# Patient Record
Sex: Male | Born: 1945
Health system: Southern US, Community
[De-identification: ages and names within clinical notes are randomized; demographics above are authoritative.]

## PROBLEM LIST (undated history)

## (undated) DIAGNOSIS — K219 Gastro-esophageal reflux disease without esophagitis: Secondary | ICD-10-CM

## (undated) DIAGNOSIS — E119 Type 2 diabetes mellitus without complications: Secondary | ICD-10-CM

## (undated) DIAGNOSIS — E785 Hyperlipidemia, unspecified: Secondary | ICD-10-CM

## (undated) DIAGNOSIS — I10 Essential (primary) hypertension: Secondary | ICD-10-CM

## (undated) HISTORY — DX: Type 2 diabetes mellitus without complications: E11.9

## (undated) HISTORY — PX: CARDIAC CATHETERIZATION: SHX172

## (undated) HISTORY — DX: Essential (primary) hypertension: I10

## (undated) HISTORY — PX: TONSILLECTOMY: SUR1361

## (undated) HISTORY — PX: HERNIA REPAIR: SHX51

## (undated) HISTORY — DX: Hyperlipidemia, unspecified: E78.5

## (undated) HISTORY — DX: Gastro-esophageal reflux disease without esophagitis: K21.9

## (undated) SURGERY — Surgical Case
Anesthesia: *Unknown

---

## 2005-02-03 ENCOUNTER — Ambulatory Visit: Payer: Self-pay | Admitting: Unknown Physician Specialty

## 2005-10-03 ENCOUNTER — Emergency Department: Payer: Self-pay | Admitting: Emergency Medicine

## 2006-07-28 ENCOUNTER — Emergency Department: Payer: Self-pay | Admitting: Emergency Medicine

## 2006-12-09 ENCOUNTER — Ambulatory Visit: Payer: Self-pay | Admitting: Cardiology

## 2007-05-05 ENCOUNTER — Ambulatory Visit: Payer: Self-pay | Admitting: Family Medicine

## 2007-10-17 ENCOUNTER — Ambulatory Visit: Payer: Self-pay | Admitting: Internal Medicine

## 2007-10-19 ENCOUNTER — Ambulatory Visit: Payer: Self-pay | Admitting: Internal Medicine

## 2008-11-28 ENCOUNTER — Ambulatory Visit: Payer: Self-pay | Admitting: Family Medicine

## 2012-04-19 DIAGNOSIS — Z79899 Other long term (current) drug therapy: Secondary | ICD-10-CM | POA: Insufficient documentation

## 2012-04-19 DIAGNOSIS — E291 Testicular hypofunction: Secondary | ICD-10-CM | POA: Insufficient documentation

## 2012-04-19 DIAGNOSIS — R3129 Other microscopic hematuria: Secondary | ICD-10-CM | POA: Insufficient documentation

## 2012-04-19 DIAGNOSIS — R972 Elevated prostate specific antigen [PSA]: Secondary | ICD-10-CM | POA: Insufficient documentation

## 2012-04-19 DIAGNOSIS — D075 Carcinoma in situ of prostate: Secondary | ICD-10-CM | POA: Insufficient documentation

## 2012-10-18 DIAGNOSIS — N5319 Other ejaculatory dysfunction: Secondary | ICD-10-CM | POA: Insufficient documentation

## 2012-10-18 DIAGNOSIS — N5235 Erectile dysfunction following radiation therapy: Secondary | ICD-10-CM | POA: Insufficient documentation

## 2012-11-09 ENCOUNTER — Ambulatory Visit: Payer: Self-pay | Admitting: Physician Assistant

## 2012-12-31 ENCOUNTER — Ambulatory Visit: Payer: Self-pay | Admitting: Oncology

## 2013-01-04 ENCOUNTER — Ambulatory Visit: Payer: Self-pay | Admitting: Oncology

## 2013-01-04 LAB — CBC CANCER CENTER
Eosinophil #: 0.3 x10 3/mm (ref 0.0–0.7)
HCT: 54.9 % — ABNORMAL HIGH (ref 40.0–52.0)
HGB: 18.4 g/dL — ABNORMAL HIGH (ref 13.0–18.0)
MCHC: 33.6 g/dL (ref 32.0–36.0)
MCV: 81 fL (ref 80–100)
Monocyte %: 8.7 %
Neutrophil #: 4.1 x10 3/mm (ref 1.4–6.5)
RDW: 14.3 % (ref 11.5–14.5)
WBC: 7.2 x10 3/mm (ref 3.8–10.6)

## 2013-01-04 LAB — IRON AND TIBC
Iron Bind.Cap.(Total): 398 ug/dL (ref 250–450)
Iron Saturation: 33 %
Iron: 132 ug/dL (ref 65–175)

## 2013-01-04 LAB — FERRITIN: Ferritin (ARMC): 54 ng/mL (ref 8–388)

## 2013-02-02 ENCOUNTER — Ambulatory Visit: Payer: Self-pay | Admitting: Oncology

## 2014-01-31 ENCOUNTER — Ambulatory Visit: Payer: Self-pay | Admitting: Oncology

## 2014-02-03 ENCOUNTER — Ambulatory Visit: Payer: Self-pay | Admitting: Oncology

## 2014-02-03 LAB — CBC CANCER CENTER
Basophil #: 0 x10 3/mm (ref 0.0–0.1)
Basophil %: 0.7 %
EOS PCT: 4.6 %
Eosinophil #: 0.3 x10 3/mm (ref 0.0–0.7)
HCT: 56.2 % — AB (ref 40.0–52.0)
HGB: 17.8 g/dL (ref 13.0–18.0)
LYMPHS PCT: 34.8 %
Lymphocyte #: 2.1 x10 3/mm (ref 1.0–3.6)
MCH: 25.7 pg — AB (ref 26.0–34.0)
MCHC: 31.8 g/dL — AB (ref 32.0–36.0)
MCV: 81 fL (ref 80–100)
Monocyte #: 0.6 x10 3/mm (ref 0.2–1.0)
Monocyte %: 10 %
NEUTROS ABS: 3.1 x10 3/mm (ref 1.4–6.5)
Neutrophil %: 49.9 %
Platelet: 185 x10 3/mm (ref 150–440)
RBC: 6.94 10*6/uL — AB (ref 4.40–5.90)
RDW: 14.5 % (ref 11.5–14.5)
WBC: 6.2 x10 3/mm (ref 3.8–10.6)

## 2014-02-03 LAB — IRON AND TIBC
Iron Bind.Cap.(Total): 370 ug/dL (ref 250–450)
Iron Saturation: 19 %
Iron: 72 ug/dL (ref 65–175)
Unbound Iron-Bind.Cap.: 298 ug/dL

## 2014-02-03 LAB — FERRITIN: Ferritin (ARMC): 35 ng/mL (ref 8–388)

## 2014-03-05 ENCOUNTER — Ambulatory Visit: Payer: Self-pay | Admitting: Oncology

## 2014-03-24 LAB — CBC CANCER CENTER
Basophil #: 0 x10 3/mm (ref 0.0–0.1)
Basophil %: 0.8 %
EOS PCT: 4 %
Eosinophil #: 0.2 x10 3/mm (ref 0.0–0.7)
HCT: 50.6 % (ref 40.0–52.0)
HGB: 16.4 g/dL (ref 13.0–18.0)
LYMPHS ABS: 1.9 x10 3/mm (ref 1.0–3.6)
Lymphocyte %: 38.3 %
MCH: 26.5 pg (ref 26.0–34.0)
MCHC: 32.5 g/dL (ref 32.0–36.0)
MCV: 82 fL (ref 80–100)
Monocyte #: 0.4 x10 3/mm (ref 0.2–1.0)
Monocyte %: 8.4 %
NEUTROS ABS: 2.4 x10 3/mm (ref 1.4–6.5)
Neutrophil %: 48.5 %
Platelet: 149 x10 3/mm — ABNORMAL LOW (ref 150–440)
RBC: 6.21 10*6/uL — ABNORMAL HIGH (ref 4.40–5.90)
RDW: 15.4 % — ABNORMAL HIGH (ref 11.5–14.5)
WBC: 4.9 x10 3/mm (ref 3.8–10.6)

## 2014-04-04 ENCOUNTER — Ambulatory Visit: Payer: Self-pay | Admitting: Oncology

## 2014-08-03 ENCOUNTER — Ambulatory Visit
Admit: 2014-08-03 | Disposition: A | Discharge status: Home / Self Care | Payer: Self-pay | Attending: Oncology | Admitting: Oncology

## 2014-08-03 LAB — CBC CANCER CENTER
Basophil #: 0.1 x10 3/mm (ref 0.0–0.1)
Basophil %: 1.2 %
EOS ABS: 0.2 x10 3/mm (ref 0.0–0.7)
EOS PCT: 3.8 %
HCT: 45.2 % (ref 40.0–52.0)
HGB: 15.5 g/dL (ref 13.0–18.0)
Lymphocyte #: 2.2 x10 3/mm (ref 1.0–3.6)
Lymphocyte %: 39.6 %
MCH: 27.9 pg (ref 26.0–34.0)
MCHC: 34.2 g/dL (ref 32.0–36.0)
MCV: 82 fL (ref 80–100)
MONOS PCT: 8.9 %
Monocyte #: 0.5 x10 3/mm (ref 0.2–1.0)
NEUTROS ABS: 2.6 x10 3/mm (ref 1.4–6.5)
Neutrophil %: 46.5 %
Platelet: 200 x10 3/mm (ref 150–440)
RBC: 5.55 10*6/uL (ref 4.40–5.90)
RDW: 13.8 % (ref 11.5–14.5)
WBC: 5.5 x10 3/mm (ref 3.8–10.6)

## 2014-08-04 ENCOUNTER — Ambulatory Visit
Admit: 2014-08-04 | Disposition: A | Discharge status: Home / Self Care | Payer: Self-pay | Attending: Oncology | Admitting: Oncology

## 2015-02-16 DIAGNOSIS — R972 Elevated prostate specific antigen [PSA]: Secondary | ICD-10-CM | POA: Diagnosis not present

## 2015-02-16 DIAGNOSIS — N4231 Prostatic intraepithelial neoplasia: Secondary | ICD-10-CM | POA: Diagnosis not present

## 2015-02-16 DIAGNOSIS — R3121 Asymptomatic microscopic hematuria: Secondary | ICD-10-CM | POA: Diagnosis not present

## 2015-02-16 DIAGNOSIS — Z79899 Other long term (current) drug therapy: Secondary | ICD-10-CM | POA: Diagnosis not present

## 2015-02-16 DIAGNOSIS — Z86008 Personal history of in-situ neoplasm of other site: Secondary | ICD-10-CM | POA: Diagnosis not present

## 2015-02-16 DIAGNOSIS — E291 Testicular hypofunction: Secondary | ICD-10-CM | POA: Diagnosis not present

## 2015-02-16 DIAGNOSIS — N529 Male erectile dysfunction, unspecified: Secondary | ICD-10-CM | POA: Diagnosis not present

## 2015-02-19 DIAGNOSIS — Z23 Encounter for immunization: Secondary | ICD-10-CM | POA: Diagnosis not present

## 2015-02-19 DIAGNOSIS — E1165 Type 2 diabetes mellitus with hyperglycemia: Secondary | ICD-10-CM | POA: Diagnosis not present

## 2015-02-19 DIAGNOSIS — I119 Hypertensive heart disease without heart failure: Secondary | ICD-10-CM | POA: Diagnosis not present

## 2015-02-20 DIAGNOSIS — I119 Hypertensive heart disease without heart failure: Secondary | ICD-10-CM | POA: Diagnosis not present

## 2015-02-20 DIAGNOSIS — I517 Cardiomegaly: Secondary | ICD-10-CM | POA: Diagnosis not present

## 2015-02-20 DIAGNOSIS — M109 Gout, unspecified: Secondary | ICD-10-CM | POA: Diagnosis not present

## 2015-03-06 DIAGNOSIS — M109 Gout, unspecified: Secondary | ICD-10-CM | POA: Diagnosis not present

## 2015-03-06 DIAGNOSIS — E1159 Type 2 diabetes mellitus with other circulatory complications: Secondary | ICD-10-CM | POA: Diagnosis not present

## 2015-03-06 DIAGNOSIS — I119 Hypertensive heart disease without heart failure: Secondary | ICD-10-CM | POA: Diagnosis not present

## 2015-03-06 DIAGNOSIS — G4710 Hypersomnia, unspecified: Secondary | ICD-10-CM | POA: Diagnosis not present

## 2015-03-06 DIAGNOSIS — D751 Secondary polycythemia: Secondary | ICD-10-CM | POA: Diagnosis not present

## 2015-03-09 DIAGNOSIS — E291 Testicular hypofunction: Secondary | ICD-10-CM | POA: Diagnosis not present

## 2015-04-23 DIAGNOSIS — E291 Testicular hypofunction: Secondary | ICD-10-CM | POA: Diagnosis not present

## 2015-07-11 DIAGNOSIS — E291 Testicular hypofunction: Secondary | ICD-10-CM | POA: Diagnosis not present

## 2015-07-11 DIAGNOSIS — M109 Gout, unspecified: Secondary | ICD-10-CM | POA: Diagnosis not present

## 2015-07-11 DIAGNOSIS — I119 Hypertensive heart disease without heart failure: Secondary | ICD-10-CM | POA: Diagnosis not present

## 2015-07-11 DIAGNOSIS — E1165 Type 2 diabetes mellitus with hyperglycemia: Secondary | ICD-10-CM | POA: Diagnosis not present

## 2015-08-15 DIAGNOSIS — R05 Cough: Secondary | ICD-10-CM | POA: Diagnosis not present

## 2015-08-15 DIAGNOSIS — I10 Essential (primary) hypertension: Secondary | ICD-10-CM | POA: Diagnosis not present

## 2015-08-15 DIAGNOSIS — J1089 Influenza due to other identified influenza virus with other manifestations: Secondary | ICD-10-CM | POA: Diagnosis not present

## 2015-08-15 DIAGNOSIS — J069 Acute upper respiratory infection, unspecified: Secondary | ICD-10-CM | POA: Diagnosis not present

## 2015-11-26 DIAGNOSIS — Z79899 Other long term (current) drug therapy: Secondary | ICD-10-CM | POA: Diagnosis not present

## 2015-11-26 DIAGNOSIS — R972 Elevated prostate specific antigen [PSA]: Secondary | ICD-10-CM | POA: Diagnosis not present

## 2015-11-26 DIAGNOSIS — E291 Testicular hypofunction: Secondary | ICD-10-CM | POA: Diagnosis not present

## 2015-12-10 DIAGNOSIS — R972 Elevated prostate specific antigen [PSA]: Secondary | ICD-10-CM | POA: Diagnosis not present

## 2015-12-10 DIAGNOSIS — D075 Carcinoma in situ of prostate: Secondary | ICD-10-CM | POA: Diagnosis not present

## 2015-12-14 DIAGNOSIS — R972 Elevated prostate specific antigen [PSA]: Secondary | ICD-10-CM | POA: Diagnosis not present

## 2015-12-14 DIAGNOSIS — D075 Carcinoma in situ of prostate: Secondary | ICD-10-CM | POA: Diagnosis not present

## 2016-01-17 DIAGNOSIS — I10 Essential (primary) hypertension: Secondary | ICD-10-CM | POA: Diagnosis not present

## 2016-01-17 DIAGNOSIS — E1165 Type 2 diabetes mellitus with hyperglycemia: Secondary | ICD-10-CM | POA: Diagnosis not present

## 2016-01-17 DIAGNOSIS — E291 Testicular hypofunction: Secondary | ICD-10-CM | POA: Diagnosis not present

## 2016-01-17 DIAGNOSIS — Z0001 Encounter for general adult medical examination with abnormal findings: Secondary | ICD-10-CM | POA: Diagnosis not present

## 2016-01-17 DIAGNOSIS — E782 Mixed hyperlipidemia: Secondary | ICD-10-CM | POA: Diagnosis not present

## 2016-01-17 DIAGNOSIS — D751 Secondary polycythemia: Secondary | ICD-10-CM | POA: Diagnosis not present

## 2016-01-17 DIAGNOSIS — I119 Hypertensive heart disease without heart failure: Secondary | ICD-10-CM | POA: Diagnosis not present

## 2016-02-12 DIAGNOSIS — R972 Elevated prostate specific antigen [PSA]: Secondary | ICD-10-CM | POA: Diagnosis not present

## 2016-02-12 DIAGNOSIS — C61 Malignant neoplasm of prostate: Secondary | ICD-10-CM | POA: Diagnosis not present

## 2016-02-20 DIAGNOSIS — I119 Hypertensive heart disease without heart failure: Secondary | ICD-10-CM | POA: Diagnosis not present

## 2016-02-21 DIAGNOSIS — N182 Chronic kidney disease, stage 2 (mild): Secondary | ICD-10-CM | POA: Diagnosis not present

## 2016-02-21 DIAGNOSIS — I119 Hypertensive heart disease without heart failure: Secondary | ICD-10-CM | POA: Diagnosis not present

## 2016-02-21 DIAGNOSIS — E782 Mixed hyperlipidemia: Secondary | ICD-10-CM | POA: Diagnosis not present

## 2016-02-21 DIAGNOSIS — E291 Testicular hypofunction: Secondary | ICD-10-CM | POA: Diagnosis not present

## 2016-02-21 DIAGNOSIS — M109 Gout, unspecified: Secondary | ICD-10-CM | POA: Diagnosis not present

## 2016-03-17 DIAGNOSIS — C61 Malignant neoplasm of prostate: Secondary | ICD-10-CM | POA: Diagnosis not present

## 2016-04-14 DIAGNOSIS — I10 Essential (primary) hypertension: Secondary | ICD-10-CM | POA: Diagnosis not present

## 2016-04-14 DIAGNOSIS — Z23 Encounter for immunization: Secondary | ICD-10-CM | POA: Diagnosis not present

## 2016-04-14 DIAGNOSIS — E119 Type 2 diabetes mellitus without complications: Secondary | ICD-10-CM | POA: Diagnosis not present

## 2016-04-14 DIAGNOSIS — M109 Gout, unspecified: Secondary | ICD-10-CM | POA: Diagnosis not present

## 2016-04-22 DIAGNOSIS — E119 Type 2 diabetes mellitus without complications: Secondary | ICD-10-CM | POA: Diagnosis not present

## 2016-04-22 DIAGNOSIS — K219 Gastro-esophageal reflux disease without esophagitis: Secondary | ICD-10-CM | POA: Diagnosis not present

## 2016-04-22 DIAGNOSIS — I10 Essential (primary) hypertension: Secondary | ICD-10-CM | POA: Diagnosis not present

## 2016-04-22 DIAGNOSIS — M109 Gout, unspecified: Secondary | ICD-10-CM | POA: Diagnosis not present

## 2016-04-22 DIAGNOSIS — M67442 Ganglion, left hand: Secondary | ICD-10-CM | POA: Diagnosis not present

## 2016-05-06 DIAGNOSIS — M67441 Ganglion, right hand: Secondary | ICD-10-CM | POA: Diagnosis not present

## 2016-05-06 DIAGNOSIS — M79644 Pain in right finger(s): Secondary | ICD-10-CM | POA: Diagnosis not present

## 2016-05-06 DIAGNOSIS — M1A0411 Idiopathic chronic gout, right hand, with tophus (tophi): Secondary | ICD-10-CM | POA: Diagnosis not present

## 2016-05-06 DIAGNOSIS — G8929 Other chronic pain: Secondary | ICD-10-CM | POA: Diagnosis not present

## 2016-06-04 DIAGNOSIS — M1A9XX1 Chronic gout, unspecified, with tophus (tophi): Secondary | ICD-10-CM | POA: Diagnosis not present

## 2016-06-04 DIAGNOSIS — M67441 Ganglion, right hand: Secondary | ICD-10-CM | POA: Diagnosis not present

## 2016-06-04 DIAGNOSIS — L942 Calcinosis cutis: Secondary | ICD-10-CM | POA: Diagnosis not present

## 2016-06-09 DIAGNOSIS — C61 Malignant neoplasm of prostate: Secondary | ICD-10-CM | POA: Diagnosis not present

## 2016-06-16 DIAGNOSIS — Z510 Encounter for antineoplastic radiation therapy: Secondary | ICD-10-CM | POA: Diagnosis not present

## 2016-06-16 DIAGNOSIS — C61 Malignant neoplasm of prostate: Secondary | ICD-10-CM | POA: Diagnosis not present

## 2016-06-17 DIAGNOSIS — Z510 Encounter for antineoplastic radiation therapy: Secondary | ICD-10-CM | POA: Diagnosis not present

## 2016-06-17 DIAGNOSIS — C61 Malignant neoplasm of prostate: Secondary | ICD-10-CM | POA: Diagnosis not present

## 2016-06-26 DIAGNOSIS — C61 Malignant neoplasm of prostate: Secondary | ICD-10-CM | POA: Insufficient documentation

## 2016-06-30 DIAGNOSIS — C61 Malignant neoplasm of prostate: Secondary | ICD-10-CM | POA: Diagnosis not present

## 2016-06-30 DIAGNOSIS — Z510 Encounter for antineoplastic radiation therapy: Secondary | ICD-10-CM | POA: Diagnosis not present

## 2016-07-01 DIAGNOSIS — C61 Malignant neoplasm of prostate: Secondary | ICD-10-CM | POA: Diagnosis not present

## 2016-07-01 DIAGNOSIS — Z510 Encounter for antineoplastic radiation therapy: Secondary | ICD-10-CM | POA: Diagnosis not present

## 2016-07-02 ENCOUNTER — Encounter: Payer: Self-pay | Admitting: Family Medicine

## 2016-07-02 DIAGNOSIS — C61 Malignant neoplasm of prostate: Secondary | ICD-10-CM | POA: Diagnosis not present

## 2016-07-02 DIAGNOSIS — Z510 Encounter for antineoplastic radiation therapy: Secondary | ICD-10-CM | POA: Diagnosis not present

## 2016-07-03 DIAGNOSIS — C61 Malignant neoplasm of prostate: Secondary | ICD-10-CM | POA: Diagnosis not present

## 2016-07-03 DIAGNOSIS — Z510 Encounter for antineoplastic radiation therapy: Secondary | ICD-10-CM | POA: Diagnosis not present

## 2016-07-24 DIAGNOSIS — D075 Carcinoma in situ of prostate: Secondary | ICD-10-CM | POA: Diagnosis not present

## 2016-07-24 DIAGNOSIS — C61 Malignant neoplasm of prostate: Secondary | ICD-10-CM | POA: Diagnosis not present

## 2016-07-24 DIAGNOSIS — Z510 Encounter for antineoplastic radiation therapy: Secondary | ICD-10-CM | POA: Diagnosis not present

## 2016-07-29 DIAGNOSIS — H2513 Age-related nuclear cataract, bilateral: Secondary | ICD-10-CM | POA: Diagnosis not present

## 2016-07-29 DIAGNOSIS — H524 Presbyopia: Secondary | ICD-10-CM | POA: Diagnosis not present

## 2016-07-29 DIAGNOSIS — H401112 Primary open-angle glaucoma, right eye, moderate stage: Secondary | ICD-10-CM | POA: Diagnosis not present

## 2016-08-04 DIAGNOSIS — E119 Type 2 diabetes mellitus without complications: Secondary | ICD-10-CM | POA: Diagnosis not present

## 2016-08-04 DIAGNOSIS — I10 Essential (primary) hypertension: Secondary | ICD-10-CM | POA: Diagnosis not present

## 2016-08-04 DIAGNOSIS — E782 Mixed hyperlipidemia: Secondary | ICD-10-CM | POA: Diagnosis not present

## 2016-08-04 DIAGNOSIS — M109 Gout, unspecified: Secondary | ICD-10-CM | POA: Diagnosis not present

## 2016-08-04 DIAGNOSIS — R609 Edema, unspecified: Secondary | ICD-10-CM | POA: Diagnosis not present

## 2016-10-01 DIAGNOSIS — H401112 Primary open-angle glaucoma, right eye, moderate stage: Secondary | ICD-10-CM | POA: Diagnosis not present

## 2016-11-13 DIAGNOSIS — M109 Gout, unspecified: Secondary | ICD-10-CM | POA: Diagnosis not present

## 2016-11-13 DIAGNOSIS — I10 Essential (primary) hypertension: Secondary | ICD-10-CM | POA: Diagnosis not present

## 2016-11-13 DIAGNOSIS — E119 Type 2 diabetes mellitus without complications: Secondary | ICD-10-CM | POA: Diagnosis not present

## 2016-11-13 DIAGNOSIS — K219 Gastro-esophageal reflux disease without esophagitis: Secondary | ICD-10-CM | POA: Diagnosis not present

## 2017-01-01 DIAGNOSIS — R39198 Other difficulties with micturition: Secondary | ICD-10-CM | POA: Diagnosis not present

## 2017-01-01 DIAGNOSIS — Z923 Personal history of irradiation: Secondary | ICD-10-CM | POA: Diagnosis not present

## 2017-01-01 DIAGNOSIS — R232 Flushing: Secondary | ICD-10-CM | POA: Diagnosis not present

## 2017-01-01 DIAGNOSIS — R195 Other fecal abnormalities: Secondary | ICD-10-CM | POA: Diagnosis not present

## 2017-01-01 DIAGNOSIS — C61 Malignant neoplasm of prostate: Secondary | ICD-10-CM | POA: Diagnosis not present

## 2017-01-01 DIAGNOSIS — R5383 Other fatigue: Secondary | ICD-10-CM | POA: Diagnosis not present

## 2017-01-01 DIAGNOSIS — Z79899 Other long term (current) drug therapy: Secondary | ICD-10-CM | POA: Diagnosis not present

## 2017-01-01 DIAGNOSIS — R3915 Urgency of urination: Secondary | ICD-10-CM | POA: Diagnosis not present

## 2017-02-25 ENCOUNTER — Encounter: Payer: Self-pay | Admitting: Urology

## 2017-02-25 ENCOUNTER — Ambulatory Visit: Payer: Medicare Other | Admitting: Urology

## 2017-02-25 VITALS — BP 145/81 | HR 65 | Ht 75.5 in | Wt 263.2 lb

## 2017-02-25 DIAGNOSIS — E291 Testicular hypofunction: Secondary | ICD-10-CM

## 2017-02-25 DIAGNOSIS — N529 Male erectile dysfunction, unspecified: Secondary | ICD-10-CM | POA: Diagnosis not present

## 2017-02-25 DIAGNOSIS — C61 Malignant neoplasm of prostate: Secondary | ICD-10-CM | POA: Diagnosis not present

## 2017-02-25 MED ORDER — TADALAFIL 5 MG PO TABS: 6.0000 mg | ORAL_TABLET | Freq: Every day | ORAL | 3 refills | Status: DC | PRN

## 2017-02-25 MED ORDER — SILDENAFIL CITRATE 20 MG PO TABS: ORAL_TABLET | ORAL | 0 refills | Status: DC

## 2017-02-25 NOTE — Progress Notes (Signed)
02/25/2017 12:26 PM   Gary Alar Sr. 05/10/1945 [id number]   Chief Complaint  Patient presents with  . Prostate Cancer    HPI: [name] is a 71 yo male with low-intermediate prostate cancer, cT1c, PSA 4.31 on finasteride (corrected 8.62), Gleason 3+4=7 in 1/12 cores (left apex) diagnosed in October 2017.  He was treated with Cyberknife radiation on 07/03/16.  He last saw a radiation oncology at [facility] on 01/01/2017 and PSA at that visit was <0.1.  Since his procedure he has had urinary frequency, urgency with occasional episodes of urge incontinence which he states is improving and not bothersome.  Denies dysuria or gross hematuria.  He is taking tamsulosin but no longer on finasteride.  He was on testosterone replacement therapy prior to his cancer diagnosis and complains of tiredness and fatigue.  He also been on low-dose tadalafil and low-dose sildenafil for erectile dysfunction which he was alternating.  PMH: History reviewed. No pertinent past medical history.  Surgical History: History reviewed. No pertinent surgical history.  Home Medications:  Allergies as of 02/25/2017   No Known Allergies     Medication List       Accurate as of 02/25/17 12:26 PM. Always use your most recent med list.          allopurinol 300 MG tablet Commonly known as:  ZYLOPRIM Take 1 tablet by mouth at bedtime.   amLODipine 10 MG tablet Commonly known as:  NORVASC Take 1 tablet by mouth at bedtime.   carvedilol 12.5 MG tablet Commonly known as:  COREG Take 12.5 mg by mouth 2 (two) times daily with a meal.   colchicine 0.6 MG tablet Take 0.6 mg by mouth daily.   furosemide 20 MG tablet Commonly known as:  LASIX Take 1 tablet by mouth as needed.   losartan-hydrochlorothiazide 100-12.5 MG tablet Commonly known as:  HYZAAR Take 1 tablet by mouth daily.   tamsulosin 0.4 MG Caps capsule Commonly known as:  FLOMAX Take 1 capsule by mouth daily.       Allergies: No Known  Allergies  Family History: Family History  Problem Relation Age of Onset  . Cirrhosis Father   . Bone cancer Mother     Social History:  reports that he has never smoked. He has never used smokeless tobacco. His alcohol and drug histories are not on file.  ROS: UROLOGY Frequent Urination?: Yes Hard to postpone urination?: No Burning/pain with urination?: No Get up at night to urinate?: Yes Leakage of urine?: Yes Urine stream starts and stops?: No Trouble starting stream?: Yes Do you have to strain to urinate?: No Blood in urine?: No Urinary tract infection?: No Sexually transmitted disease?: No Injury to kidneys or bladder?: No Painful intercourse?: No Weak stream?: Yes Erection problems?: Yes Penile pain?: No  Gastrointestinal Nausea?: Yes Vomiting?: No Indigestion/heartburn?: Yes Diarrhea?: No Constipation?: No  Constitutional Fever: No Night sweats?: No Weight loss?: No Fatigue?: No  Skin Skin rash/lesions?: No Itching?: No  Eyes Blurred vision?: No Double vision?: No  Ears/Nose/Throat Sore throat?: No Sinus problems?: No  Hematologic/Lymphatic Swollen glands?: No Easy bruising?: No  Cardiovascular Leg swelling?: No Chest pain?: No  Respiratory Cough?: No Shortness of breath?: No  Endocrine Excessive thirst?: No  Musculoskeletal Back pain?: No Joint pain?: No  Neurological Headaches?: No Dizziness?: No  Psychologic Depression?: No Anxiety?: No  Physical Exam: BP (!) 145/81 (BP Location: Right Arm, Patient Position: Sitting, Cuff Size: Large)   Pulse 65   Ht 6'  3.5" (1.918 m)   Wt 263 lb 3.2 oz (119.4 kg)   BMI 32.46 kg/m   Constitutional:  Alert and oriented, No acute distress. HEENT: [place] AT, moist mucus membranes.  Trachea midline, no masses. Cardiovascular: No clubbing, cyanosis, or edema. Respiratory: Normal respiratory effort, no increased work of breathing. GI: Abdomen is soft, nontender, nondistended, no abdominal  masses GU: No CVA tenderness.  Skin: No rashes, bruises or suspicious lesions. Lymph: No cervical or inguinal adenopathy. Neurologic: Grossly intact, no focal deficits, moving all 4 extremities. Psychiatric: Normal mood and affect.  Laboratory Data: Lab Results  Component Value Date   WBC 5.5 08/03/2014   HGB 15.5 08/03/2014   HCT 45.2 08/03/2014   MCV 82 08/03/2014   PLT 200 08/03/2014    Assessment & Plan:    1. Prostate cancer [facility]) Last PSA was late August 2018 and he will be due for a follow-up PSA in late November.  Recommend follow-up office visit February/March 2019.  - PSA  2. Erectile dysfunction Low dose sildenafil and tadalafil were refilled  3.  Hypogonadism Currently not a candidate for restarting testosterone replacement.  Will check his testosterone at that time of his PSA.  Return in about 3 months (around 05/28/2017) for Recheck and PSA.  [name], [place] [address] [place], [place] [address] [phone]

## 2017-03-11 ENCOUNTER — Other Ambulatory Visit: Payer: Self-pay

## 2017-03-11 MED ORDER — SILDENAFIL CITRATE 100 MG PO TABS: 100.0000 mg | ORAL_TABLET | Freq: Every day | ORAL | 0 refills | Status: DC | PRN

## 2017-03-11 MED ORDER — TADALAFIL 5 MG PO TABS: 6.0000 mg | ORAL_TABLET | Freq: Every day | ORAL | 3 refills | Status: DC | PRN

## 2017-03-23 ENCOUNTER — Other Ambulatory Visit: Payer: Self-pay

## 2017-03-23 MED ORDER — SILDENAFIL CITRATE 100 MG PO TABS: 100.0000 mg | ORAL_TABLET | Freq: Every day | ORAL | 6 refills | Status: DC | PRN

## 2017-04-01 ENCOUNTER — Other Ambulatory Visit: Payer: Medicare Other

## 2017-04-01 DIAGNOSIS — E291 Testicular hypofunction: Secondary | ICD-10-CM | POA: Diagnosis not present

## 2017-04-02 LAB — TESTOSTERONE: Testosterone: 63 ng/dL — ABNORMAL LOW (ref 264–916)

## 2017-04-03 ENCOUNTER — Telehealth: Payer: Self-pay

## 2017-04-03 NOTE — Telephone Encounter (Signed)
LMOM- testosterone 63. Not a candidate for replacement therapy.

## 2017-04-03 NOTE — Telephone Encounter (Signed)
-----   Message from [name], MD sent at 04/02/2017  9:50 AM EST ----- Testosterone level was 63.  With his recent prostate cancer treatment he is currently not a candidate for testosterone replacement.

## 2017-05-18 ENCOUNTER — Ambulatory Visit (INDEPENDENT_AMBULATORY_CARE_PROVIDER_SITE_OTHER): Payer: Medicare Other | Admitting: Nurse Practitioner

## 2017-05-18 ENCOUNTER — Encounter: Payer: Self-pay | Admitting: Nurse Practitioner

## 2017-05-18 VITALS — BP 150/82 | HR 61 | Resp 16 | Ht 75.5 in | Wt 262.0 lb

## 2017-05-18 DIAGNOSIS — Z23 Encounter for immunization: Secondary | ICD-10-CM | POA: Diagnosis not present

## 2017-05-18 DIAGNOSIS — Z0001 Encounter for general adult medical examination with abnormal findings: Secondary | ICD-10-CM

## 2017-05-18 DIAGNOSIS — E1165 Type 2 diabetes mellitus with hyperglycemia: Secondary | ICD-10-CM | POA: Diagnosis not present

## 2017-05-18 DIAGNOSIS — I10 Essential (primary) hypertension: Secondary | ICD-10-CM | POA: Insufficient documentation

## 2017-05-18 DIAGNOSIS — K219 Gastro-esophageal reflux disease without esophagitis: Secondary | ICD-10-CM | POA: Insufficient documentation

## 2017-05-18 DIAGNOSIS — J309 Allergic rhinitis, unspecified: Secondary | ICD-10-CM

## 2017-05-18 LAB — POCT GLYCOSYLATED HEMOGLOBIN (HGB A1C): Hemoglobin A1C: 7.5

## 2017-05-18 NOTE — Progress Notes (Signed)
[facility] [place], [place] [address]  Internal MEDICINE  Office Visit Note  Patient Name: [name]  [id number]  [id number]  Date of Service: 05/18/2017     Complaints/HPI Pt is here for routine health maintenance examination  Chief Complaint  Patient presents with  . Hoarse   Blood sugars a little elevated recently. States that he did not make such wise diet choices over the holiday season. He states that otherwise, he feels pretty good.    Other  This is a new problem. The current episode started in the past 7 days. The problem occurs constantly. The problem has been unchanged. Associated symptoms include congestion, coughing and fatigue. Pertinent negatives include no chest pain, chills, fever, headaches, nausea, numbness, sore throat, vomiting or weakness. Associated symptoms comments: Post-nasal drip. Nothing aggravates the symptoms. He has tried nothing for the symptoms. The treatment provided no relief.    Current Medication: Outpatient Encounter Medications as of 05/18/2017  Medication Sig  . allopurinol (ZYLOPRIM) 300 MG tablet Take 1 tablet by mouth at bedtime.  Marland Kitchen amLODipine (NORVASC) 10 MG tablet Take 1 tablet by mouth at bedtime.  . carvedilol (COREG) 12.5 MG tablet Take 12.5 mg by mouth 2 (two) times daily with a meal.  . colchicine 0.6 MG tablet Take 0.6 mg by mouth daily.  . furosemide (LASIX) 20 MG tablet Take 1 tablet by mouth as needed.  Marland Kitchen losartan-hydrochlorothiazide (HYZAAR) 100-12.5 MG tablet Take 1 tablet by mouth daily.  . tadalafil (CIALIS) 5 MG tablet Take 1 tablet (5 mg total) daily as needed by mouth for erectile dysfunction.  . tamsulosin (FLOMAX) 0.4 MG CAPS capsule Take 1 capsule by mouth daily.  . sildenafil (REVATIO) 20 MG tablet 1-3 tablets 1 hour prior to intercourse (Patient not taking: Reported on 05/18/2017)  . sildenafil (VIAGRA) 100 MG tablet Take 1 tablet (100 mg total) daily as needed by mouth for  erectile dysfunction. (Patient not taking: Reported on 05/18/2017)   No facility-administered encounter medications on file as of 05/18/2017.     Surgical History: Past Surgical History:  Procedure Laterality Date  . HERNIA REPAIR    . TONSILLECTOMY Bilateral    1992    Medical History: Past Medical History:  Diagnosis Date  . Diabetes mellitus without complication ([place])   . GERD (gastroesophageal reflux disease)   . Hyperlipidemia   . Hypertension     Family History: Family History  Problem Relation Age of Onset  . Cirrhosis Father   . Bone cancer Mother       Review of Systems  Constitutional: Positive for fatigue. Negative for activity change, appetite change, chills and fever.  HENT: Positive for congestion, postnasal drip and voice change. Negative for sore throat.   Eyes: Negative.   Respiratory: Positive for cough. Negative for shortness of breath and wheezing.   Cardiovascular: Negative for chest pain and palpitations.  Gastrointestinal: Negative for constipation, diarrhea, nausea and vomiting.  Endocrine:       Blood sugars have been elevated recently  Genitourinary:       Sees Dr. Bernardo Heater, urlogy, for history of prostate cancer.   Musculoskeletal: Negative for back pain.  Skin: Negative.   Allergic/Immunologic: Negative.   Neurological: Negative for weakness, numbness and headaches.  Hematological: Negative.   Psychiatric/Behavioral: Negative for behavioral problems. The patient is not nervous/anxious.      Today's Vitals   05/18/17 0922  BP: (!) 150/82  Pulse: 61  Resp: 16  SpO2:  96%  Weight: 262 lb (118.8 kg)  Height: 6' 3.5" (1.918 m)    Physical Exam  Constitutional: He is oriented to person, place, and time. He appears well-developed and well-nourished.  HENT:  Head: Normocephalic.  Eyes: Pupils are equal, round, and reactive to light.  Neck: Normal range of motion. Neck supple.  Cardiovascular: Normal rate and regular rhythm.   Pulmonary/Chest: Effort normal and breath sounds normal.  Abdominal: Soft. There is no tenderness.  Musculoskeletal: Normal range of motion.  Neurological: He is alert and oriented to person, place, and time.  Skin: Skin is warm and dry.  Psychiatric: He has a normal mood and affect.     LABS: Recent Results (from the past 2160 hour(s))  Testosterone     Status: Abnormal   Collection Time: 04/01/17  1:00 PM  Result Value Ref Range   Testosterone 63 (L) 264 - 916 ng/dL    Comment: Adult male reference interval is based on a population of healthy nonobese males (BMI <30) between 35 and 47 years old. [place], [place] [phone]. PMID: [id number].   POCT HgB A1C     Status: None   Collection Time: 05/18/17 10:20 AM  Result Value Ref Range   Hemoglobin A1C 7.5      Assessment and Plan:      ICD-10-CM   1. Encounter for general adult medical examination with abnormal findings Z00.01 Urinalysis, Routine w reflex microscopic    CBC with Differential/Platelet    Comprehensive metabolic panel    Lipid panel    TSH  2. Uncontrolled type 2 diabetes mellitus with hyperglycemia (HCC) E11.65 POCT HgB A1C    TSH    Microalbumin, urine  3. Essential hypertension I10   4. Gastroesophageal reflux disease without esophagitis K21.9   5. Flu vaccine need Z23 Flu Vaccine MDCK QUAD PF  6. Allergic rhinitis, unspecified seasonality, unspecified trigger J30.9    1. Annual wellness visit today 2. HgbA1c up to 7.5 from 6.8 at last check. Reviewed importance of following ada diet and increased routine exercise. Recheck in 3 months. Urine microalbumin ordered  3. bp stable. Continue meds as prescribed  4. Continue pantoprazole as prescribed  5. Flu vaccine administered today 6. hoarseness and post nasal drip appear to be allergic in nature. Recommend use of otc antihistamines. Monitor closely. Will send ABX as indicated.   He should follow up in 3 months and sooner if needed     Patient Active Problem List   Diagnosis Date Noted  . Hypertension 05/18/2017  . GERD (gastroesophageal reflux disease) 05/18/2017  . Prostate cancer ([place]) 06/26/2016  . Anejaculation 10/18/2012  . ED (erectile dysfunction) of organic origin 10/18/2012  . Microscopic hematuria 04/19/2012  . Testicular hypofunction 04/19/2012    General Counseling: I have discussed the findings of the evaluation and examination with [name].  I have also discussed any further diagnostic evaluation that may be needed or ordered today. [name] verbalizes understanding of the findings of todays visit. We also reviewed his medications today. he has been encouraged to call the office with any questions or concerns that should arise related to todays visit.  Diabetes Counseling:  1. Addition of ACE inh/ ARB'S for nephroprotection. 2. Diabetic foot care, prevention of complications.  3.Exercise and lose weight.  4. Diabetic eye examination, 5. Monitor blood sugar closlely. nutrition counseling.  6.Sign and symptoms of hypoglycemia including shaking sweating,confusion and headaches.  This patient was seen by Leretha Pol, FNP- C in Collaboration with Dr Latricia Heft  Berna Spare as a part of collaborative care agreement   Time spent:30 minutes     Lavera Guise, MD  Internal Medicine

## 2017-05-19 LAB — URINALYSIS, ROUTINE W REFLEX MICROSCOPIC
Bilirubin, UA: NEGATIVE
Glucose, UA: NEGATIVE
Ketones, UA: NEGATIVE
Leukocytes, UA: NEGATIVE
Nitrite, UA: NEGATIVE
Protein, UA: NEGATIVE
RBC, UA: NEGATIVE
Specific Gravity, UA: 1.016 (ref 1.005–1.030)
Urobilinogen, Ur: 0.2 mg/dL (ref 0.2–1.0)
pH, UA: 6 (ref 5.0–7.5)

## 2017-05-28 ENCOUNTER — Encounter: Payer: Self-pay | Admitting: Urology

## 2017-05-28 ENCOUNTER — Ambulatory Visit: Payer: Medicare Other | Admitting: Urology

## 2017-05-28 VITALS — BP 154/64 | HR 71 | Ht 76.0 in | Wt 262.0 lb

## 2017-05-28 DIAGNOSIS — N5235 Erectile dysfunction following radiation therapy: Secondary | ICD-10-CM

## 2017-05-28 DIAGNOSIS — C61 Malignant neoplasm of prostate: Secondary | ICD-10-CM | POA: Diagnosis not present

## 2017-05-28 NOTE — Progress Notes (Signed)
05/28/2017 1:40 PM   Gary Alar Sr. [birth year] [id number]  Referring provider: [facility] [place] [address] [place], [place] [address]  Chief Complaint  Patient presents with  . Prostate Cancer    75month    HPI: 72 year old male with low-intermediate prostate cancer, cT1c, PSA 4.31 while on finasteride, Gleason 3+4 in 1/12 cores diagnosed in October 2017.  He was treated with CyberKnife radiation in March 2018.  His last PSA was in August 2018 and was <0.1.  It is unclear if he received Lupron.  I last saw him in October and he was having frequency, urgency with occasional episodes of urge incontinence.  He states this has significantly improved.  He was on testosterone replacement therapy prior to his cancer diagnosis and does complain of progressive fatigue.  He has erectile dysfunction which has not improved with tadalafil or sildenafil.   PMH: Past Medical History:  Diagnosis Date  . Diabetes mellitus without complication ([place])   . GERD (gastroesophageal reflux disease)   . Hyperlipidemia   . Hypertension     Surgical History: Past Surgical History:  Procedure Laterality Date  . HERNIA REPAIR    . TONSILLECTOMY Bilateral    1992    Home Medications:  Allergies as of 05/28/2017   No Known Allergies     Medication List        Accurate as of 05/28/17  1:40 PM. Always use your most recent med list.          allopurinol 300 MG tablet Commonly known as:  ZYLOPRIM Take 1 tablet by mouth at bedtime.   amLODipine 10 MG tablet Commonly known as:  NORVASC Take 1 tablet by mouth at bedtime.   carvedilol 12.5 MG tablet Commonly known as:  COREG Take 12.5 mg by mouth 2 (two) times daily with a meal.   colchicine 0.6 MG tablet Take 0.6 mg by mouth daily.   furosemide 20 MG tablet Commonly known as:  LASIX Take 1 tablet by mouth as needed.   losartan-hydrochlorothiazide 100-12.5 MG tablet Commonly known as:  HYZAAR Take 1 tablet by mouth daily.   sildenafil 20 MG tablet Commonly known as:  REVATIO 1-3 tablets 1 hour prior to intercourse   tamsulosin 0.4 MG Caps capsule Commonly known as:  FLOMAX Take 1 capsule by mouth daily.       Allergies: No Known Allergies  Family History: Family History  Problem Relation Age of Onset  . Cirrhosis Father   . Bone cancer Mother     Social History:  reports that  has never smoked. he has never used smokeless tobacco. He reports that he does not drink alcohol or use drugs.  ROS: UROLOGY Frequent Urination?: No Hard to postpone urination?: No Burning/pain with urination?: No Get up at night to urinate?: No Leakage of urine?: No Urine stream starts and stops?: No Trouble starting stream?: No Do you have to strain to urinate?: No Blood in urine?: No Urinary tract infection?: No Sexually transmitted disease?: No Injury to kidneys or bladder?: No Painful intercourse?: No Weak stream?: No Erection problems?: No Penile pain?: No  Gastrointestinal Nausea?: No Vomiting?: No Indigestion/heartburn?: No Diarrhea?: No Constipation?: No  Constitutional Fever: No Night sweats?: No Weight loss?: No Fatigue?: No  Skin Skin rash/lesions?: Yes Itching?: Yes  Eyes Blurred vision?: No Double vision?: No  Ears/Nose/Throat Sore throat?: No Sinus problems?: No  Hematologic/Lymphatic Swollen glands?: No Easy bruising?: No  Cardiovascular Leg swelling?: No Chest pain?: No  Respiratory  Cough?: No Shortness of breath?: No  Endocrine Excessive thirst?: No  Musculoskeletal Back pain?: No Joint pain?: No  Neurological Headaches?: No Dizziness?: No  Psychologic Depression?: No Anxiety?: No  Physical Exam: BP (!) 154/64   Pulse 71   Ht 6\' 4"  (1.93 m)   Wt 262 lb (118.8 kg)   BMI 31.89 kg/m   Constitutional:  Alert and oriented, No acute distress. HEENT: [place] AT, moist mucus membranes.  Trachea midline, no masses. Cardiovascular: No clubbing, cyanosis, or  edema. Respiratory: Normal respiratory effort, no increased work of breathing. GI: Abdomen is soft, nontender, nondistended, no abdominal masses GU: No CVA tenderness.  Skin: No rashes, bruises or suspicious lesions. Lymph: No cervical or inguinal adenopathy. Neurologic: Grossly intact, no focal deficits, moving all 4 extremities. Psychiatric: Normal mood and affect.  Laboratory Data: Lab Results  Component Value Date   WBC 5.5 08/03/2014   HGB 15.5 08/03/2014   HCT 45.2 08/03/2014   MCV 82 08/03/2014   PLT 200 08/03/2014    Lab Results  Component Value Date   TESTOSTERONE 63 (L) 04/01/2017    Lab Results  Component Value Date   HGBA1C 7.5 05/18/2017    Urinalysis Lab Results  Component Value Date   SPECGRAV 1.016 05/18/2017   PHUR 6.0 05/18/2017   COLORU Yellow 05/18/2017   APPEARANCEUR Clear 05/18/2017   LEUKOCYTESUR Negative 05/18/2017   PROTEINUR Negative 05/18/2017   GLUCOSEU Negative 05/18/2017   KETONESU Negative 05/18/2017   RBCU Negative 05/18/2017   BILIRUBINUR Negative 05/18/2017   UUROB 0.2 05/18/2017   NITRITE Negative 05/18/2017    Lab Results  Component Value Date   LABMICR Comment 05/18/2017     Assessment & Plan:    1. Prostate cancer ([place]) PSA was drawn today and is stable recommend follow-up in 4 months.  His voiding symptoms have improved.  - PSA  2. Erectile dysfunction No improvement on PDE 5 inhibitors.  He has been on intracavernosal injections in the past and does not desire to restart.  He was interested in penile implant surgery and will set up an appointment for further evaluation.   [name], [place] [address] [place], [place] [address] [phone]

## 2017-05-29 LAB — PSA: Prostate Specific Ag, Serum: <0.1 ng/mL (ref 0.0–4.0)

## 2017-05-31 ENCOUNTER — Encounter: Payer: Self-pay | Admitting: Urology

## 2017-06-01 ENCOUNTER — Encounter: Payer: Self-pay | Admitting: Family Medicine

## 2017-06-23 ENCOUNTER — Other Ambulatory Visit: Payer: Self-pay

## 2017-06-23 MED ORDER — SILDENAFIL CITRATE 20 MG PO TABS: ORAL_TABLET | ORAL | 0 refills | Status: DC

## 2017-08-05 DIAGNOSIS — M5136 Other intervertebral disc degeneration, lumbar region: Secondary | ICD-10-CM | POA: Diagnosis not present

## 2017-08-05 DIAGNOSIS — M5441 Lumbago with sciatica, right side: Secondary | ICD-10-CM | POA: Diagnosis not present

## 2017-08-05 DIAGNOSIS — M7918 Myalgia, other site: Secondary | ICD-10-CM | POA: Diagnosis not present

## 2017-08-05 DIAGNOSIS — M9904 Segmental and somatic dysfunction of sacral region: Secondary | ICD-10-CM | POA: Diagnosis not present

## 2017-08-05 DIAGNOSIS — M9903 Segmental and somatic dysfunction of lumbar region: Secondary | ICD-10-CM | POA: Diagnosis not present

## 2017-08-06 DIAGNOSIS — M7918 Myalgia, other site: Secondary | ICD-10-CM | POA: Diagnosis not present

## 2017-08-06 DIAGNOSIS — M5136 Other intervertebral disc degeneration, lumbar region: Secondary | ICD-10-CM | POA: Diagnosis not present

## 2017-08-06 DIAGNOSIS — M9903 Segmental and somatic dysfunction of lumbar region: Secondary | ICD-10-CM | POA: Diagnosis not present

## 2017-08-06 DIAGNOSIS — M9904 Segmental and somatic dysfunction of sacral region: Secondary | ICD-10-CM | POA: Diagnosis not present

## 2017-08-06 DIAGNOSIS — M5441 Lumbago with sciatica, right side: Secondary | ICD-10-CM | POA: Diagnosis not present

## 2017-08-11 ENCOUNTER — Ambulatory Visit: Payer: Self-pay | Admitting: Nurse Practitioner

## 2017-08-11 DIAGNOSIS — M7918 Myalgia, other site: Secondary | ICD-10-CM | POA: Diagnosis not present

## 2017-08-11 DIAGNOSIS — M9903 Segmental and somatic dysfunction of lumbar region: Secondary | ICD-10-CM | POA: Diagnosis not present

## 2017-08-11 DIAGNOSIS — M5441 Lumbago with sciatica, right side: Secondary | ICD-10-CM | POA: Diagnosis not present

## 2017-08-11 DIAGNOSIS — M9904 Segmental and somatic dysfunction of sacral region: Secondary | ICD-10-CM | POA: Diagnosis not present

## 2017-08-11 DIAGNOSIS — M5136 Other intervertebral disc degeneration, lumbar region: Secondary | ICD-10-CM | POA: Diagnosis not present

## 2017-08-28 DIAGNOSIS — S61211A Laceration without foreign body of left index finger without damage to nail, initial encounter: Secondary | ICD-10-CM | POA: Diagnosis not present

## 2017-08-31 ENCOUNTER — Other Ambulatory Visit: Payer: Self-pay | Admitting: Internal Medicine

## 2017-09-29 ENCOUNTER — Encounter: Payer: Self-pay | Admitting: Urology

## 2017-09-29 ENCOUNTER — Ambulatory Visit: Payer: Medicare Other | Admitting: Urology

## 2017-10-27 ENCOUNTER — Other Ambulatory Visit: Payer: Self-pay

## 2017-10-27 MED ORDER — CARVEDILOL 12.5 MG PO TABS: 12.5000 mg | ORAL_TABLET | Freq: Two times a day (BID) | ORAL | 4 refills | Status: DC

## 2017-11-20 ENCOUNTER — Telehealth: Payer: Self-pay

## 2017-11-20 NOTE — Telephone Encounter (Signed)
COLOGUARD ORDER [id number] EXPIRED BECAUSE IT HAS EXCEEDED 365 DAYS FROM THE INITIAL ORDER.

## 2017-12-01 DIAGNOSIS — R6889 Other general symptoms and signs: Secondary | ICD-10-CM | POA: Diagnosis not present

## 2018-01-13 ENCOUNTER — Other Ambulatory Visit: Payer: Self-pay

## 2018-01-13 MED ORDER — AMLODIPINE BESYLATE 10 MG PO TABS: ORAL_TABLET | ORAL | 0 refills | Status: DC

## 2018-01-17 ENCOUNTER — Other Ambulatory Visit: Payer: Self-pay | Admitting: Internal Medicine

## 2018-01-19 ENCOUNTER — Other Ambulatory Visit: Payer: Self-pay | Admitting: Internal Medicine

## 2018-01-22 ENCOUNTER — Other Ambulatory Visit: Payer: Self-pay

## 2018-01-22 NOTE — Patient Outreach (Signed)
[facility]) Care Management  01/22/2018  Gary EBRON Sr. 12/21/1945 [id number]   Medication Adherence call to [name] patient did not answer patient is due on Losartan/Hctz 100/25 mg  [name] is showing past due under Bishop.   [facility] Management Direct Dial [phone]  Fax [phone] [name]

## 2018-03-09 ENCOUNTER — Other Ambulatory Visit: Payer: Self-pay | Admitting: Nurse Practitioner

## 2018-03-10 ENCOUNTER — Other Ambulatory Visit: Payer: Self-pay | Admitting: Nurse Practitioner

## 2018-03-11 ENCOUNTER — Other Ambulatory Visit: Payer: Self-pay

## 2018-03-11 MED ORDER — COLCHICINE 0.6 MG PO TABS: ORAL_TABLET | ORAL | 0 refills | Status: DC

## 2018-04-26 ENCOUNTER — Other Ambulatory Visit: Payer: Self-pay

## 2018-04-26 MED ORDER — LOSARTAN POTASSIUM-HCTZ 100-12.5 MG PO TABS: ORAL_TABLET | ORAL | 0 refills | Status: DC

## 2018-04-30 ENCOUNTER — Other Ambulatory Visit: Payer: Self-pay

## 2018-04-30 ENCOUNTER — Emergency Department: Payer: Medicare Other

## 2018-04-30 ENCOUNTER — Inpatient Hospital Stay
Admission: EM | Admit: 2018-04-30 | Discharge: 2018-05-04 | DRG: 229 | Disposition: A | Discharge status: Home / Self Care | Payer: Medicare Other | Attending: Internal Medicine | Admitting: Internal Medicine

## 2018-04-30 DIAGNOSIS — E8779 Other fluid overload: Secondary | ICD-10-CM | POA: Diagnosis not present

## 2018-04-30 DIAGNOSIS — I959 Hypotension, unspecified: Secondary | ICD-10-CM | POA: Diagnosis present

## 2018-04-30 DIAGNOSIS — R079 Chest pain, unspecified: Secondary | ICD-10-CM | POA: Diagnosis not present

## 2018-04-30 DIAGNOSIS — Z23 Encounter for immunization: Secondary | ICD-10-CM

## 2018-04-30 DIAGNOSIS — Z6830 Body mass index (BMI) 30.0-30.9, adult: Secondary | ICD-10-CM

## 2018-04-30 DIAGNOSIS — Z79899 Other long term (current) drug therapy: Secondary | ICD-10-CM | POA: Diagnosis not present

## 2018-04-30 DIAGNOSIS — K219 Gastro-esophageal reflux disease without esophagitis: Secondary | ICD-10-CM | POA: Diagnosis present

## 2018-04-30 DIAGNOSIS — I209 Angina pectoris, unspecified: Secondary | ICD-10-CM | POA: Diagnosis not present

## 2018-04-30 DIAGNOSIS — Z950 Presence of cardiac pacemaker: Secondary | ICD-10-CM

## 2018-04-30 DIAGNOSIS — Z7902 Long term (current) use of antithrombotics/antiplatelets: Secondary | ICD-10-CM

## 2018-04-30 DIAGNOSIS — I10 Essential (primary) hypertension: Secondary | ICD-10-CM | POA: Diagnosis present

## 2018-04-30 DIAGNOSIS — R918 Other nonspecific abnormal finding of lung field: Secondary | ICD-10-CM | POA: Diagnosis present

## 2018-04-30 DIAGNOSIS — E785 Hyperlipidemia, unspecified: Secondary | ICD-10-CM | POA: Diagnosis present

## 2018-04-30 DIAGNOSIS — E669 Obesity, unspecified: Secondary | ICD-10-CM | POA: Diagnosis present

## 2018-04-30 DIAGNOSIS — E119 Type 2 diabetes mellitus without complications: Secondary | ICD-10-CM | POA: Diagnosis present

## 2018-04-30 DIAGNOSIS — I4891 Unspecified atrial fibrillation: Secondary | ICD-10-CM | POA: Diagnosis not present

## 2018-04-30 DIAGNOSIS — I442 Atrioventricular block, complete: Principal | ICD-10-CM | POA: Diagnosis present

## 2018-04-30 DIAGNOSIS — R001 Bradycardia, unspecified: Secondary | ICD-10-CM | POA: Diagnosis not present

## 2018-04-30 DIAGNOSIS — E860 Dehydration: Secondary | ICD-10-CM | POA: Diagnosis present

## 2018-04-30 DIAGNOSIS — R110 Nausea: Secondary | ICD-10-CM | POA: Diagnosis not present

## 2018-04-30 DIAGNOSIS — J9811 Atelectasis: Secondary | ICD-10-CM | POA: Diagnosis not present

## 2018-04-30 DIAGNOSIS — Z794 Long term (current) use of insulin: Secondary | ICD-10-CM

## 2018-04-30 DIAGNOSIS — R9389 Abnormal findings on diagnostic imaging of other specified body structures: Secondary | ICD-10-CM

## 2018-04-30 DIAGNOSIS — Z7982 Long term (current) use of aspirin: Secondary | ICD-10-CM | POA: Diagnosis not present

## 2018-04-30 DIAGNOSIS — R42 Dizziness and giddiness: Secondary | ICD-10-CM | POA: Diagnosis not present

## 2018-04-30 DIAGNOSIS — Z8546 Personal history of malignant neoplasm of prostate: Secondary | ICD-10-CM | POA: Diagnosis not present

## 2018-04-30 DIAGNOSIS — I200 Unstable angina: Secondary | ICD-10-CM | POA: Diagnosis not present

## 2018-04-30 DIAGNOSIS — R0602 Shortness of breath: Secondary | ICD-10-CM | POA: Diagnosis not present

## 2018-04-30 DIAGNOSIS — R778 Other specified abnormalities of plasma proteins: Secondary | ICD-10-CM | POA: Diagnosis not present

## 2018-04-30 DIAGNOSIS — R0689 Other abnormalities of breathing: Secondary | ICD-10-CM | POA: Diagnosis not present

## 2018-04-30 LAB — COMPREHENSIVE METABOLIC PANEL WITH GFR
ALT: 20 U/L (ref 0–44)
AST: 19 U/L (ref 15–41)
Albumin: 3.9 g/dL (ref 3.5–5.0)
Alkaline Phosphatase: 80 U/L (ref 38–126)
Anion gap: 8 (ref 5–15)
BUN: 26 mg/dL — ABNORMAL HIGH (ref 8–23)
CO2: 27 mmol/L (ref 22–32)
Calcium: 9.2 mg/dL (ref 8.9–10.3)
Chloride: 104 mmol/L (ref 98–111)
Creatinine, Ser: 1.03 mg/dL (ref 0.61–1.24)
GFR calc Af Amer: >60 mL/min
GFR calc non Af Amer: >60 mL/min
Glucose, Bld: 148 mg/dL — ABNORMAL HIGH (ref 70–99)
Potassium: 3.5 mmol/L (ref 3.5–5.1)
Sodium: 139 mmol/L (ref 135–145)
Total Bilirubin: 0.7 mg/dL (ref 0.3–1.2)
Total Protein: 6.9 g/dL (ref 6.5–8.1)

## 2018-04-30 LAB — CBC
HCT: 42.5 % (ref 39.0–52.0)
Hemoglobin: 13.8 g/dL (ref 13.0–17.0)
MCH: 26.9 pg (ref 26.0–34.0)
MCHC: 32.5 g/dL (ref 30.0–36.0)
MCV: 82.8 fL (ref 80.0–100.0)
Platelets: 206 10*3/uL (ref 150–400)
RBC: 5.13 MIL/uL (ref 4.22–5.81)
RDW: 13.9 % (ref 11.5–15.5)
WBC: 5 10*3/uL (ref 4.0–10.5)
nRBC: 0 % (ref 0.0–0.2)

## 2018-04-30 LAB — TROPONIN I
Troponin I: 0.04 ng/mL
Troponin I: 0.03 ng/mL (ref <0.03)
Troponin I: 0.03 ng/mL (ref <0.03)

## 2018-04-30 LAB — MAGNESIUM: Magnesium: 1.8 mg/dL (ref 1.7–2.4)

## 2018-04-30 MED ORDER — COLCHICINE 0.6 MG PO TABS
0.6000 mg | ORAL_TABLET | Freq: Every day | ORAL | Status: DC
  Filled 2018-04-30: qty 1

## 2018-04-30 MED ORDER — ASPIRIN 81 MG PO CHEW
324.0000 mg | CHEWABLE_TABLET | Freq: Once | ORAL | Status: AC
  Administered 2018-04-30: 324 mg via ORAL
  Filled 2018-04-30: qty 4

## 2018-04-30 MED ORDER — ENOXAPARIN SODIUM 40 MG/0.4ML ~~LOC~~ SOLN
40.0000 mg | SUBCUTANEOUS | Status: DC
  Administered 2018-04-30 – 2018-05-03 (×4): 40 mg via SUBCUTANEOUS
  Filled 2018-04-30 (×4): qty 0.4

## 2018-04-30 MED ORDER — COLCHICINE 0.6 MG PO TABS
0.6000 mg | ORAL_TABLET | Freq: Every day | ORAL | Status: DC
  Administered 2018-04-30 – 2018-05-03 (×4): 0.6 mg via ORAL
  Filled 2018-04-30 (×4): qty 1

## 2018-04-30 MED ORDER — SODIUM CHLORIDE 0.9% FLUSH
3.0000 mL | Freq: Two times a day (BID) | INTRAVENOUS | Status: DC
  Administered 2018-04-30 – 2018-05-02 (×4): 3 mL via INTRAVENOUS

## 2018-04-30 MED ORDER — SODIUM CHLORIDE 0.9 % IV SOLN: 250.0000 mL | INTRAVENOUS | Status: DC | PRN

## 2018-04-30 MED ORDER — SENNOSIDES-DOCUSATE SODIUM 8.6-50 MG PO TABS: 1.0000 | ORAL_TABLET | Freq: Every evening | ORAL | Status: DC | PRN

## 2018-04-30 MED ORDER — PNEUMOCOCCAL VAC POLYVALENT 25 MCG/0.5ML IJ INJ: 0.5000 mL | INJECTION | INTRAMUSCULAR | Status: DC

## 2018-04-30 MED ORDER — SODIUM CHLORIDE 0.9 % IV SOLN
INTRAVENOUS | Status: DC
  Administered 2018-04-30 – 2018-05-02 (×4): via INTRAVENOUS

## 2018-04-30 MED ORDER — MAGNESIUM SULFATE IN D5W 1-5 GM/100ML-% IV SOLN
1.0000 g | Freq: Once | INTRAVENOUS | Status: AC
  Administered 2018-04-30: 1 g via INTRAVENOUS
  Filled 2018-04-30: qty 100

## 2018-04-30 MED ORDER — ATROPINE SULFATE 0.4 MG/ML IJ SOLN
0.4000 mg | INTRAMUSCULAR | Status: DC | PRN
  Filled 2018-04-30 (×2): qty 1

## 2018-04-30 MED ORDER — ONDANSETRON HCL 4 MG/2ML IJ SOLN: 4.0000 mg | Freq: Four times a day (QID) | INTRAMUSCULAR | Status: DC | PRN

## 2018-04-30 MED ORDER — ALLOPURINOL 300 MG PO TABS
300.0000 mg | ORAL_TABLET | Freq: Every day | ORAL | Status: DC
  Administered 2018-04-30 – 2018-05-03 (×4): 300 mg via ORAL
  Filled 2018-04-30 (×5): qty 1

## 2018-04-30 MED ORDER — SODIUM CHLORIDE 0.9% FLUSH: 3.0000 mL | INTRAVENOUS | Status: DC | PRN

## 2018-04-30 MED ORDER — POTASSIUM CHLORIDE CRYS ER 20 MEQ PO TBCR
40.0000 meq | EXTENDED_RELEASE_TABLET | Freq: Once | ORAL | Status: AC
  Administered 2018-04-30: 40 meq via ORAL
  Filled 2018-04-30: qty 2

## 2018-04-30 MED ORDER — ACETAMINOPHEN 325 MG PO TABS
650.0000 mg | ORAL_TABLET | Freq: Four times a day (QID) | ORAL | Status: DC | PRN
  Administered 2018-05-02: 650 mg via ORAL
  Filled 2018-04-30: qty 2

## 2018-04-30 MED ORDER — ACETAMINOPHEN 650 MG RE SUPP: 650.0000 mg | Freq: Four times a day (QID) | RECTAL | Status: DC | PRN

## 2018-04-30 MED ORDER — ONDANSETRON HCL 4 MG PO TABS: 4.0000 mg | ORAL_TABLET | Freq: Four times a day (QID) | ORAL | Status: DC | PRN

## 2018-04-30 NOTE — ED Notes (Signed)
Pt going in and out of afib and a wide complex bradycardic rhythm

## 2018-04-30 NOTE — Progress Notes (Signed)
Talked to Dr. Estanislado Pandy about patient's HR still sustaining 30's-40's, cardiology has not seen him yet, patient asymptomatic. Asked if patient can have atropine PRN, order given. RN will continue to monitor.

## 2018-04-30 NOTE — ED Provider Notes (Signed)
[facility] Emergency Department Provider Note       Time seen: ----------------------------------------- 12:37 PM on 04/30/2018 -----------------------------------------   I have reviewed the triage vital signs and the nursing notes.  HISTORY   Chief Complaint Dizziness; Chest Pain; and Nausea    HPI Gary JUSTO Sr. is a 72 y.o. male with a history of diabetes, GERD, hyperlipidemia, hypertension who presents to the ED for dizziness.  Patient states he was at work when he began to feel dizzy, was diaphoretic and had to sit down.  EMS was contacted, he was found to be bradycardic and hypotensive.  He was also nauseous during this time.  Currently his symptoms have resolved.  He denies any difficulty in the past with bradycardia.  Reports he has had an arrhythmia in the past.  He denies any recent illness or other complaints.  Past Medical History:  Diagnosis Date  . Diabetes mellitus without complication ([place])   . GERD (gastroesophageal reflux disease)   . Hyperlipidemia   . Hypertension     Patient Active Problem List   Diagnosis Date Noted  . Hypertension 05/18/2017  . GERD (gastroesophageal reflux disease) 05/18/2017  . Prostate cancer ([place]) 06/26/2016  . Anejaculation 10/18/2012  . Erectile dysfunction following radiation therapy 10/18/2012  . Microscopic hematuria 04/19/2012  . Testicular hypofunction 04/19/2012    Past Surgical History:  Procedure Laterality Date  . HERNIA REPAIR    . TONSILLECTOMY Bilateral    1992    Allergies Patient has no known allergies.  Social History Social History   Tobacco Use  . Smoking status: Never Smoker  . Smokeless tobacco: Never Used  Substance Use Topics  . Alcohol use: No    Frequency: Never  . Drug use: No   Review of Systems Constitutional: Negative for fever. Cardiovascular: Negative for chest pain. Respiratory: Negative for shortness of breath. Gastrointestinal: Negative for  abdominal pain, vomiting and diarrhea. Musculoskeletal: Negative for back pain. Skin: Positive for diaphoresis Neurological: Positive for recent weakness and dizziness  All systems negative/normal/unremarkable except as stated in the HPI  ____________________________________________   PHYSICAL EXAM:  VITAL SIGNS: ED Triage Vitals  Enc Vitals Group     BP 04/30/18 1207 126/72     Pulse Rate 04/30/18 1207 (!) 43     Resp 04/30/18 1207 12     Temp --      Temp src --      SpO2 04/30/18 1207 99 %     Weight 04/30/18 1208 265 lb (120.2 kg)     Height 04/30/18 1208 6' 3.5" (1.918 m)     Head Circumference --      Peak Flow --      Pain Score 04/30/18 1208 0     Pain Loc --      Pain Edu? --      Excl. in [place]? --    Constitutional: Alert and oriented. Well appearing and in no distress. Eyes: Conjunctivae are normal. Normal extraocular movements. ENT   Head: Normocephalic and atraumatic.   Nose: No congestion/rhinnorhea.   Mouth/Throat: Mucous membranes are moist.   Neck: No stridor. Cardiovascular: Slow rate, irregular rhythm. No murmurs, rubs, or gallops. Respiratory: Normal respiratory effort without tachypnea nor retractions. Breath sounds are clear and equal bilaterally. No wheezes/rales/rhonchi. Gastrointestinal: Soft and nontender. Normal bowel sounds Musculoskeletal: Nontender with normal range of motion in extremities. No lower extremity tenderness nor edema. Neurologic:  Normal speech and language. No gross focal neurologic deficits are  appreciated.  Skin:  Skin is warm, dry and intact. No rash noted. Psychiatric: Mood and affect are normal. Speech and behavior are normal.  ____________________________________________  EKG: Interpreted by me.  Atrial fibrillation with a rate of 44 bpm, likely old anterior infarct, T wave inversions are noted, leftward axis, normal QT Repeat EKG interpreted by me, slow atrial fibrillation with a rate of 45 bpm, leftward  axis, T wave inversions, normal QT .  Repeat EKG reveals a widened QRS with a right bundle branch block and left anterior fascicular block, junctional rhythm with a rate of 40 bpm ____________________________________________  ED COURSE:  As part of my medical decision making, I reviewed the following data within the [place] History obtained from family if available, nursing notes, old chart and ekg, as well as notes from prior ED visits. Patient presented for near syncope and bradycardia, we will assess with labs and imaging as indicated at this time.   Procedures ____________________________________________   LABS (pertinent positives/negatives)  Labs Reviewed  TROPONIN I - Abnormal; Notable for the following components:      Result Value   Troponin I 0.04 (*)    All other components within normal limits  COMPREHENSIVE METABOLIC PANEL - Abnormal; Notable for the following components:   Glucose, Bld 148 (*)    BUN 26 (*)    All other components within normal limits  CBC  MAGNESIUM   CRITICAL CARE Performed by: Laurence Aly   Total critical care time: 30 minutes  Critical care time was exclusive of separately billable procedures and treating other patients.  Critical care was necessary to treat or prevent imminent or life-threatening deterioration.  Critical care was time spent personally by me on the following activities: development of treatment plan with patient and/or surrogate as well as nursing, discussions with consultants, evaluation of patient's response to treatment, examination of patient, obtaining history from patient or surrogate, ordering and performing treatments and interventions, ordering and review of laboratory studies, ordering and review of radiographic studies, pulse oximetry and re-evaluation of patient's condition.  RADIOLOGY Images were viewed by me  Chest x-ray  IMPRESSION: Very low inspiratory volumes.  Patchy left  basilar airspace opacity may reflect atelectasis and/or pneumonia.  Mild vascular congestion without overt edema. ____________________________________________  DIFFERENTIAL DIAGNOSIS   Arrhythmia, electrolyte abnormality, medication side effect, dehydration, occult infection  FINAL ASSESSMENT AND PLAN  Dizziness, bradycardia, elevated troponin   Plan: The patient had presented for dizziness. Patient's labs was unremarkable with exception of slightly elevated troponin of 0.04. Patient's imaging revealed mild vascular congestion.  Patient has had bradycardia of uncertain etiology.  Is difficult to assess if he is having slow A. fib or junctional rhythm.  His troponin is elevated, we gave him a double dose aspirin.  He is not symptomatic at this time.  I will discuss with cardiology and the hospitalist for admission.   Laurence Aly, MD   Note: This note was generated in part or whole with voice recognition software. Voice recognition is usually quite accurate but there are transcription errors that can and very often do occur. I apologize for any typographical errors that were not detected and corrected.     Earleen Newport, MD 04/30/18 [phone]

## 2018-04-30 NOTE — ED Notes (Signed)
Date and time results received: 04/30/18 12:55 PM   Test: troponin Critical Value: 0.04  Name of Provider Notified: Dr Jimmye Norman

## 2018-04-30 NOTE — ED Notes (Signed)
Pts wife departing. Her number is [phone].

## 2018-04-30 NOTE — Progress Notes (Signed)
Advanced care plan.  Purpose of the Encounter: CODE STATUS  Parties in Attendance: Patient and family  Patient's Decision Capacity: Good  Subjective/Patient's story: Presented to the emergency room for dizziness   Objective/Medical story Has bradycardia Needs evaluation with cardiology and telemetry monitoring   Goals of care determination:  Advance care directives goals of care and treatment plan discussed Patient wants everything done which includes CPR, intubation ventilator if the need arises   CODE STATUS: Full code   Time spent discussing advanced care planning: 16 minutes

## 2018-04-30 NOTE — H&P (Signed)
[facility] [place] NAME: [name]    MR#:  [id number]  DATE OF BIRTH:  Feb 02, 1946  DATE OF ADMISSION:  04/30/2018  PRIMARY CARE PHYSICIAN: Ronnell Freshwater, NP   REQUESTING/REFERRING PHYSICIAN:   CHIEF COMPLAINT:   Chief Complaint  Patient presents with  . Dizziness  . Chest Pain  . Nausea    HISTORY OF PRESENT ILLNESS: [name]  is a 72 y.o. male with a known history of diabetes mellitus type 2, GERD, hyperlipidemia, hypertension presented to the emergency room for dizziness.  Patient felt dizzy and came to the emergency room.  Also had mild chest discomfort.  No history of fall or head injury.  Was evaluated in the emergency room his heart rate was low.  Heart rate was around 40 bpm.  This was discussed with Dr. Clayborn Bigness cardiologist on-call by ER physician who recommended to hold beta-blocker and admit patient to medical telemetry.  PAST MEDICAL HISTORY:   Past Medical History:  Diagnosis Date  . Diabetes mellitus without complication ([place])   . GERD (gastroesophageal reflux disease)   . Hyperlipidemia   . Hypertension     PAST SURGICAL HISTORY:  Past Surgical History:  Procedure Laterality Date  . HERNIA REPAIR    . TONSILLECTOMY Bilateral    1992    SOCIAL HISTORY:  Social History   Tobacco Use  . Smoking status: Never Smoker  . Smokeless tobacco: Never Used  Substance Use Topics  . Alcohol use: No    Frequency: Never    FAMILY HISTORY:  Family History  Problem Relation Age of Onset  . Cirrhosis Father   . Bone cancer Mother     DRUG ALLERGIES: No Known Allergies  REVIEW OF SYSTEMS:   CONSTITUTIONAL: No fever, fatigue or weakness.  EYES: No blurred or double vision.  EARS, NOSE, AND THROAT: No tinnitus or ear pain.  RESPIRATORY: No cough, shortness of breath, wheezing or hemoptysis.  CARDIOVASCULAR: Had chest pain,no orthopnea, edema.  GASTROINTESTINAL: No nausea, vomiting, diarrhea or  abdominal pain.  GENITOURINARY: No dysuria, hematuria.  ENDOCRINE: No polyuria, nocturia,  HEMATOLOGY: No anemia, easy bruising or bleeding SKIN: No rash or lesion. MUSCULOSKELETAL: No joint pain or arthritis.   NEUROLOGIC: No tingling, numbness, weakness.  Had dizziness PSYCHIATRY: No anxiety or depression.   MEDICATIONS AT HOME:  Prior to Admission medications   Medication Sig Start Date End Date Taking? Authorizing Provider  allopurinol (ZYLOPRIM) 300 MG tablet TAKE 1 TABLET BY MOUTH AT BEDTIME FOR GOUT Patient taking differently: Take 300 mg by mouth at bedtime.  01/19/18  Yes Boscia, Heather E, NP  amLODipine (NORVASC) 10 MG tablet Take 1 tab po daily Patient taking differently: Take 10 mg by mouth daily.  01/13/18  Yes Ronnell Freshwater, NP  carvedilol (COREG) 12.5 MG tablet Take 1 tablet (12.5 mg total) by mouth 2 (two) times daily with a meal. 10/27/17  Yes Boscia, Heather E, NP  colchicine 0.6 MG tablet 1 TAB TWICE DAILY X 3 DAYS FOR GOUT FLARE UP. OTHERWISE ONCE DAILY FOR PREVENTION Patient taking differently: Take 0.6 mg by mouth See admin instructions. Take 1 tablet (0.6MG ) by mouth daily and 1 tablet (0.6MG ) by mouth twice daily for three days as needed for acute gout flare 03/11/18  Yes Boscia, Heather E, NP  furosemide (LASIX) 20 MG tablet Take 20 mg by mouth daily.    Yes [provider]  losartan-hydrochlorothiazide (HYZAAR) 100-12.5 MG tablet TAKE  1 TABLET BY MOUTH EVERY MORNING FOR BREAKFAST Patient taking differently: Take 1 tablet by mouth daily.  04/26/18  Yes Ronnell Freshwater, NP  sildenafil (REVATIO) 20 MG tablet 1-3 tablets 1 hour prior to intercourse 06/23/17  Yes Stoioff, Ronda Fairly, MD  tamsulosin (FLOMAX) 0.4 MG CAPS capsule TAKE 1 CAPSULE (0.4 MG TOTAL) BY MOUTH DAILY. Patient taking differently: Take 0.4 mg by mouth daily.  09/01/17  Yes Boscia, [place], NP      PHYSICAL EXAMINATION:   VITAL SIGNS: Blood pressure 122/62, pulse (!) 40, resp. rate 19,  height 6' 3.5" (1.918 m), weight 120.2 kg, SpO2 98 %.  GENERAL:  72 y.o.-year-old patient lying in the bed with no acute distress.  EYES: Pupils equal, round, reactive to light and accommodation. No scleral icterus. Extraocular muscles intact.  HEENT: Head atraumatic, normocephalic. Oropharynx and nasopharynx clear.  NECK:  Supple, no jugular venous distention. No thyroid enlargement, no tenderness.  LUNGS: Normal breath sounds bilaterally, no wheezing, rales,rhonchi or crepitation. No use of accessory muscles of respiration.  CARDIOVASCULAR: S1, S2 bradycardia. No murmurs, rubs, or gallops.  ABDOMEN: Soft, nontender, nondistended. Bowel sounds present. No organomegaly or mass.  EXTREMITIES: No pedal edema, cyanosis, or clubbing.  NEUROLOGIC: Cranial nerves II through XII are intact. Muscle strength 5/5 in all extremities. Sensation intact. Gait not checked.  PSYCHIATRIC: The patient is alert and oriented x 3.  SKIN: No obvious rash, lesion, or ulcer.   LABORATORY PANEL:   CBC Recent Labs  Lab 04/30/18 1212  WBC 5.0  HGB 13.8  HCT 42.5  PLT 206  MCV 82.8  MCH 26.9  MCHC 32.5  RDW 13.9   ------------------------------------------------------------------------------------------------------------------  Chemistries  Recent Labs  Lab 04/30/18 1212  NA 139  K 3.5  CL 104  CO2 27  GLUCOSE 148*  BUN 26*  CREATININE 1.03  CALCIUM 9.2  MG 1.8  AST 19  ALT 20  ALKPHOS 80  BILITOT 0.7   ------------------------------------------------------------------------------------------------------------------ estimated creatinine clearance is 89.9 mL/min (by C-G formula based on SCr of 1.03 mg/dL). ------------------------------------------------------------------------------------------------------------------ No results for input(s): TSH, T4TOTAL, T3FREE, THYROIDAB in the last 72 hours.  Invalid input(s): FREET3   Coagulation profile No results for input(s): INR, PROTIME in the  last 168 hours. ------------------------------------------------------------------------------------------------------------------- No results for input(s): DDIMER in the last 72 hours. -------------------------------------------------------------------------------------------------------------------  Cardiac Enzymes Recent Labs  Lab 04/30/18 1212  TROPONINI 0.04*   ------------------------------------------------------------------------------------------------------------------ Invalid input(s): POCBNP  ---------------------------------------------------------------------------------------------------------------  Urinalysis    Component Value Date/Time   APPEARANCEUR Clear 05/18/2017 1642   GLUCOSEU Negative 05/18/2017 1642   BILIRUBINUR Negative 05/18/2017 1642   PROTEINUR Negative 05/18/2017 1642   NITRITE Negative 05/18/2017 1642   LEUKOCYTESUR Negative 05/18/2017 1642     RADIOLOGY: Dg Chest Port 1 View  Result Date: 04/30/2018 CLINICAL DATA:  72 year old male with chest pain, nausea and vomiting EXAM: PORTABLE CHEST 1 VIEW COMPARISON:  None. FINDINGS: Cardiomegaly. The mediastinal contours are within normal limits. Inspiratory volumes are very low resulting in crowding of the pulmonary vasculature. There is mild pulmonary vascular congestion but no overt edema. Nonspecific left basilar airspace opacity may reflect atelectasis or infiltrate. No pneumothorax. No acute osseous abnormality. IMPRESSION: Very low inspiratory volumes. Patchy left basilar airspace opacity may reflect atelectasis and/or pneumonia. Mild vascular congestion without overt edema. Electronically Signed   By: Jacqulynn Cadet M.D.   On: 04/30/2018 12:56    EKG: Orders placed or performed during the hospital encounter of 04/30/18  . EKG 12-Lead  . EKG  12-Lead  . EKG 12-Lead  . EKG 12-Lead  . ED EKG  . ED EKG  . ED EKG  . ED EKG  . EKG 12-Lead  . EKG 12-Lead  . ED EKG  . ED EKG    IMPRESSION  AND PLAN:  73 year old male patient with history of hyperlipidemia, hypertension, diabetes mellitus type 2, GERD presented to the emergency room for dizziness  -Bradycardia With patient to telemetry Cardiology consultation Dr. Clayborn Bigness from [facility] cardiology was informed via epic haiku Check TSH level Hold beta-blocker  -Type 2 diabetes mellitus Diabetic diet with sliding scale coverage with insulin  -Dehydration IV fluids  -Dizziness Check orthostatic vitals  All the records are reviewed and case discussed with ED provider. Management plans discussed with the patient, family and they are in agreement.  CODE STATUS:Full code    TOTAL TIME TAKING CARE OF THIS PATIENT: 54 minutes.    Saundra Shelling M.D on 04/30/2018 at 2:36 PM  Between 7am to 6pm - Pager - [phone]  After 6pm go to [email or web address] - password [email or web address] [facility]  Office  [phone]  CC: Primary care physician; Ronnell Freshwater, NP

## 2018-04-30 NOTE — Progress Notes (Signed)
Talked to Dr. Estanislado Pandy about patient's 9 beats of Vtach, mg was 1.8 and potassium was 3.5. RN will continue to monitor.

## 2018-04-30 NOTE — ED Triage Notes (Signed)
Pt arrives from work via [place] with c/c of dizziness, nausea, and chest pain. Sx onset today. EMS reports pt hypotensive with initial systolic of 99 and brady with initial pulse of 36. Pt received 0.5mg  atropine, 4mg  zofran, and 75mL NS during transport. EMS reports atropine had no effect. Initial CBG: 184, 18G IV placed in L forearm. O2 sats reported at 98% on 2L. Pt A7Ox4 on arrival, reports nausea and dizziness since 11am this morning. Denies vomiting/diarrhea.

## 2018-04-30 NOTE — Progress Notes (Signed)
Patient admitted to unit. Oriented to room, call bell, and staff. Bed in lowest position. Fall safety plan reviewed. Skin assessment verified with Danae Chen RN. Telemetry box verification with tele clerk- Box#: --[id number]. Will continue to monitor.

## 2018-05-01 ENCOUNTER — Inpatient Hospital Stay: Payer: Medicare Other

## 2018-05-01 LAB — BASIC METABOLIC PANEL
Anion gap: 6 (ref 5–15)
BUN: 23 mg/dL (ref 8–23)
CO2: 25 mmol/L (ref 22–32)
Calcium: 8.6 mg/dL — ABNORMAL LOW (ref 8.9–10.3)
Chloride: 105 mmol/L (ref 98–111)
Creatinine, Ser: 0.88 mg/dL (ref 0.61–1.24)
GFR calc Af Amer: >60 mL/min (ref >60)
GFR calc non Af Amer: >60 mL/min (ref >60)
Glucose, Bld: 141 mg/dL — ABNORMAL HIGH (ref 70–99)
Potassium: 3.6 mmol/L (ref 3.5–5.1)
Sodium: 136 mmol/L (ref 135–145)

## 2018-05-01 LAB — CBC
HCT: 38.6 % — ABNORMAL LOW (ref 39.0–52.0)
Hemoglobin: 12.6 g/dL — ABNORMAL LOW (ref 13.0–17.0)
MCH: 26.9 pg (ref 26.0–34.0)
MCHC: 32.6 g/dL (ref 30.0–36.0)
MCV: 82.3 fL (ref 80.0–100.0)
Platelets: 199 10*3/uL (ref 150–400)
RBC: 4.69 MIL/uL (ref 4.22–5.81)
RDW: 14.2 % (ref 11.5–15.5)
WBC: 4.9 10*3/uL (ref 4.0–10.5)
nRBC: 0 % (ref 0.0–0.2)

## 2018-05-01 LAB — TROPONIN I: Troponin I: 0.03 ng/mL (ref <0.03)

## 2018-05-01 LAB — PROCALCITONIN: Procalcitonin: <0.1 ng/mL

## 2018-05-01 LAB — TSH: TSH: 0.509 u[IU]/mL (ref 0.350–4.500)

## 2018-05-01 MED ORDER — PANTOPRAZOLE SODIUM 40 MG PO TBEC
40.0000 mg | DELAYED_RELEASE_TABLET | Freq: Every day | ORAL | Status: DC
  Administered 2018-05-01 – 2018-05-04 (×4): 40 mg via ORAL
  Filled 2018-05-01 (×4): qty 1

## 2018-05-01 MED ORDER — ALUM & MAG HYDROXIDE-SIMETH 200-200-20 MG/5ML PO SUSP
30.0000 mL | ORAL | Status: DC | PRN
  Administered 2018-05-01 – 2018-05-03 (×3): 30 mL via ORAL
  Filled 2018-05-01 (×4): qty 30

## 2018-05-01 MED ORDER — CALCIUM CARBONATE ANTACID 500 MG PO CHEW: 1.0000 | CHEWABLE_TABLET | Freq: Two times a day (BID) | ORAL | Status: DC | PRN

## 2018-05-01 NOTE — Consult Note (Addendum)
Reason for Consult: Bradycardia Referring Physician: Leretha Pol NP, Dr. Estanislado Pandy hospitalist  Gary Alar Sr. is an 72 y.o. male.  HPI: 72 year old obese black male history of diabetes GERD hypertension hyperlipidemia presents with vertigo.  Patient been treated for hypertension with HCTZ amlodipine and Coreg presented to the emergency room with bradycardia in the 30s and 40s.  Patient denies any blackout spells or syncope.  Denies any previous history of bradycardia denies any evaluation for obstructive sleep apnea reportedly had cardiac evaluation about 5 years ago including cardiac cath which he reports still been unremarkable.  Patient is been maintained on Coreg for blood pressure management which is been discontinued since his admission  Past Medical History:  Diagnosis Date  . Diabetes mellitus without complication ([place])   . GERD (gastroesophageal reflux disease)   . Hyperlipidemia   . Hypertension     Past Surgical History:  Procedure Laterality Date  . HERNIA REPAIR    . TONSILLECTOMY Bilateral    1992    Family History  Problem Relation Age of Onset  . Cirrhosis Father   . Bone cancer Mother     Social History:  reports that he has never smoked. He has never used smokeless tobacco. He reports that he does not drink alcohol or use drugs.  Allergies: No Known Allergies  Medications: I have reviewed the patient's current medications.  Results for orders placed or performed during the hospital encounter of 04/30/18 (from the past 48 hour(s))  CBC     Status: None   Collection Time: 04/30/18 12:12 PM  Result Value Ref Range   WBC 5.0 4.0 - 10.5 K/uL   RBC 5.13 4.22 - 5.81 MIL/uL   Hemoglobin 13.8 13.0 - 17.0 g/dL   HCT 42.5 39.0 - 52.0 %   MCV 82.8 80.0 - 100.0 fL   MCH 26.9 26.0 - 34.0 pg   MCHC 32.5 30.0 - 36.0 g/dL   RDW 13.9 11.5 - 15.5 %   Platelets 206 150 - 400 K/uL   nRBC 0.0 0.0 - 0.2 %    Comment: Performed at [facility], [address] [place], [place] [address]  Troponin I - Once     Status: Abnormal   Collection Time: 04/30/18 12:12 PM  Result Value Ref Range   Troponin I 0.04 (HH) <0.03 ng/mL    Comment: CRITICAL RESULT CALLED TO, READ BACK BY AND VERIFIED WITH TOM NAGY AT 9381 04/30/18.PMF Performed at [facility], [address] [place], [place] [address]   Comprehensive metabolic panel     Status: Abnormal   Collection Time: 04/30/18 12:12 PM  Result Value Ref Range   Sodium 139 135 - 145 mmol/L   Potassium 3.5 3.5 - 5.1 mmol/L   Chloride 104 98 - 111 mmol/L   CO2 27 22 - 32 mmol/L   Glucose, Bld 148 (H) 70 - 99 mg/dL   BUN 26 (H) 8 - 23 mg/dL   Creatinine, Ser 1.03 0.61 - 1.24 mg/dL   Calcium 9.2 8.9 - 10.3 mg/dL   Total Protein 6.9 6.5 - 8.1 g/dL   Albumin 3.9 3.5 - 5.0 g/dL   AST 19 15 - 41 U/L   ALT 20 0 - 44 U/L   Alkaline Phosphatase 80 38 - 126 U/L   Total Bilirubin 0.7 0.3 - 1.2 mg/dL   GFR calc non Af Amer >60 >60 mL/min   GFR calc Af Amer >60 >60 mL/min   Anion gap 8 5 -  15    Comment: Performed at [facility], [address] [place], [place] [address]  Magnesium     Status: None   Collection Time: 04/30/18 12:12 PM  Result Value Ref Range   Magnesium 1.8 1.7 - 2.4 mg/dL    Comment: Performed at [facility], [address] [place], [place] [address]  Troponin I - Now Then Q6H     Status: Abnormal   Collection Time: 04/30/18  3:25 PM  Result Value Ref Range   Troponin I 0.03 (HH) <0.03 ng/mL    Comment: CRITICAL VALUE NOTED. VALUE IS CONSISTENT WITH PREVIOUSLY REPORTED/CALLED VALUE.PMF Performed at [facility], [address] [place], [place] [address]   Troponin I - Now Then Q6H     Status: Abnormal   Collection Time: 04/30/18  8:39 PM  Result Value Ref Range   Troponin I 0.03 (HH) <0.03 ng/mL    Comment: CRITICAL VALUE NOTED. VALUE IS CONSISTENT WITH PREVIOUSLY REPORTED/CALLED VALUE [facility] Performed at [facility], [address] [place], [place] [address]   Troponin I - Now Then Q6H     Status: Abnormal   Collection Time: 05/01/18  2:53 AM  Result Value Ref Range   Troponin I 0.03 (HH) <0.03 ng/mL    Comment: CRITICAL VALUE NOTED. VALUE IS CONSISTENT WITH PREVIOUSLY REPORTED/CALLED VALUE [facility] Performed at [facility], [address] [place], [place] [address]   Basic metabolic panel     Status: Abnormal   Collection Time: 05/01/18  2:53 AM  Result Value Ref Range   Sodium 136 135 - 145 mmol/L   Potassium 3.6 3.5 - 5.1 mmol/L   Chloride 105 98 - 111 mmol/L   CO2 25 22 - 32 mmol/L   Glucose, Bld 141 (H) 70 - 99 mg/dL   BUN 23 8 - 23 mg/dL   Creatinine, Ser 0.88 0.61 - 1.24 mg/dL   Calcium 8.6 (L) 8.9 - 10.3 mg/dL   GFR calc non Af Amer >60 >60 mL/min   GFR calc Af Amer >60 >60 mL/min   Anion gap 6 5 - 15    Comment: Performed at [facility], [address] [place], [place] [address]  CBC     Status: Abnormal   Collection Time: 05/01/18  2:53 AM  Result Value Ref Range   WBC 4.9 4.0 - 10.5 K/uL   RBC 4.69 4.22 - 5.81 MIL/uL   Hemoglobin 12.6 (L) 13.0 - 17.0 g/dL   HCT 38.6 (L) 39.0 - 52.0 %   MCV 82.3 80.0 - 100.0 fL   MCH 26.9 26.0 - 34.0 pg   MCHC 32.6 30.0 - 36.0 g/dL   RDW 14.2 11.5 - 15.5 %   Platelets 199 150 - 400 K/uL   nRBC 0.0 0.0 - 0.2 %    Comment: Performed at [facility], [address] [place], [place] [address]  TSH     Status: None   Collection Time: 05/01/18  2:53 AM  Result Value Ref Range   TSH 0.509 0.350 - 4.500 uIU/mL    Comment: Performed by a 3rd Generation assay with a functional sensitivity of <=0.01 uIU/mL. Performed at [facility], [address] [place],  [address]     Dg Chest [place] 1 View  Result Date: 04/30/2018 CLINICAL DATA:  72 year old male with chest pain, nausea and vomiting EXAM: PORTABLE CHEST 1 VIEW COMPARISON:  None. FINDINGS: Cardiomegaly. The mediastinal contours are within normal limits.  Inspiratory volumes are very low resulting in crowding of the pulmonary vasculature. There is mild pulmonary vascular congestion but no overt edema. Nonspecific left basilar airspace opacity may reflect atelectasis or infiltrate. No pneumothorax. No acute osseous abnormality. IMPRESSION: Very low inspiratory volumes. Patchy left basilar airspace opacity may reflect atelectasis and/or pneumonia. Mild vascular congestion without overt edema. Electronically Signed   By: Jacqulynn Cadet M.D.   On: 04/30/2018 12:56    Review of Systems  Constitutional: Positive for malaise/fatigue.  HENT: Positive for hearing loss.   Eyes: Negative.   Respiratory: Positive for shortness of breath.   Cardiovascular: Positive for orthopnea and leg swelling.  Gastrointestinal: Negative.   Genitourinary: Negative.   Musculoskeletal: Negative.   Skin: Negative.   Neurological: Positive for dizziness and weakness.  Endo/Heme/Allergies: Negative.   Psychiatric/Behavioral: Negative.    Blood pressure (!) 141/72, pulse (!) 36, temperature 98.2 F (36.8 C), temperature source Oral, resp. rate 20, height 6\' 3"  (1.905 m), weight 114.9 kg, SpO2 98 %. Physical Exam  Nursing note and vitals reviewed. Constitutional: He is oriented to person, place, and time. He appears well-developed and well-nourished.  HENT:  Head: Normocephalic and atraumatic.  Eyes: Pupils are equal, round, and reactive to light. Conjunctivae and EOM are normal.  Neck: Normal range of motion. Neck supple.  Cardiovascular: An irregularly irregular rhythm present. Bradycardia present. PMI is displaced.  Musculoskeletal: Normal range of motion.  Neurological: He is alert and oriented to person, place, and time. He has normal reflexes.  Skin: Skin is warm and dry.  Psychiatric: He has a normal mood and affect.    Assessment/Plan: Bradycardia Atrial fibrillation Hypertension Vertigo Vague chest  pain Nausea GERD Diabetes Hyperlipidemia Obesity History of prostate cancer . Plan Agree with admit to telemetry Rule out for myocardial infarction Discontinue Coreg because of bradycardia Consider anticoagulation with baseline what appears to be atrial fibrillation Continue hypertension management with add losartan in place of Coreg Continue amlodipine and HCTZ Complete hydration Discontinue magnesium Increase activity Consider physical therapy No absolute claudication with permanent pacemaker at this point [name] Staffa 05/01/2018, 12:41 PM

## 2018-05-01 NOTE — Progress Notes (Signed)
MD notified of SVT run and pt hr (pt asymptomatic). No new orders. Will continue to monitor.

## 2018-05-01 NOTE — Progress Notes (Signed)
Pts HR is sustained in 30-40s. [place] notified. No new orders at this time. MD instructs RN to only given atropine if patient sustains a low heart rate and becomes symptomatic. Pt is currently asymptomatic, sitting up straight in bed, no noted discomfort, no shortness of breath, VSS. Pt has family visiting and is complaint free at this time.

## 2018-05-01 NOTE — Progress Notes (Addendum)
[place] at [place] NAME: [name]    MR#:  [id number]  DATE OF BIRTH:  1945/05/25  SUBJECTIVE:  Patient without complaint, daughters at the bedside, cardiology input appreciated, heart rate still in the 30s-low 40s range, denies lightheadedness/dizziness/near syncope symptomatology/chest pain/shortness of breath  REVIEW OF SYSTEMS:  CONSTITUTIONAL: No fever, fatigue or weakness.  EYES: No blurred or double vision.  EARS, NOSE, AND THROAT: No tinnitus or ear pain.  RESPIRATORY: No cough, shortness of breath, wheezing or hemoptysis.  CARDIOVASCULAR: No chest pain, orthopnea, edema.  GASTROINTESTINAL: No nausea, vomiting, diarrhea or abdominal pain.  GENITOURINARY: No dysuria, hematuria.  ENDOCRINE: No polyuria, nocturia,  HEMATOLOGY: No anemia, easy bruising or bleeding SKIN: No rash or lesion. MUSCULOSKELETAL: No joint pain or arthritis.   NEUROLOGIC: No tingling, numbness, weakness.  PSYCHIATRY: No anxiety or depression.   ROS  DRUG ALLERGIES:  No Known Allergies  VITALS:  Blood pressure (!) 141/72, pulse (!) 36, temperature 98.2 F (36.8 C), temperature source Oral, resp. rate 20, height 6\' 3"  (1.905 m), weight 114.9 kg, SpO2 98 %.  PHYSICAL EXAMINATION:  GENERAL:  72 y.o.-year-old patient lying in the bed with no acute distress.  EYES: Pupils equal, round, reactive to light and accommodation. No scleral icterus. Extraocular muscles intact.  HEENT: Head atraumatic, normocephalic. Oropharynx and nasopharynx clear.  NECK:  Supple, no jugular venous distention. No thyroid enlargement, no tenderness.  LUNGS: Normal breath sounds bilaterally, no wheezing, rales,rhonchi or crepitation. No use of accessory muscles of respiration.  CARDIOVASCULAR: S1, S2 normal. No murmurs, rubs, or gallops.  ABDOMEN: Soft, nontender, nondistended. Bowel sounds present. No organomegaly or mass.  EXTREMITIES: No pedal edema, cyanosis, or clubbing.   NEUROLOGIC: Cranial nerves II through XII are intact. Muscle strength 5/5 in all extremities. Sensation intact. Gait not checked.  PSYCHIATRIC: The patient is alert and oriented x 3.  SKIN: No obvious rash, lesion, or ulcer.   Physical Exam LABORATORY PANEL:   CBC Recent Labs  Lab 05/01/18 0253  WBC 4.9  HGB 12.6*  HCT 38.6*  PLT 199   ------------------------------------------------------------------------------------------------------------------  Chemistries  Recent Labs  Lab 04/30/18 1212 05/01/18 0253  NA 139 136  K 3.5 3.6  CL 104 105  CO2 27 25  GLUCOSE 148* 141*  BUN 26* 23  CREATININE 1.03 0.88  CALCIUM 9.2 8.6*  MG 1.8  --   AST 19  --   ALT 20  --   ALKPHOS 80  --   BILITOT 0.7  --    ------------------------------------------------------------------------------------------------------------------  Cardiac Enzymes Recent Labs  Lab 04/30/18 2039 05/01/18 0253  TROPONINI 0.03* 0.03*   ------------------------------------------------------------------------------------------------------------------  RADIOLOGY:  Dg Chest Port 1 View  Result Date: 04/30/2018 CLINICAL DATA:  72 year old male with chest pain, nausea and vomiting EXAM: PORTABLE CHEST 1 VIEW COMPARISON:  None. FINDINGS: Cardiomegaly. The mediastinal contours are within normal limits. Inspiratory volumes are very low resulting in crowding of the pulmonary vasculature. There is mild pulmonary vascular congestion but no overt edema. Nonspecific left basilar airspace opacity may reflect atelectasis or infiltrate. No pneumothorax. No acute osseous abnormality. IMPRESSION: Very low inspiratory volumes. Patchy left basilar airspace opacity may reflect atelectasis and/or pneumonia. Mild vascular congestion without overt edema. Electronically Signed   By: Jacqulynn Cadet M.D.   On: 04/30/2018 12:56    ASSESSMENT AND PLAN:  73 year old male patient with history of hyperlipidemia, hypertension,  diabetes mellitus type 2, GERD presented to the emergency room for dizziness  *  Acute symptomatic bradycardia  This likely due to beta-blocker therapy which has been discontinued Patient did rule out for acute coronary syndrome, continue telemetry monitoring, IV fluids for rehydration, cardiology input appreciated, TSH normal  *Chronic diabetes mellitus type 2 Trolled on current regiment  *Acute dehydration Solved with IV fluids   *Acute Dizziness Secondary to bradycardia Resolved On precautions while in house  *Acute abnormal chest x-ray ?  Questionable infiltrate noted, patient without infectious symptomatology Check procalcitonin level, check two-view chest x-ray for further evaluation, hold off on antibiotics at this time  Disposition Home in 1 to 2 days with cardiology clearance barring any complications   All the records are reviewed and case discussed with Care Management/Social Workerr. Management plans discussed with the patient, family and they are in agreement.  CODE STATUS: full  TOTAL TIME TAKING CARE OF THIS PATIENT: 30 minutes.     POSSIBLE D/C IN 1-2 DAYS, DEPENDING ON CLINICAL CONDITION.   Avel Peace Charrisse Masley M.D on 05/01/2018   Between 7am to 6pm - Pager - [phone]  After 6pm go to [email or web address] - password [email or web address] [facility]  Office  [phone]  CC: Primary care physician; Ronnell Freshwater, NP  Note: This dictation was prepared with Dragon dictation along with smaller phrase technology. Any transcriptional errors that result from this process are unintentional.

## 2018-05-02 ENCOUNTER — Encounter: Payer: Self-pay | Admitting: Internal Medicine

## 2018-05-02 ENCOUNTER — Encounter
Admission: EM | Disposition: A | Discharge status: Home / Self Care | Payer: Self-pay | Source: Home / Self Care | Attending: Internal Medicine

## 2018-05-02 DIAGNOSIS — I10 Essential (primary) hypertension: Secondary | ICD-10-CM

## 2018-05-02 DIAGNOSIS — R001 Bradycardia, unspecified: Secondary | ICD-10-CM

## 2018-05-02 DIAGNOSIS — E8779 Other fluid overload: Secondary | ICD-10-CM

## 2018-05-02 HISTORY — PX: TEMPORARY PACEMAKER: CATH118268

## 2018-05-02 HISTORY — PX: LEFT HEART CATH AND CORONARY ANGIOGRAPHY: CATH118249

## 2018-05-02 HISTORY — PX: CORONARY/GRAFT ACUTE MI REVASCULARIZATION: CATH118305

## 2018-05-02 LAB — RENAL FUNCTION PANEL
ALBUMIN: 4 g/dL (ref 3.5–5.0)
Anion gap: 10 (ref 5–15)
BUN: 22 mg/dL (ref 8–23)
CO2: 25 mmol/L (ref 22–32)
Calcium: 9.8 mg/dL (ref 8.9–10.3)
Chloride: 103 mmol/L (ref 98–111)
Creatinine, Ser: 0.87 mg/dL (ref 0.61–1.24)
GFR calc Af Amer: >60 mL/min (ref >60)
GFR calc non Af Amer: >60 mL/min (ref >60)
Glucose, Bld: 137 mg/dL — ABNORMAL HIGH (ref 70–99)
Phosphorus: 3 mg/dL (ref 2.5–4.6)
Potassium: 3.8 mmol/L (ref 3.5–5.1)
Sodium: 138 mmol/L (ref 135–145)

## 2018-05-02 LAB — GLUCOSE, CAPILLARY
Glucose-Capillary: 135 mg/dL — ABNORMAL HIGH (ref 70–99)
Glucose-Capillary: 167 mg/dL — ABNORMAL HIGH (ref 70–99)

## 2018-05-02 LAB — MRSA PCR SCREENING: MRSA by PCR: NEGATIVE

## 2018-05-02 LAB — MAGNESIUM: Magnesium: 2 mg/dL (ref 1.7–2.4)

## 2018-05-02 LAB — TROPONIN I: Troponin I: 0.07 ng/mL (ref <0.03)

## 2018-05-02 SURGERY — TEMPORARY PACEMAKER

## 2018-05-02 MED ORDER — FUROSEMIDE 10 MG/ML IJ SOLN
INTRAMUSCULAR | Status: DC | PRN
  Administered 2018-05-02: 40 mg via INTRAVENOUS

## 2018-05-02 MED ORDER — PNEUMOCOCCAL VAC POLYVALENT 25 MCG/0.5ML IJ INJ
0.5000 mL | INJECTION | INTRAMUSCULAR | Status: AC
  Administered 2018-05-03: 0.5 mL via INTRAMUSCULAR
  Filled 2018-05-02: qty 0.5

## 2018-05-02 MED ORDER — LOSARTAN POTASSIUM 50 MG PO TABS
100.0000 mg | ORAL_TABLET | Freq: Every day | ORAL | Status: DC
  Administered 2018-05-02 – 2018-05-04 (×3): 100 mg via ORAL
  Filled 2018-05-02 (×3): qty 2

## 2018-05-02 MED ORDER — IOPAMIDOL (ISOVUE-300) INJECTION 61%
INTRAVENOUS | Status: DC | PRN
  Administered 2018-05-02: 65 mL via INTRA_ARTERIAL

## 2018-05-02 MED ORDER — ACETAMINOPHEN 325 MG PO TABS: 650.0000 mg | ORAL_TABLET | Freq: Four times a day (QID) | ORAL | Status: DC | PRN

## 2018-05-02 MED ORDER — LABETALOL HCL 5 MG/ML IV SOLN
10.0000 mg | Freq: Four times a day (QID) | INTRAVENOUS | Status: DC | PRN
  Administered 2018-05-02 – 2018-05-03 (×2): 10 mg via INTRAVENOUS
  Filled 2018-05-02 (×2): qty 4

## 2018-05-02 MED ORDER — ACETAMINOPHEN 325 MG PO TABS
650.0000 mg | ORAL_TABLET | ORAL | Status: DC | PRN
  Administered 2018-05-02: 650 mg via ORAL
  Filled 2018-05-02 (×4): qty 2

## 2018-05-02 MED ORDER — MIDAZOLAM HCL 2 MG/2ML IJ SOLN
INTRAMUSCULAR | Status: AC
  Filled 2018-05-02: qty 2

## 2018-05-02 MED ORDER — FENTANYL CITRATE (PF) 100 MCG/2ML IJ SOLN
INTRAMUSCULAR | Status: AC
  Filled 2018-05-02: qty 2

## 2018-05-02 MED ORDER — SODIUM CHLORIDE 0.9 % IV SOLN: 250.0000 mL | INTRAVENOUS | Status: DC | PRN

## 2018-05-02 MED ORDER — SODIUM CHLORIDE 0.9 % IV SOLN
INTRAVENOUS | Status: AC | PRN
  Administered 2018-05-02: 10 mL/h via INTRAVENOUS

## 2018-05-02 MED ORDER — FUROSEMIDE 10 MG/ML IJ SOLN
INTRAMUSCULAR | Status: AC
  Filled 2018-05-02: qty 4

## 2018-05-02 MED ORDER — ACETAMINOPHEN 650 MG RE SUPP: 650.0000 mg | Freq: Four times a day (QID) | RECTAL | Status: DC | PRN

## 2018-05-02 MED ORDER — SODIUM CHLORIDE 0.9 % WEIGHT BASED INFUSION
1.0000 mL/kg/h | INTRAVENOUS | Status: AC
  Administered 2018-05-02: 0.171 mL/kg/h via INTRAVENOUS

## 2018-05-02 MED ORDER — ONDANSETRON HCL 4 MG/2ML IJ SOLN: 4.0000 mg | Freq: Four times a day (QID) | INTRAMUSCULAR | Status: DC | PRN

## 2018-05-02 MED ORDER — AMLODIPINE BESYLATE 10 MG PO TABS
10.0000 mg | ORAL_TABLET | Freq: Every day | ORAL | Status: DC
  Administered 2018-05-02 – 2018-05-04 (×3): 10 mg via ORAL
  Filled 2018-05-02 (×3): qty 1

## 2018-05-02 MED ORDER — ATROPINE SULFATE 0.4 MG/ML IJ SOLN
0.4000 mg | INTRAMUSCULAR | Status: DC | PRN
  Administered 2018-05-02: 0.4 mg via INTRAVENOUS
  Filled 2018-05-02 (×2): qty 1

## 2018-05-02 MED ORDER — HYDROCHLOROTHIAZIDE 12.5 MG PO CAPS
12.5000 mg | ORAL_CAPSULE | Freq: Every day | ORAL | Status: DC
  Administered 2018-05-03 – 2018-05-04 (×2): 12.5 mg via ORAL
  Filled 2018-05-02 (×2): qty 1

## 2018-05-02 MED ORDER — SODIUM CHLORIDE 0.9% FLUSH: 3.0000 mL | INTRAVENOUS | Status: DC | PRN

## 2018-05-02 MED ORDER — SODIUM CHLORIDE 0.9% FLUSH
3.0000 mL | Freq: Two times a day (BID) | INTRAVENOUS | Status: DC
  Administered 2018-05-02 – 2018-05-04 (×4): 3 mL via INTRAVENOUS

## 2018-05-02 MED ORDER — HEPARIN (PORCINE) IN NACL 2000-0.9 UNIT/L-% IV SOLN
INTRAVENOUS | Status: DC | PRN
  Administered 2018-05-02: 1000 mL

## 2018-05-02 MED ORDER — ATROPINE SULFATE 0.4 MG/ML IJ SOLN
0.4000 mg | Freq: Once | INTRAMUSCULAR | Status: AC
  Administered 2018-05-02: 0.4 mg via INTRAVENOUS
  Filled 2018-05-02: qty 1

## 2018-05-02 MED ORDER — HYDRALAZINE HCL 20 MG/ML IJ SOLN
INTRAMUSCULAR | Status: AC
  Filled 2018-05-02: qty 1

## 2018-05-02 MED ORDER — METOPROLOL TARTRATE 25 MG PO TABS
25.0000 mg | ORAL_TABLET | Freq: Two times a day (BID) | ORAL | Status: DC
  Administered 2018-05-02 – 2018-05-04 (×3): 25 mg via ORAL
  Filled 2018-05-02 (×4): qty 1

## 2018-05-02 MED ORDER — HYDRALAZINE HCL 20 MG/ML IJ SOLN
INTRAMUSCULAR | Status: DC | PRN
  Administered 2018-05-02: 20 mg via INTRAVENOUS

## 2018-05-02 SURGICAL SUPPLY — 14 items
CABLE ADAPT CONN TEMP 6FT (ADAPTER) ×2 IMPLANT
CATH INFINITI 5FR ANG PIGTAIL (CATHETERS) ×2 IMPLANT
CATH INFINITI 5FR JL4 (CATHETERS) ×2 IMPLANT
CATH INFINITI JR4 5F (CATHETERS) ×2 IMPLANT
DEVICE CLOSURE MYNXGRIP 5F (Vascular Products) ×2 IMPLANT
KIT MANI 3VAL PERCEP (MISCELLANEOUS) ×2 IMPLANT
NEEDLE PERC 18GX7CM (NEEDLE) ×2 IMPLANT
PACK CARDIAC CATH (CUSTOM PROCEDURE TRAY) ×2 IMPLANT
SHEATH AVANTI 5FR X 11CM (SHEATH) ×2 IMPLANT
SHEATH AVANTI 6FR X 11CM (SHEATH) ×2 IMPLANT
SHEATH BRITE TIP 6FR X 23 (SHEATH) ×2 IMPLANT
SLEEVE REPOSITIONING LENGTH 30 (MISCELLANEOUS) ×2 IMPLANT
WIRE GUIDERIGHT .035X150 (WIRE) ×2 IMPLANT
WIRE PACING TEMP ST TIP 5 (CATHETERS) ×2 IMPLANT

## 2018-05-02 NOTE — Progress Notes (Signed)
Spoke with Dr Patsey Berthold re NS fluid order post cath.  We will hold off on fluids for now as patient received lasix in cath lab and is responding with copious clear urine output.  Creatinine this AM was .88.  We will check a renal function panel this afternoon.  Currently NS infusing at 20 cc/hr through PIV, and NS infusing at 10 cc/hr through pacer sheath.

## 2018-05-02 NOTE — Progress Notes (Signed)
Patient c/o chest pain 8/10. HR was 28 Blood pressure was 187/92. Patient was diaphoretic and pale. Atropine was administered with relief of chest pain and HR increased to 70 and B/P decreased to 165/77. HR immediatly decreased back down to 27 and sustained. Cardiologist called. New ordered to transfer patient to ICU.

## 2018-05-02 NOTE — Progress Notes (Signed)
[place] at [place] NAME: [name]    MR#:  [id number]  DATE OF BIRTH:  Apr 10, 1946  SUBJECTIVE:  Patient has no complaint, he is a status post temporary pacemaker placement this morning.  Heart rate is about the 60s.  REVIEW OF SYSTEMS:  CONSTITUTIONAL: No fever, fatigue or weakness.  EYES: No blurred or double vision.  EARS, NOSE, AND THROAT: No tinnitus or ear pain.  RESPIRATORY: No cough, shortness of breath, wheezing or hemoptysis.  CARDIOVASCULAR: No chest pain, orthopnea, edema.  GASTROINTESTINAL: No nausea, vomiting, diarrhea or abdominal pain.  GENITOURINARY: No dysuria, hematuria.  ENDOCRINE: No polyuria, nocturia,  HEMATOLOGY: No anemia, easy bruising or bleeding SKIN: No rash or lesion. MUSCULOSKELETAL: No joint pain or arthritis.   NEUROLOGIC: No tingling, numbness, weakness.  PSYCHIATRY: No anxiety or depression.   ROS  DRUG ALLERGIES:  No Known Allergies  VITALS:  Blood pressure (!) 163/68, pulse (!) 59, temperature 99.3 F (37.4 C), temperature source Axillary, resp. rate 17, height 6\' 3"  (1.905 m), weight 117.3 kg, SpO2 96 %.  PHYSICAL EXAMINATION:  GENERAL:  72 y.o.-year-old patient lying in the bed with no acute distress.  EYES: Pupils equal, round, reactive to light and accommodation. No scleral icterus. Extraocular muscles intact.  HEENT: Head atraumatic, normocephalic. Oropharynx and nasopharynx clear.  NECK:  Supple, no jugular venous distention. No thyroid enlargement, no tenderness.  LUNGS: Normal breath sounds bilaterally, no wheezing, rales,rhonchi or crepitation. No use of accessory muscles of respiration.  CARDIOVASCULAR: S1, S2 normal. No murmurs, rubs, or gallops.  ABDOMEN: Soft, nontender, nondistended. Bowel sounds present. No organomegaly or mass.  EXTREMITIES: No pedal edema, cyanosis, or clubbing.  NEUROLOGIC: Cranial nerves II through XII are intact. Muscle strength 5/5 in all extremities.  Sensation intact. Gait not checked.  PSYCHIATRIC: The patient is alert and oriented x 3.  SKIN: No obvious rash, lesion, or ulcer.   Physical Exam LABORATORY PANEL:   CBC Recent Labs  Lab 05/01/18 0253  WBC 4.9  HGB 12.6*  HCT 38.6*  PLT 199   ------------------------------------------------------------------------------------------------------------------  Chemistries  Recent Labs  Lab 04/30/18 1212  05/02/18 1304  NA 139   < > 138  K 3.5   < > 3.8  CL 104   < > 103  CO2 27   < > 25  GLUCOSE 148*   < > 137*  BUN 26*   < > 22  CREATININE 1.03   < > 0.87  CALCIUM 9.2   < > 9.8  MG 1.8  --   --   AST 19  --   --   ALT 20  --   --   ALKPHOS 80  --   --   BILITOT 0.7  --   --    < > = values in this interval not displayed.   ------------------------------------------------------------------------------------------------------------------  Cardiac Enzymes Recent Labs  Lab 05/01/18 0253 05/02/18 0035  TROPONINI 0.03* 0.07*   ------------------------------------------------------------------------------------------------------------------  RADIOLOGY:  Dg Chest 2 View  Result Date: 05/01/2018 CLINICAL DATA:  Patient admitted with bradycardia. Follow-up LEFT basilar atelectasis versus pneumonia. EXAM: CHEST - 2 VIEW COMPARISON:  04/30/2018. FINDINGS: Suboptimal inspiration. Heart moderately enlarged. Mild atelectasis in the lung bases. Lungs otherwise clear. Normal pulmonary vascularity. No visible pleural effusions. IMPRESSION: 1. Suboptimal inspiration accounts for bibasilar atelectasis. No acute cardiopulmonary disease otherwise. 2. Cardiomegaly without pulmonary edema. Electronically Signed   By: Evangeline Dakin M.D.   On: 05/01/2018 16:48  ASSESSMENT AND PLAN:  72 year old male patient with history of hyperlipidemia, hypertension, diabetes mellitus type 2, GERD presented to the emergency room for dizziness  *Acute symptomatic bradycardia  Beta-blocker was  discontinued Patient did rule out for acute coronary syndrome, continue telemetry monitoring, TSH normal. S/p temporary pacemaker placement today.   PPM tomorrow per Dr. Clayborn Bigness.  *Chronic diabetes mellitus type 2 Controlled on current regiment  *Acute dehydration Solved with IV fluids   *Acute Dizziness Secondary to bradycardia Resolved On precautions while in house  *Acute abnormal chest x-ray ?  Questionable infiltrate noted, patient without infectious symptomatology Normal procalcitonin level, check two-view chest x-ray: Suboptimal inspiration accounts for bibasilar atelectasis. No acute cardiopulmonary disease otherwise.  I discussed with Dr. Clayborn Bigness. All the records are reviewed and case discussed with Care Management/Social Workerr. Management plans discussed with the patient, his son and they are in agreement.  CODE STATUS: full  TOTAL TIME TAKING CARE OF THIS PATIENT: 30 minutes.  POSSIBLE D/C IN 1-2 DAYS, DEPENDING ON CLINICAL CONDITION.   Demetrios Loll M.D on 05/02/2018   Between 7am to 6pm - Pager - [phone]  After 6pm go to [email or web address] - password [email or web address] [facility]  Office  [phone]  CC: Primary care physician; Ronnell Freshwater, NP  Note: This dictation was prepared with Dragon dictation along with smaller phrase technology. Any transcriptional errors that result from this process are unintentional.

## 2018-05-02 NOTE — Progress Notes (Signed)
Patient transferred to CCU 17. Report given to [facility]. Patient's daughter at bedside and updated.

## 2018-05-02 NOTE — Consult Note (Signed)
PULMONARY / CRITICAL CARE MEDICINE  Name: Gary JUBA Sr. MRN: [id number] DOB: 04-Nov-1945    LOS: 2  Referring Provider: Dr. Clayborn Bigness Reason for Referral: Bradycardia  HPI: 72 year old pleasant African-American male who is been transferred from to a to the ICU for close monitoring of bradycardia.  His heart rate is fluctuating between 30 and 50 bpm.  He has been given doses of atropine without any significant sustained improvement in his heart rate.  His blood pressure remained stable and patient is completely asymptomatic.  He is to to be seen by cardiology for a temporary pacemaker this morning.  Past Medical History:  Diagnosis Date  . Diabetes mellitus without complication ([place])   . GERD (gastroesophageal reflux disease)   . Hyperlipidemia   . Hypertension    Past Surgical History:  Procedure Laterality Date  . HERNIA REPAIR    . TONSILLECTOMY Bilateral    1992   Prior to Admission medications   Medication Sig Start Date End Date Taking? Authorizing Provider  amLODipine (NORVASC) 5 MG tablet Take 5 mg by mouth daily.   Yes [provider]  clopidogrel (PLAVIX) 75 MG tablet Take 75 mg by mouth daily.   Yes [provider]  donepezil (ARICEPT) 5 MG tablet Take 1 tablet (5 mg total) by mouth at bedtime. 12/28/17 02/06/18 Yes Sowles, Drue Stager, MD  empagliflozin (JARDIANCE) 25 MG TABS tablet Take 25 mg by mouth daily.   Yes [provider]  glycopyrrolate (ROBINUL) 1 MG tablet Take 1 mg by mouth 2 (two) times daily.   Yes [provider]  insulin aspart (NOVOLOG FLEXPEN) 100 UNIT/ML FlexPen Inject 12 Units into the skin 2 (two) times daily.   Yes [provider]  insulin aspart (NOVOLOG) 100 UNIT/ML FlexPen Inject 18 Units into the skin daily. At 1700   Yes [provider]  Insulin Degludec-Liraglutide (XULTOPHY) 100-3.6 UNIT-MG/ML SOPN Inject 50 Units into the skin daily.   Yes [provider]  levETIRAcetam (KEPPRA) 500  MG tablet Take 500 mg by mouth 2 (two) times daily.   Yes [provider]  lipase/protease/amylase (CREON) 12000 units CPEP capsule Take 6,000 Units by mouth 3 (three) times daily before meals.   Yes [provider]  lipase/protease/amylase (CREON) 12000 units CPEP capsule Take 3,000 Units by mouth at bedtime. With snack   Yes [provider]  lisinopril (PRINIVIL,ZESTRIL) 5 MG tablet Take 5 mg by mouth daily.   Yes [provider]  metoprolol succinate (TOPROL-XL) 25 MG 24 hr tablet Take 1 tablet (25 mg total) by mouth daily. 12/28/17  Yes Sowles, Drue Stager, MD  rosuvastatin (CRESTOR) 40 MG tablet Take 1 tablet (40 mg total) by mouth daily. 12/28/17 02/06/18 Yes Steele Sizer, MD  aspirin EC 81 MG tablet Take 81 mg by mouth daily.    [provider]  famotidine (PEPCID) 20 MG tablet Take 1 tablet (20 mg total) by mouth 2 (two) times daily. 12/28/17 01/27/18  Steele Sizer, MD  gabapentin (NEURONTIN) 300 MG capsule Take 1 capsule (300 mg total) by mouth 2 (two) times daily. 12/28/17 01/27/18  Steele Sizer, MD  insulin glargine (LANTUS) 100 UNIT/ML injection Inject 0.1 mLs (10 Units total) into the skin daily. 12/28/17 01/27/18  Steele Sizer, MD  lacosamide 100 MG TABS Take 1 tablet (100 mg total) by mouth 2 (two) times daily. Patient not taking: Reported on 02/06/2018 05/15/17   Fritzi Mandes, MD  promethazine (PHENERGAN) 12.5 MG tablet Take 1 tablet (12.5 mg total) by  mouth every 6 (six) hours as needed for nausea or vomiting. Patient not taking: Reported on 02/06/2018 03/03/17   Stark Klein, MD  sertraline (ZOLOFT) 25 MG tablet Take 1 tablet (25 mg total) by mouth daily. Patient not taking: Reported on 02/06/2018 12/28/17   Steele Sizer, MD   Allergies No Known Allergies  Family History Family History  Problem Relation Age of Onset  . Cirrhosis Father   . Bone cancer Mother    Social History  reports that he has never smoked. He has never used  smokeless tobacco. He reports that he does not drink alcohol or use drugs.  Review Of Systems: All systems reviewed pertinent positives include chest pain that has resolved.  No other complaints of and all other systems are negative  VITAL SIGNS: BP (!) 182/79   Pulse (!) 44   Temp 99.2 F (37.3 C)   Resp 17   Ht 6\' 3"  (1.905 m)   Wt 117.3 kg   SpO2 92%   BMI 32.32 kg/m   HEMODYNAMICS:    VENTILATOR SETTINGS:    INTAKE / OUTPUT: I/O last 3 completed shifts: In: 2840.9 [P.O.:480; I.V.:2260.9; IV Piggyback:100] Out: 1500 [Urine:1500]  PHYSICAL EXAMINATION: General: No acute distress HEENT: PERRLA, trachea midline, no JVD Neuro: Alert and oriented x4, no focal deficits Cardiovascular: Apical pulse bradycardic, S1-S2, no murmur regurg or gallop, +2 pulses bilaterally Lungs: Clear to auscultation bilaterally Abdomen: Nondistended, nontender, normal bowel sounds in all 4 quadrants Musculoskeletal: Positive range of motion in upper and lower extremities Skin: Warm and dry  LABS:  BMET Recent Labs  Lab 04/30/18 1212 05/01/18 0253  NA 139 136  K 3.5 3.6  CL 104 105  CO2 27 25  BUN 26* 23  CREATININE 1.03 0.88  GLUCOSE 148* 141*    Electrolytes Recent Labs  Lab 04/30/18 1212 05/01/18 0253  CALCIUM 9.2 8.6*  MG 1.8  --     CBC Recent Labs  Lab 04/30/18 1212 05/01/18 0253  WBC 5.0 4.9  HGB 13.8 12.6*  HCT 42.5 38.6*  PLT 206 199    Coag's No results for input(s): APTT, INR in the last 168 hours.  Sepsis Markers Recent Labs  Lab 05/01/18 1434  PROCALCITON <0.10    ABG No results for input(s): PHART, PCO2ART, PO2ART in the last 168 hours.  Liver Enzymes Recent Labs  Lab 04/30/18 1212  AST 19  ALT 20  ALKPHOS 80  BILITOT 0.7  ALBUMIN 3.9    Cardiac Enzymes Recent Labs  Lab 04/30/18 2039 05/01/18 0253 05/02/18 0035  TROPONINI 0.03* 0.03* 0.07*    Glucose Recent Labs  Lab 05/02/18 0618  GLUCAP 135*    Imaging Dg Chest 2  View  Result Date: 05/01/2018 CLINICAL DATA:  Patient admitted with bradycardia. Follow-up LEFT basilar atelectasis versus pneumonia. EXAM: CHEST - 2 VIEW COMPARISON:  04/30/2018. FINDINGS: Suboptimal inspiration. Heart moderately enlarged. Mild atelectasis in the lung bases. Lungs otherwise clear. Normal pulmonary vascularity. No visible pleural effusions. IMPRESSION: 1. Suboptimal inspiration accounts for bibasilar atelectasis. No acute cardiopulmonary disease otherwise. 2. Cardiomegaly without pulmonary edema. Electronically Signed   By: Evangeline Dakin M.D.   On: 05/01/2018 16:48    ASSESSMENT Asymptomatic bradycardia Type 2 diabetes mellitus History of hypertension and hyperlipidemia Atrial fibrillation Vertigo Chest pain that has resolved  PLAN: More dynamic monitoring per ICU protocol Cycle cardiac enzymes Monitor on stepdown per cardiology Rest of treatment plan per cardiology Follow-up 2D echo   Best Practice: Code Status: Full  code Diet: N.p.o. for procedure GI prophylaxis: Protonix 40 mg daily VTE prophylaxis: Lovenox subcu  FAMILY  - Updates: Patient and spouse updated at bedside.  All questions answered.  Encouraged to call with questions.  Magdalene S. [facility] ANP-BC Pulmonary and Critical Care Medicine [facility] Pager [phone] or [phone]  NB: This document was prepared using Dragon voice recognition software and may include unintentional dictation errors.    05/02/2018, 9:23 AM

## 2018-05-02 NOTE — Plan of Care (Signed)
Patient resting comfortably post cath/temp pacer insertion.  VPacing at 60 BPM.  UOP copious following admin of lasix in cath lab.  Will check renal panel this afternoon to assess function following dye.  Femoral site w/ minimal soreness, no hematoma or bruising, no blood visible.  Pulses +2 and equal.  Pt repost no numbness or tingling in right LE.  Family at bedside.  HOB <20 degrees.  Will tilt bed for oral intake

## 2018-05-02 NOTE — Progress Notes (Signed)
Patient c/o chest 8/10. Patient denied pain radiating to jaw or arms. Patient initially requested gas or indigestion medication which was not effective in relieving the pain. Spoke to MD, 12 lead ordered, x1 dose of atropine ordered as well. Patient now reports decrease in pain 4/10 at this time and "feeling much better".  Will continue to monitor and endorse.

## 2018-05-03 ENCOUNTER — Inpatient Hospital Stay: Payer: Medicare Other

## 2018-05-03 ENCOUNTER — Encounter
Admission: EM | Disposition: A | Discharge status: Home / Self Care | Payer: Self-pay | Source: Home / Self Care | Attending: Internal Medicine

## 2018-05-03 ENCOUNTER — Inpatient Hospital Stay
Admit: 2018-05-03 | Discharge: 2018-05-03 | Disposition: A | Discharge status: Home / Self Care | Payer: Medicare Other | Attending: Internal Medicine | Admitting: Internal Medicine

## 2018-05-03 ENCOUNTER — Inpatient Hospital Stay: Payer: Medicare Other | Admitting: Anesthesiology

## 2018-05-03 HISTORY — PX: TEMPORARY PACEMAKER: CATH118268

## 2018-05-03 HISTORY — PX: PACEMAKER INSERTION: SHX728

## 2018-05-03 LAB — ECHOCARDIOGRAM COMPLETE
Height: 75 in
Weight: 4137.6 oz

## 2018-05-03 LAB — GLUCOSE, CAPILLARY
Glucose-Capillary: 131 mg/dL — ABNORMAL HIGH (ref 70–99)
Glucose-Capillary: 159 mg/dL — ABNORMAL HIGH (ref 70–99)

## 2018-05-03 SURGERY — INSERTION, CARDIAC PACEMAKER
Anesthesia: General

## 2018-05-03 SURGERY — INSERTION, CARDIAC PACEMAKER
Anesthesia: General | Site: Groin | Laterality: Right

## 2018-05-03 MED ORDER — INSULIN ASPART 100 UNIT/ML ~~LOC~~ SOLN
0.0000 [IU] | Freq: Three times a day (TID) | SUBCUTANEOUS | Status: DC
  Filled 2018-05-03: qty 1

## 2018-05-03 MED ORDER — ACETAMINOPHEN 325 MG PO TABS
325.0000 mg | ORAL_TABLET | ORAL | Status: DC | PRN
  Administered 2018-05-03 – 2018-05-04 (×3): 650 mg via ORAL

## 2018-05-03 MED ORDER — CEFAZOLIN SODIUM-DEXTROSE 2-4 GM/100ML-% IV SOLN
INTRAVENOUS | Status: AC
  Filled 2018-05-03: qty 100

## 2018-05-03 MED ORDER — PROPOFOL 500 MG/50ML IV EMUL
INTRAVENOUS | Status: AC
  Filled 2018-05-03: qty 50

## 2018-05-03 MED ORDER — SODIUM CHLORIDE 0.9 % IV SOLN
INTRAVENOUS | Status: DC | PRN
  Administered 2018-05-03: 200 mL

## 2018-05-03 MED ORDER — SODIUM CHLORIDE 0.9 % IV SOLN: INTRAVENOUS | Status: DC

## 2018-05-03 MED ORDER — PROPOFOL 500 MG/50ML IV EMUL
INTRAVENOUS | Status: DC | PRN
  Administered 2018-05-03: 50 ug/kg/min via INTRAVENOUS

## 2018-05-03 MED ORDER — SODIUM CHLORIDE 0.9 % IV SOLN
80.0000 mg | INTRAVENOUS | Status: DC
  Filled 2018-05-03: qty 2

## 2018-05-03 MED ORDER — CHLORHEXIDINE GLUCONATE 4 % EX LIQD: 60.0000 mL | Freq: Once | CUTANEOUS | Status: DC

## 2018-05-03 MED ORDER — FENTANYL CITRATE (PF) 100 MCG/2ML IJ SOLN
INTRAMUSCULAR | Status: AC
  Filled 2018-05-03: qty 2

## 2018-05-03 MED ORDER — CEFAZOLIN SODIUM-DEXTROSE 2-3 GM-%(50ML) IV SOLR
INTRAVENOUS | Status: DC | PRN
  Administered 2018-05-03: 2 g via INTRAVENOUS

## 2018-05-03 MED ORDER — ONDANSETRON HCL 4 MG/2ML IJ SOLN: 4.0000 mg | Freq: Once | INTRAMUSCULAR | Status: DC | PRN

## 2018-05-03 MED ORDER — FENTANYL CITRATE (PF) 100 MCG/2ML IJ SOLN
INTRAMUSCULAR | Status: DC | PRN
  Administered 2018-05-03: 50 ug via INTRAVENOUS

## 2018-05-03 MED ORDER — CEFAZOLIN SODIUM-DEXTROSE 2-4 GM/100ML-% IV SOLN
2.0000 g | INTRAVENOUS | Status: DC
  Filled 2018-05-03: qty 100

## 2018-05-03 MED ORDER — FENTANYL CITRATE (PF) 100 MCG/2ML IJ SOLN: 25.0000 ug | INTRAMUSCULAR | Status: DC | PRN

## 2018-05-03 MED ORDER — INSULIN ASPART 100 UNIT/ML ~~LOC~~ SOLN: 0.0000 [IU] | Freq: Every day | SUBCUTANEOUS | Status: DC

## 2018-05-03 MED ORDER — ACETAMINOPHEN 325 MG PO TABS
ORAL_TABLET | ORAL | Status: AC
  Filled 2018-05-03: qty 2

## 2018-05-03 MED ORDER — MIDAZOLAM HCL 2 MG/2ML IJ SOLN
INTRAMUSCULAR | Status: AC
  Filled 2018-05-03: qty 2

## 2018-05-03 MED ORDER — LIDOCAINE 1 % OPTIME INJ - NO CHARGE
INTRAMUSCULAR | Status: DC | PRN
  Administered 2018-05-03: 5 mL

## 2018-05-03 SURGICAL SUPPLY — 38 items
BAG DECANTER FOR FLEXI CONT (MISCELLANEOUS) ×3 IMPLANT
BRUSH SCRUB EZ  4% CHG (MISCELLANEOUS) ×1
BRUSH SCRUB EZ 4% CHG (MISCELLANEOUS) ×2 IMPLANT
CABLE SURG 12 DISP A/V CHANNEL (MISCELLANEOUS) ×3 IMPLANT
CANISTER SUCT 1200ML W/VALVE (MISCELLANEOUS) ×3 IMPLANT
CHLORAPREP W/TINT 26ML (MISCELLANEOUS) ×3 IMPLANT
COVER LIGHT HANDLE STERIS (MISCELLANEOUS) ×6 IMPLANT
COVER MAYO STAND STRL (DRAPES) ×3 IMPLANT
COVER WAND RF STERILE (DRAPES) ×3 IMPLANT
DRAPE C-ARM XRAY 36X54 (DRAPES) ×3 IMPLANT
DRSG TEGADERM 4X4.75 (GAUZE/BANDAGES/DRESSINGS) ×3 IMPLANT
DRSG TELFA 4X3 1S NADH ST (GAUZE/BANDAGES/DRESSINGS) ×3 IMPLANT
ELECT REM PT RETURN 9FT ADLT (ELECTROSURGICAL) ×3
ELECTRODE REM PT RTRN 9FT ADLT (ELECTROSURGICAL) ×2 IMPLANT
GLOVE BIO SURGEON STRL SZ7.5 (GLOVE) ×3 IMPLANT
GLOVE BIO SURGEON STRL SZ8 (GLOVE) ×3 IMPLANT
GOWN STRL REUS W/ TWL LRG LVL3 (GOWN DISPOSABLE) ×2 IMPLANT
GOWN STRL REUS W/ TWL XL LVL3 (GOWN DISPOSABLE) ×2 IMPLANT
GOWN STRL REUS W/TWL LRG LVL3 (GOWN DISPOSABLE) ×1
GOWN STRL REUS W/TWL XL LVL3 (GOWN DISPOSABLE) ×1
IMMOBILIZER SHDR MD LX WHT (SOFTGOODS) IMPLANT
IMMOBILIZER SHDR XL LX WHT (SOFTGOODS) IMPLANT
INTRO PACEMAKR LEAD 9FR 13CM (INTRODUCER) ×3
INTRO PACEMKR SHEATH II 7FR (MISCELLANEOUS) ×3
INTRODUCER PACEMKR LD 9FR 13CM (INTRODUCER) ×2 IMPLANT
INTRODUCER PACEMKR SHTH II 7FR (MISCELLANEOUS) ×2 IMPLANT
IPG PACE AZUR XT DR MRI W1DR01 (Pacemaker) ×2 IMPLANT
IV NS 500ML (IV SOLUTION) ×1
IV NS 500ML BAXH (IV SOLUTION) ×2 IMPLANT
KIT TURNOVER KIT A (KITS) ×3 IMPLANT
LABEL OR SOLS (LABEL) ×3 IMPLANT
LEAD CAPSURE NOVUS 5076-52CM (Lead) ×3 IMPLANT
LEAD CAPSURE NOVUS 5076-58CM (Lead) ×3 IMPLANT
MARKER SKIN DUAL TIP RULER LAB (MISCELLANEOUS) ×3 IMPLANT
PACE AZURE XT DR MRI W1DR01 (Pacemaker) ×3 IMPLANT
PACK PACE INSERTION (MISCELLANEOUS) ×3 IMPLANT
PAD ONESTEP ZOLL R SERIES ADT (MISCELLANEOUS) ×3 IMPLANT
SUT SILK 0 SH 30 (SUTURE) ×9 IMPLANT

## 2018-05-03 NOTE — Op Note (Signed)
[facility] Cardiology   05/03/2018                     4:25 PM  PATIENT:  [name]    PRE-OPERATIVE DIAGNOSIS:  Complete Heart Block  POST-OPERATIVE DIAGNOSIS:  Same  PROCEDURE:  INSERTION PACEMAKER, TEMPORARY PACEMAKER REMOVAL  SURGEON:  [name], MD    ANESTHESIA:     PREOPERATIVE INDICATIONS:  SILVIANO NEUSER Sr. is a  72 y.o. male with a diagnosis of Complete Heart Block who failed conservative measures and elected for surgical management.    The risks benefits and alternatives were discussed with the patient preoperatively including but not limited to the risks of infection, bleeding, cardiopulmonary complications, the need for revision surgery, among others, and the patient was willing to proceed.   OPERATIVE PROCEDURE: The patient was brought to the operating room in a fasting state.  The left pectoral region was prepped and draped in usual sterile manner.  Anesthesia was obtained 1% lidocaine locally.  A 6 cm incision was performed over the left pectoral region.  The pacemaker pocket was generated by electrocautery and blunt dissection.  Access was obtained to the left subclavian vein by fine-needle aspiration.  MRI compatible leads were positioned to the right ventricular apical septum ( Medtronic [id number] ) and right atrial appendage ( Medtronic [id number]  under fluoroscopic guidance.  After proper thresholds were obtained the leads were sutured in place.  The leads were connected to an MRI compatible dual-chamber rate responsive pacemaker generator ( Medtronic [id number] ).  Pacemaker pocket was irrigated with gentamicin solution.  The pacemaker generator was positioned into the pocket and the pocket was closed with 2-0 and 4-0 Vicryl, respectively.  Steri-Strips and pressure dressing were applied.  Postprocedural interrogation revealed appropriate dual-chamber atrial and ventricular sensing and pacing thresholds.  There were no periprocedural complications.

## 2018-05-03 NOTE — Anesthesia Procedure Notes (Signed)
Date/Time: 05/03/2018 3:10 PM Performed by: Johnna Acosta, CRNA Pre-anesthesia Checklist: Patient identified, Emergency Drugs available, Suction available, Timeout performed and Patient being monitored Patient Re-evaluated:Patient Re-evaluated prior to induction Oxygen Delivery Method: Simple face mask Preoxygenation: Pre-oxygenation with 100% oxygen Induction Type: IV induction

## 2018-05-03 NOTE — Anesthesia Post-op Follow-up Note (Signed)
Anesthesia QCDR form completed.        

## 2018-05-03 NOTE — Progress Notes (Signed)
[facility] Cardiology  SUBJECTIVE: Patient laying in bed, patiently waiting for permanent pacemaker with temporary pacemaker in right femoral vein   Vitals:   05/03/18 0300 05/03/18 0400 05/03/18 0500 05/03/18 0600  BP: (!) 164/76 (!) 180/88 (!) 142/68 (!) 164/75  Pulse: 61 (!) 59 (!) 59 (!) 59  Resp: 20 (!) 25 (!) 21 18  Temp:  98.4 F (36.9 C)    TempSrc:  Oral    SpO2: 95% 92% 95% 93%  Weight:      Height:         Intake/Output Summary (Last 24 hours) at 05/03/2018 1458 Last data filed at 05/03/2018 0400 Gross per 24 hour  Intake 142.67 ml  Output 1300 ml  Net -1157.33 ml      PHYSICAL EXAM  General: Well developed, well nourished, in no acute distress HEENT:  Normocephalic and atramatic Neck:  No JVD.  Lungs: Clear bilaterally to auscultation and percussion. Heart: HRRR . Normal S1 and S2 without gallops or murmurs.  Abdomen: Bowel sounds are positive, abdomen soft and non-tender  Msk:  Back normal, normal gait. Normal strength and tone for age. Extremities: No clubbing, cyanosis or edema.   Neuro: Alert and oriented X 3. Psych:  Good affect, responds appropriately   LABS: Basic Metabolic Panel: Recent Labs    05/01/18 0253 05/02/18 1304  NA 136 138  K 3.6 3.8  CL 105 103  CO2 25 25  GLUCOSE 141* 137*  BUN 23 22  CREATININE 0.88 0.87  CALCIUM 8.6* 9.8  MG  --  2.0  PHOS  --  3.0   Liver Function Tests: Recent Labs    05/02/18 1304  ALBUMIN 4.0   No results for input(s): LIPASE, AMYLASE in the last 72 hours. CBC: Recent Labs    05/01/18 0253  WBC 4.9  HGB 12.6*  HCT 38.6*  MCV 82.3  PLT 199   Cardiac Enzymes: Recent Labs    04/30/18 2039 05/01/18 0253 05/02/18 0035  TROPONINI 0.03* 0.03* 0.07*   BNP: Invalid input(s): POCBNP D-Dimer: No results for input(s): DDIMER in the last 72 hours. Hemoglobin A1C: No results for input(s): HGBA1C in the last 72 hours. Fasting Lipid Panel: No results for input(s): CHOL, HDL, LDLCALC, TRIG,  CHOLHDL, LDLDIRECT in the last 72 hours. Thyroid Function Tests: Recent Labs    05/01/18 0253  TSH 0.509   Anemia Panel: No results for input(s): VITAMINB12, FOLATE, FERRITIN, TIBC, IRON, RETICCTPCT in the last 72 hours.  No results found.   Echo LVEF 50 to 55%  TELEMETRY: Ventricular pacing:  ASSESSMENT AND PLAN:  Active Problems:   Bradycardia    1.  High-grade AV block with intermittent complete heart block, with temporary transvenous pacemaker right femoral vein  Recommendations  1.  Imminent pacemaker implantation.  Risk, benefits alternatives were explained and informed consent was obtained.   [name], MD, PhD, [facility] 05/03/2018 2:58 PM

## 2018-05-03 NOTE — Progress Notes (Signed)
[place] [facility] [place] NAME: [name]    MR#:  [id number]  DATE OF BIRTH:  1945-10-03  SUBJECTIVE:  Patient has no complaint, waiting for PPM placement today.  REVIEW OF SYSTEMS:  CONSTITUTIONAL: No fever, fatigue or weakness.  EYES: No blurred or double vision.  EARS, NOSE, AND THROAT: No tinnitus or ear pain.  RESPIRATORY: No cough, shortness of breath, wheezing or hemoptysis.  CARDIOVASCULAR: No chest pain, orthopnea, edema.  GASTROINTESTINAL: No nausea, vomiting, diarrhea or abdominal pain.  GENITOURINARY: No dysuria, hematuria.  ENDOCRINE: No polyuria, nocturia,  HEMATOLOGY: No anemia, easy bruising or bleeding SKIN: No rash or lesion. MUSCULOSKELETAL: No joint pain or arthritis.   NEUROLOGIC: No tingling, numbness, weakness.  PSYCHIATRY: No anxiety or depression.   ROS  DRUG ALLERGIES:  No Known Allergies  VITALS:  Blood pressure (!) 168/90, pulse 68, temperature (!) 97.2 F (36.2 C), resp. rate (!) 24, height 6\' 3"  (1.905 m), weight 117.3 kg, SpO2 100 %.  PHYSICAL EXAMINATION:  GENERAL:  72 y.o.-year-old patient lying in the bed with no acute distress.  Obesity. EYES: Pupils equal, round, reactive to light and accommodation. No scleral icterus. Extraocular muscles intact.  HEENT: Head atraumatic, normocephalic. Oropharynx and nasopharynx clear.  NECK:  Supple, no jugular venous distention. No thyroid enlargement, no tenderness.  LUNGS: Normal breath sounds bilaterally, no wheezing, rales,rhonchi or crepitation. No use of accessory muscles of respiration.  CARDIOVASCULAR: S1, S2 normal. No murmurs, rubs, or gallops.  ABDOMEN: Soft, nontender, nondistended. Bowel sounds present. No organomegaly or mass.  EXTREMITIES: No pedal edema, cyanosis, or clubbing.  NEUROLOGIC: Cranial nerves II through XII are intact. Muscle strength 5/5 in all extremities. Sensation intact. Gait not checked.  PSYCHIATRIC: The patient is alert and  oriented x 3.  SKIN: No obvious rash, lesion, or ulcer.   Physical Exam LABORATORY PANEL:   CBC Recent Labs  Lab 05/01/18 0253  WBC 4.9  HGB 12.6*  HCT 38.6*  PLT 199   ------------------------------------------------------------------------------------------------------------------  Chemistries  Recent Labs  Lab 04/30/18 1212  05/02/18 1304  NA 139   < > 138  K 3.5   < > 3.8  CL 104   < > 103  CO2 27   < > 25  GLUCOSE 148*   < > 137*  BUN 26*   < > 22  CREATININE 1.03   < > 0.87  CALCIUM 9.2   < > 9.8  MG 1.8  --  2.0  AST 19  --   --   ALT 20  --   --   ALKPHOS 80  --   --   BILITOT 0.7  --   --    < > = values in this interval not displayed.   ------------------------------------------------------------------------------------------------------------------  Cardiac Enzymes Recent Labs  Lab 05/01/18 0253 05/02/18 0035  TROPONINI 0.03* 0.07*   ------------------------------------------------------------------------------------------------------------------  RADIOLOGY:  Dg C-arm 1-60 Min-no Report  Result Date: 05/03/2018 Fluoroscopy was utilized by the requesting physician.  No radiographic interpretation.    ASSESSMENT AND PLAN:  72 year old male patient with history of hyperlipidemia, hypertension, diabetes mellitus type 2, GERD presented to the emergency room for dizziness  *Acute symptomatic bradycardia due to complete AV block Beta-blocker was discontinued Patient did rule out for acute coronary syndrome, continue telemetry monitoring, TSH normal. S/p temporary pacemaker placement. PPM today by Dr. Josefa Half.  *Chronic diabetes mellitus type 2 Controlled on current regiment  *Acute dehydration Solved with IV fluids   *  Acute Dizziness Secondary to bradycardia Resolved On precautions while in house  *Acute abnormal chest x-ray ?  Questionable infiltrate noted, patient without infectious symptomatology Normal procalcitonin level, check  two-view chest x-ray: Suboptimal inspiration accounts for bibasilar atelectasis. No acute cardiopulmonary disease otherwise.  Obesity.  Diet control and follow-up with PCP. All the records are reviewed and case discussed with Care Management/Social Workerr. Management plans discussed with the patient, his daughter and they are in agreement.  CODE STATUS: full  TOTAL TIME TAKING CARE OF THIS PATIENT: 26 minutes.  POSSIBLE D/C IN 1-2 DAYS, DEPENDING ON CLINICAL CONDITION.   [name] M.D on 05/03/2018   Between 7am to 6pm - Pager - [phone]  After 6pm go to [email or web address] - password [email or web address] [facility]  Office  [phone]  CC: Primary care physician; Ronnell Freshwater, NP  Note: This dictation was prepared with Dragon dictation along with smaller phrase technology. Any transcriptional errors that result from this process are unintentional.

## 2018-05-03 NOTE — Transfer of Care (Signed)
Immediate Anesthesia Transfer of Care Note  Patient: Gary Alar Sr.  Procedure(s) Performed: INSERTION PACEMAKER (Left Chest) TEMPORARY PACEMAKER REMOVAL (Right Groin)  Patient Location: PACU  Anesthesia Type:General  Level of Consciousness: awake and alert   Airway & Oxygen Therapy: Patient Spontanous Breathing and Patient connected to nasal cannula oxygen  Post-op Assessment: Report given to RN and Post -op Vital signs reviewed and stable  Post vital signs: Reviewed and stable  Last Vitals:  Vitals Value Taken Time  BP 157/83 05/03/2018  4:23 PM  Temp    Pulse 62 05/03/2018  4:24 PM  Resp    SpO2 100 % 05/03/2018  4:24 PM  Vitals shown include unvalidated device data.  Last Pain:  Vitals:   05/03/18 0400  TempSrc: Oral  PainSc: 0-No pain      Patients Stated Pain Goal: 3 (81/15/72 6203)  Complications: No apparent anesthesia complications

## 2018-05-03 NOTE — Progress Notes (Signed)
Follow up - Critical Care Medicine Note  Patient Details:    Gary KARAPETYAN Sr. is an 72 y.o. male. pleasant African-American male who is been transferred from to a to the ICU for close monitoring of bradycardia.  His heart rate is fluctuating between 30 and 50 bpm.  He has been given doses of atropine without any significant sustained improvement in his heart rate.  Patient is now status post temporary pacemaker placement.  Pending permanent pacemaker placement today  Lines, Airways, Drains:    Anti-infectives:  Anti-infectives (From admission, onward)   None      Microbiology: Results for orders placed or performed during the hospital encounter of 04/30/18  MRSA PCR Screening     Status: None   Collection Time: 05/02/18  6:31 AM  Result Value Ref Range Status   MRSA by PCR NEGATIVE NEGATIVE Final    Comment:        The GeneXpert MRSA Assay (FDA approved for NASAL specimens only), is one component of a comprehensive MRSA colonization surveillance program. It is not intended to diagnose MRSA infection nor to guide or monitor treatment for MRSA infections. Performed at [facility], [address] [place], [place] [address]     Studies: Dg Chest 2 View  Result Date: 05/01/2018 CLINICAL DATA:  Patient admitted with bradycardia. Follow-up LEFT basilar atelectasis versus pneumonia. EXAM: CHEST - 2 VIEW COMPARISON:  04/30/2018. FINDINGS: Suboptimal inspiration. Heart moderately enlarged. Mild atelectasis in the lung bases. Lungs otherwise clear. Normal pulmonary vascularity. No visible pleural effusions. IMPRESSION: 1. Suboptimal inspiration accounts for bibasilar atelectasis. No acute cardiopulmonary disease otherwise. 2. Cardiomegaly without pulmonary edema. Electronically Signed   By: Evangeline Dakin M.D.   On: 05/01/2018 16:48   Dg Chest Port 1 View  Result Date: 04/30/2018 CLINICAL DATA:  72 year old male with chest pain, nausea and vomiting EXAM: PORTABLE CHEST  1 VIEW COMPARISON:  None. FINDINGS: Cardiomegaly. The mediastinal contours are within normal limits. Inspiratory volumes are very low resulting in crowding of the pulmonary vasculature. There is mild pulmonary vascular congestion but no overt edema. Nonspecific left basilar airspace opacity may reflect atelectasis or infiltrate. No pneumothorax. No acute osseous abnormality. IMPRESSION: Very low inspiratory volumes. Patchy left basilar airspace opacity may reflect atelectasis and/or pneumonia. Mild vascular congestion without overt edema. Electronically Signed   By: Jacqulynn Cadet M.D.   On: 04/30/2018 12:56    Consults: Treatment Team:  [name], MD   Subjective:    Overnight Issues: Patient is doing well, no complaints this morning, pending permanent pacemaker placement  Objective:  Vital signs for last 24 hours: Temp:  [98 F (36.7 C)-99.3 F (37.4 C)] 98.4 F (36.9 C) (12/30 0400) Pulse Rate:  [56-61] 59 (12/30 0600) Resp:  [17-31] 18 (12/30 0600) BP: (130-183)/(68-112) 164/75 (12/30 0600) SpO2:  [90 %-98 %] 93 % (12/30 0600)  Hemodynamic parameters for last 24 hours:    Intake/Output from previous day: 12/29 0701 - 12/30 0700 In: 834.6 [P.O.:120; I.V.:714.6] Out: 4800 [Urine:4800]  Intake/Output this shift: No intake/output data recorded.  Vent settings for last 24 hours:    Physical Exam:  General: No acute distress HEENT: PERRLA, trachea midline, no JVD Neuro: Alert and oriented x4, no focal deficits Cardiovascular: Apical pulse bradycardic, S1-S2, no murmur regurg or gallop, +2 pulses bilaterally Lungs: Clear to auscultation bilaterally Abdomen: Nondistended, nontender, normal bowel sounds in all 4 quadrants Musculoskeletal: Positive range of motion in upper and lower extremities Skin: Warm and dry  Assessment/Plan:  Complete heart block.  Unresponsive to atropine, status post transvenous pacemaker placement.  Pending permanent pacemaker  today  Chronic medical issues to include hypertension, hyperlipidemia, diabetes, vertigo.  Hermelinda Dellen, DO  [name] 05/03/2018  *Care during the described time interval was provided by me and/or other providers on the critical care team.  I have reviewed this patient's available data, including medical history, events of note, physical examination and test results as part of my evaluation.

## 2018-05-03 NOTE — Anesthesia Preprocedure Evaluation (Signed)
Anesthesia Evaluation  Patient identified by MRN, date of birth, ID band Patient awake    Reviewed: Allergy & Precautions, NPO status , Patient's Chart, lab work & pertinent test results  History of Anesthesia Complications Negative for: history of anesthetic complications  Airway Mallampati: II  TM Distance: >3 FB Neck ROM: Full    Dental  (+) Missing   Pulmonary neg pulmonary ROS, neg sleep apnea, neg COPD,    breath sounds clear to auscultation- rhonchi (-) wheezing      Cardiovascular hypertension, (-) CAD, (-) Past MI, (-) Cardiac Stents and (-) CABG + dysrhythmias (complete heart block, temporary pacing wires in place) Atrial Fibrillation  Rhythm:Regular Rate:Normal - Systolic murmurs and - Diastolic murmurs    Neuro/Psych neg Seizures negative neurological ROS  negative psych ROS   GI/Hepatic Neg liver ROS, GERD  ,  Endo/Other  diabetes (borderline, diet controlled)  Renal/GU negative Renal ROS     Musculoskeletal negative musculoskeletal ROS (+)   Abdominal (+) + obese,   Peds  Hematology negative hematology ROS (+)   Anesthesia Other Findings Past Medical History: No date: Diabetes mellitus without complication (HCC) No date: GERD (gastroesophageal reflux disease) No date: Hyperlipidemia No date: Hypertension   Reproductive/Obstetrics                             Anesthesia Physical Anesthesia Plan  ASA: III  Anesthesia Plan: General   Post-op Pain Management:    Induction: Intravenous  PONV Risk Score and Plan: 1 and Propofol infusion  Airway Management Planned: Natural Airway  Additional Equipment:   Intra-op Plan:   Post-operative Plan:   Informed Consent: I have reviewed the patients History and Physical, chart, labs and discussed the procedure including the risks, benefits and alternatives for the proposed anesthesia with the patient or authorized  representative who has indicated his/her understanding and acceptance.   Dental advisory given  Plan Discussed with: CRNA and Anesthesiologist  Anesthesia Plan Comments:         Anesthesia Quick Evaluation

## 2018-05-03 NOTE — Progress Notes (Signed)
Subjective:  Patient describes episode of weakness fatigue chest pain last evening patient required 2 doses of atropine because of severe bradycardia what appeared to be complete heart block patient was then transferred to intensive care unit for closer monitoring with anticipation of temporary pacemaker placement  Objective:  Vital Signs in the last 24 hours: Temp:  [98 F (36.7 C)-99.3 F (37.4 C)] 98.4 F (36.9 C) (12/30 0400) Pulse Rate:  [44-61] 59 (12/30 0400) Resp:  [17-31] 25 (12/30 0400) BP: (130-183)/(68-112) 180/88 (12/30 0400) SpO2:  [90 %-98 %] 92 % (12/30 0400)  Intake/Output from previous day: 12/29 0701 - 12/30 0700 In: 834.6 [P.O.:120; I.V.:714.6] Out: 4800 [Urine:4800] Intake/Output from this shift: Total I/O In: 122.7 [P.O.:120; I.V.:2.7] Out: 900 [Urine:900]  Physical Exam: General appearance: appears stated age Neck: no adenopathy, no carotid bruit, no JVD, supple, symmetrical, trachea midline and thyroid not enlarged, symmetric, no tenderness/mass/nodules Lungs: clear to auscultation bilaterally Heart: regular rate and rhythm and Irregular rate rhythm severe bradycardia stock ejection murmur Abdomen: soft, non-tender; bowel sounds normal; no masses,  no organomegaly Extremities: extremities normal, atraumatic, no cyanosis or edema Pulses: 2+ and symmetric Skin: Skin color, texture, turgor normal. No rashes or lesions Neurologic: Alert and oriented X 3, normal strength and tone. Normal symmetric reflexes. Normal coordination and gait  Lab Results: Recent Labs    04/30/18 1212 05/01/18 0253  WBC 5.0 4.9  HGB 13.8 12.6*  PLT 206 199   Recent Labs    05/01/18 0253 05/02/18 1304  NA 136 138  K 3.6 3.8  CL 105 103  CO2 25 25  GLUCOSE 141* 137*  BUN 23 22  CREATININE 0.88 0.87   Recent Labs    05/01/18 0253 05/02/18 0035  TROPONINI 0.03* 0.07*   Hepatic Function Panel Recent Labs    04/30/18 1212 05/02/18 1304  PROT 6.9  --   ALBUMIN  3.9 4.0  AST 19  --   ALT 20  --   ALKPHOS 80  --   BILITOT 0.7  --    No results for input(s): CHOL in the last 72 hours. No results for input(s): PROTIME in the last 72 hours.  Imaging: Imaging results have been reviewed  Cardiac Studies:  Assessment/Plan:  Angina Arrhythmia Atrial Fibrillation Chest Pain Shortness of Breath  Hypertension High-grade AV block second as well as third degree Symptomatic bradycardia Obesity . Plan Agree with transfer to intensive care unit Commend temporary  pacemaker because of complete heart block symptomatic bradycardia Continue hypertension management control Recommend cardiac cath for assessment of recent anginal symptoms We will schedule for inpatient permanent pacemaker placement without pressures on Monday  LOS: 3 days    [name] [name] 05/03/2018, 6:39 AM

## 2018-05-03 NOTE — Progress Notes (Signed)
*  PRELIMINARY RESULTS* Echocardiogram 2D Echocardiogram has been performed.  [name] 05/03/2018, 12:43 PM

## 2018-05-04 ENCOUNTER — Encounter: Payer: Self-pay | Admitting: Cardiology

## 2018-05-04 DIAGNOSIS — Z23 Encounter for immunization: Secondary | ICD-10-CM | POA: Diagnosis not present

## 2018-05-04 DIAGNOSIS — R42 Dizziness and giddiness: Secondary | ICD-10-CM

## 2018-05-04 LAB — CBC WITH DIFFERENTIAL/PLATELET
Abs Immature Granulocytes: 0.02 10*3/uL (ref 0.00–0.07)
BASOS PCT: 1 %
Basophils Absolute: 0 10*3/uL (ref 0.0–0.1)
Eosinophils Absolute: 0.2 10*3/uL (ref 0.0–0.5)
Eosinophils Relative: 3 %
HCT: 44.1 % (ref 39.0–52.0)
Hemoglobin: 14.6 g/dL (ref 13.0–17.0)
Immature Granulocytes: 0 %
Lymphocytes Relative: 19 %
Lymphs Abs: 1.5 10*3/uL (ref 0.7–4.0)
MCH: 26.6 pg (ref 26.0–34.0)
MCHC: 33.1 g/dL (ref 30.0–36.0)
MCV: 80.5 fL (ref 80.0–100.0)
MONOS PCT: 11 %
Monocytes Absolute: 0.9 10*3/uL (ref 0.1–1.0)
Neutro Abs: 5.5 10*3/uL (ref 1.7–7.7)
Neutrophils Relative %: 66 %
Platelets: 206 10*3/uL (ref 150–400)
RBC: 5.48 MIL/uL (ref 4.22–5.81)
RDW: 13.9 % (ref 11.5–15.5)
WBC: 8.3 10*3/uL (ref 4.0–10.5)
nRBC: 0 % (ref 0.0–0.2)

## 2018-05-04 LAB — GLUCOSE, CAPILLARY: Glucose-Capillary: 141 mg/dL — ABNORMAL HIGH (ref 70–99)

## 2018-05-04 LAB — BASIC METABOLIC PANEL
Anion gap: 8 (ref 5–15)
BUN: 22 mg/dL (ref 8–23)
CO2: 26 mmol/L (ref 22–32)
Calcium: 9.6 mg/dL (ref 8.9–10.3)
Chloride: 101 mmol/L (ref 98–111)
Creatinine, Ser: 0.88 mg/dL (ref 0.61–1.24)
GFR calc Af Amer: >60 mL/min (ref >60)
GFR calc non Af Amer: >60 mL/min (ref >60)
GLUCOSE: 149 mg/dL — AB (ref 70–99)
Potassium: 3.8 mmol/L (ref 3.5–5.1)
Sodium: 135 mmol/L (ref 135–145)

## 2018-05-04 LAB — MAGNESIUM: Magnesium: 2 mg/dL (ref 1.7–2.4)

## 2018-05-04 MED ORDER — CEPHALEXIN 500 MG PO CAPS: 500.0000 mg | ORAL_CAPSULE | Freq: Two times a day (BID) | ORAL | Status: DC

## 2018-05-04 MED ORDER — CEPHALEXIN 500 MG PO CAPS: 500.0000 mg | ORAL_CAPSULE | Freq: Two times a day (BID) | ORAL | 0 refills | Status: DC

## 2018-05-04 MED ORDER — CEFAZOLIN SODIUM-DEXTROSE 1-4 GM/50ML-% IV SOLN
1.0000 g | Freq: Four times a day (QID) | INTRAVENOUS | Status: DC
  Administered 2018-05-04: 1 g via INTRAVENOUS
  Filled 2018-05-04 (×3): qty 50

## 2018-05-04 NOTE — Progress Notes (Signed)
Discharged to home with daughter.  Follow up appointments made.  Keflex prescription given.  Instructions re care of cath site and pacemaker site given.

## 2018-05-04 NOTE — Discharge Summary (Signed)
[facility] NAME: [name]    MR#:  [id number]  DATE OF BIRTH:  03/27/1946  DATE OF ADMISSION:  04/30/2018   ADMITTING PHYSICIAN: Saundra Shelling, MD  DATE OF DISCHARGE: 05/04/2018 12:11 PM  PRIMARY CARE PHYSICIAN: Ronnell Freshwater, NP   ADMISSION DIAGNOSIS:  Dizziness [R42] Bradycardia [R00.1] DISCHARGE DIAGNOSIS:  Active Problems:   Bradycardia  SECONDARY DIAGNOSIS:   Past Medical History:  Diagnosis Date  . Diabetes mellitus without complication ([place])   . GERD (gastroesophageal reflux disease)   . Hyperlipidemia   . Hypertension    HOSPITAL COURSE:  72 year old male patient with history of hyperlipidemia, hypertension, diabetes mellitus type 2, GERD presented to the emergency room for dizziness  *Acute symptomatic bradycardia due to complete AV block Beta-blocker was discontinued Patient did rule out for acute coronary syndrome, continue telemetry monitoring, TSH normal. S/p temporary pacemaker placement. S/p PPM by Dr. Josefa Half.  Keflex twice daily for 7 days per Dr. Saralyn Pilar. Bradycardia improved.  *Chronic diabetes mellitus type 2 Controlled on current regiment  Hypertension.  Continue home hypertension medication except Coreg.  *Acute dehydration Solved with IV fluids   *Acute Dizziness Secondary to bradycardia Resolved On precautions while in house  *Acute abnormal chest x-ray ?  Questionable infiltrate noted, patient without infectious symptomatology Normal procalcitonin level, check two-view chest x-ray: Suboptimal inspiration accounts for bibasilar atelectasis. No acute cardiopulmonary disease otherwise.  Obesity.  Diet control and follow-up with PCP.  Discussed with Dr. Clayborn Bigness. DISCHARGE CONDITIONS:  Stable, discharged to home today. CONSULTS OBTAINED:  Treatment Team:  [name], MD [name], MD DRUG ALLERGIES:  No Known Allergies DISCHARGE  MEDICATIONS:   Allergies as of 05/04/2018   No Known Allergies     Medication List    STOP taking these medications   carvedilol 12.5 MG tablet Commonly known as:  COREG     TAKE these medications   allopurinol 300 MG tablet Commonly known as:  ZYLOPRIM TAKE 1 TABLET BY MOUTH AT BEDTIME FOR GOUT What changed:  See the new instructions.   amLODipine 10 MG tablet Commonly known as:  NORVASC Take 1 tab po daily What changed:    how much to take  how to take this  when to take this  additional instructions   cephALEXin 500 MG capsule Commonly known as:  KEFLEX Take 1 capsule (500 mg total) by mouth every 12 (twelve) hours.   colchicine 0.6 MG tablet 1 TAB TWICE DAILY X 3 DAYS FOR GOUT FLARE UP. OTHERWISE ONCE DAILY FOR PREVENTION What changed:    how much to take  how to take this  when to take this  additional instructions   furosemide 20 MG tablet Commonly known as:  LASIX Take 20 mg by mouth daily.   losartan-hydrochlorothiazide 100-12.5 MG tablet Commonly known as:  HYZAAR TAKE 1 TABLET BY MOUTH EVERY MORNING FOR BREAKFAST What changed:    how much to take  how to take this  when to take this  additional instructions   sildenafil 20 MG tablet Commonly known as:  REVATIO 1-3 tablets 1 hour prior to intercourse   tamsulosin 0.4 MG Caps capsule Commonly known as:  FLOMAX TAKE 1 CAPSULE (0.4 MG TOTAL) BY MOUTH DAILY. What changed:  See the new instructions.        DISCHARGE INSTRUCTIONS:  See AVS.  If you experience worsening of your admission symptoms, develop shortness of breath, life threatening emergency, suicidal  or homicidal thoughts you must seek medical attention immediately by calling 911 or calling your MD immediately  if symptoms less severe.  You Must read complete instructions/literature along with all the possible adverse reactions/side effects for all the Medicines you take and that have been prescribed to you. Take any new  Medicines after you have completely understood and accpet all the possible adverse reactions/side effects.   Please note  You were cared for by a hospitalist during your hospital stay. If you have any questions about your discharge medications or the care you received while you were in the hospital after you are discharged, you can call the unit and asked to speak with the hospitalist on call if the hospitalist that took care of you is not available. Once you are discharged, your primary care physician will handle any further medical issues. Please note that NO REFILLS for any discharge medications will be authorized once you are discharged, as it is imperative that you return to your primary care physician (or establish a relationship with a primary care physician if you do not have one) for your aftercare needs so that they can reassess your need for medications and monitor your lab values.    On the day of Discharge:  VITAL SIGNS:  Blood pressure (!) 163/76, pulse 65, temperature 98.5 F (36.9 C), temperature source Oral, resp. rate 20, height 6\' 3"  (1.905 m), weight 110.6 kg, SpO2 97 %. PHYSICAL EXAMINATION:  GENERAL:  72 y.o.-year-old patient lying in the bed with no acute distress.  Obesity. EYES: Pupils equal, round, reactive to light and accommodation. No scleral icterus. Extraocular muscles intact.  HEENT: Head atraumatic, normocephalic. Oropharynx and nasopharynx clear.  NECK:  Supple, no jugular venous distention. No thyroid enlargement, no tenderness.  LUNGS: Normal breath sounds bilaterally, no wheezing, rales,rhonchi or crepitation. No use of accessory muscles of respiration.  CARDIOVASCULAR: S1, S2 normal. No murmurs, rubs, or gallops.  ABDOMEN: Soft, non-tender, non-distended. Bowel sounds present. No organomegaly or mass.  EXTREMITIES: No pedal edema, cyanosis, or clubbing.  NEUROLOGIC: Cranial nerves II through XII are intact. Muscle strength 5/5 in all extremities. Sensation  intact. Gait not checked.  PSYCHIATRIC: The patient is alert and oriented x 3.  SKIN: No obvious rash, lesion, or ulcer.  DATA REVIEW:   CBC Recent Labs  Lab 05/04/18 0426  WBC 8.3  HGB 14.6  HCT 44.1  PLT 206    Chemistries  Recent Labs  Lab 04/30/18 1212  05/04/18 0426  NA 139   < > 135  K 3.5   < > 3.8  CL 104   < > 101  CO2 27   < > 26  GLUCOSE 148*   < > 149*  BUN 26*   < > 22  CREATININE 1.03   < > 0.88  CALCIUM 9.2   < > 9.6  MG 1.8   < > 2.0  AST 19  --   --   ALT 20  --   --   ALKPHOS 80  --   --   BILITOT 0.7  --   --    < > = values in this interval not displayed.     Microbiology Results  Results for orders placed or performed during the hospital encounter of 04/30/18  MRSA PCR Screening     Status: None   Collection Time: 05/02/18  6:31 AM  Result Value Ref Range Status   MRSA by PCR NEGATIVE NEGATIVE Final  Comment:        The GeneXpert MRSA Assay (FDA approved for NASAL specimens only), is one component of a comprehensive MRSA colonization surveillance program. It is not intended to diagnose MRSA infection nor to guide or monitor treatment for MRSA infections. Performed at [facility], [address] [place], [place] [address]     RADIOLOGY:  Dg Chest Port 1 View  Result Date: 05/03/2018 CLINICAL DATA:  Bradycardia. Pacemaker insertion. EXAM: PORTABLE CHEST 1 VIEW COMPARISON:  Chest x-ray dated 05/01/2018 FINDINGS: There is mild cardiomegaly. Pulmonary vascularity is within normal limits considering AP portable technique and shallow inspiration. Lungs are clear. Pacemaker in place. No pneumothorax. IMPRESSION: No acute cardiopulmonary findings. Mild cardiomegaly. Electronically Signed   By: Lorriane Shire M.D.   On: 05/03/2018 17:59   Dg C-arm 1-60 Min-no Report  Result Date: 05/03/2018 Fluoroscopy was utilized by the requesting physician.  No radiographic interpretation.     Management plans discussed with the  patient, family and they are in agreement.  CODE STATUS: Full Code   TOTAL TIME TAKING CARE OF THIS PATIENT: 33 minutes.    Demetrios Loll M.D on 05/04/2018 at 2:11 PM  Between 7am to 6pm - Pager - [phone]  After 6pm go to [email or web address] - Proofreader  [employer] [facility]  Office  [phone]  CC: Primary care physician; Ronnell Freshwater, NP   Note: This dictation was prepared with Dragon dictation along with smaller phrase technology. Any transcriptional errors that result from this process are unintentional.

## 2018-05-04 NOTE — Care Management Important Message (Signed)
Copy of signed Medicare IM left with patient in room. 

## 2018-05-04 NOTE — Progress Notes (Signed)
[facility] Cardiology  SUBJECTIVE: Patient laying in bed, reports feeling well, denies chest pain or shortness of breath   Vitals:   05/03/18 2345 05/04/18 0450 05/04/18 0733 05/04/18 0734  BP: (!) 155/90 (!) 141/67 (!) 163/76 (!) 163/76  Pulse: 72 64 63 65  Resp: 20 20 20 20   Temp: 98.3 F (36.8 C) 98.3 F (36.8 C) 98.5 F (36.9 C) 98.5 F (36.9 C)  TempSrc: Oral Oral Oral Oral  SpO2: 95% 92% 97% 97%  Weight: 111.8 kg 110.6 kg    Height: 6\' 3"  (1.905 m)        Intake/Output Summary (Last 24 hours) at 05/04/2018 0818 Last data filed at 05/03/2018 2137 Gross per 24 hour  Intake 3 ml  Output 10 ml  Net -7 ml      PHYSICAL EXAM  General: Well developed, well nourished, in no acute distress HEENT:  Normocephalic and atramatic Neck:  No JVD.  Lungs: Clear bilaterally to auscultation and percussion. Heart: HRRR . Normal S1 and S2 without gallops or murmurs.  Abdomen: Bowel sounds are positive, abdomen soft and non-tender  Msk:  Back normal, normal gait. Normal strength and tone for age. Extremities: No clubbing, cyanosis or edema.   Neuro: Alert and oriented X 3. Psych:  Good affect, responds appropriately   LABS: Basic Metabolic Panel: Recent Labs    05/02/18 1304 05/04/18 0426  NA 138 135  K 3.8 3.8  CL 103 101  CO2 25 26  GLUCOSE 137* 149*  BUN 22 22  CREATININE 0.87 0.88  CALCIUM 9.8 9.6  MG 2.0 2.0  PHOS 3.0  --    Liver Function Tests: Recent Labs    05/02/18 1304  ALBUMIN 4.0   No results for input(s): LIPASE, AMYLASE in the last 72 hours. CBC: Recent Labs    05/04/18 0426  WBC 8.3  NEUTROABS 5.5  HGB 14.6  HCT 44.1  MCV 80.5  PLT 206   Cardiac Enzymes: Recent Labs    05/02/18 0035  TROPONINI 0.07*   BNP: Invalid input(s): POCBNP D-Dimer: No results for input(s): DDIMER in the last 72 hours. Hemoglobin A1C: No results for input(s): HGBA1C in the last 72 hours. Fasting Lipid Panel: No results for input(s): CHOL, HDL, LDLCALC, TRIG,  CHOLHDL, LDLDIRECT in the last 72 hours. Thyroid Function Tests: No results for input(s): TSH, T4TOTAL, T3FREE, THYROIDAB in the last 72 hours.  Invalid input(s): FREET3 Anemia Panel: No results for input(s): VITAMINB12, FOLATE, FERRITIN, TIBC, IRON, RETICCTPCT in the last 72 hours.  Dg Chest Port 1 View  Result Date: 05/03/2018 CLINICAL DATA:  Bradycardia. Pacemaker insertion. EXAM: PORTABLE CHEST 1 VIEW COMPARISON:  Chest x-ray dated 05/01/2018 FINDINGS: There is mild cardiomegaly. Pulmonary vascularity is within normal limits considering AP portable technique and shallow inspiration. Lungs are clear. Pacemaker in place. No pneumothorax. IMPRESSION: No acute cardiopulmonary findings. Mild cardiomegaly. Electronically Signed   By: Lorriane Shire M.D.   On: 05/03/2018 17:59   Dg C-arm 1-60 Min-no Report  Result Date: 05/03/2018 Fluoroscopy was utilized by the requesting physician.  No radiographic interpretation.     Echo LVEF 50 to 55%  TELEMETRY: Atrial sensing with ventricular pacing:  ASSESSMENT AND PLAN:  Active Problems:   Bradycardia    1.  High-grade AV block with second-degree and third-degree heart block, status post dual-chamber pacemaker 2.  Insignificant coronary artery disease by cardiac catheterization   Recommendations  1.  Agree with current therapy 2.  Ambulate  3.  Discharge home later  today  (Keflex 500 mg twice daily for 7 days)  Gary Cowman, MD, PhD, [facility] 05/04/2018 8:18 AM

## 2018-05-06 ENCOUNTER — Ambulatory Visit: Payer: Self-pay | Admitting: Adult Health

## 2018-05-08 ENCOUNTER — Emergency Department: Payer: Medicare Other

## 2018-05-08 ENCOUNTER — Emergency Department
Admission: EM | Admit: 2018-05-08 | Discharge: 2018-05-08 | Disposition: A | Discharge status: Home / Self Care | Payer: Medicare Other | Attending: Emergency Medicine | Admitting: Emergency Medicine

## 2018-05-08 ENCOUNTER — Other Ambulatory Visit: Payer: Self-pay

## 2018-05-08 DIAGNOSIS — I10 Essential (primary) hypertension: Secondary | ICD-10-CM | POA: Diagnosis not present

## 2018-05-08 DIAGNOSIS — J9811 Atelectasis: Secondary | ICD-10-CM | POA: Diagnosis not present

## 2018-05-08 DIAGNOSIS — R079 Chest pain, unspecified: Secondary | ICD-10-CM | POA: Diagnosis not present

## 2018-05-08 DIAGNOSIS — E119 Type 2 diabetes mellitus without complications: Secondary | ICD-10-CM | POA: Insufficient documentation

## 2018-05-08 DIAGNOSIS — R0602 Shortness of breath: Secondary | ICD-10-CM

## 2018-05-08 DIAGNOSIS — K219 Gastro-esophageal reflux disease without esophagitis: Secondary | ICD-10-CM

## 2018-05-08 DIAGNOSIS — J939 Pneumothorax, unspecified: Secondary | ICD-10-CM | POA: Diagnosis not present

## 2018-05-08 DIAGNOSIS — Z8546 Personal history of malignant neoplasm of prostate: Secondary | ICD-10-CM | POA: Diagnosis not present

## 2018-05-08 DIAGNOSIS — R918 Other nonspecific abnormal finding of lung field: Secondary | ICD-10-CM | POA: Diagnosis not present

## 2018-05-08 LAB — BASIC METABOLIC PANEL
Anion gap: 9 (ref 5–15)
BUN: 16 mg/dL (ref 8–23)
CO2: 27 mmol/L (ref 22–32)
Calcium: 9.4 mg/dL (ref 8.9–10.3)
Chloride: 97 mmol/L — ABNORMAL LOW (ref 98–111)
Creatinine, Ser: 0.97 mg/dL (ref 0.61–1.24)
GFR calc Af Amer: >60 mL/min (ref >60)
GFR calc non Af Amer: >60 mL/min (ref >60)
Glucose, Bld: 146 mg/dL — ABNORMAL HIGH (ref 70–99)
Potassium: 3.1 mmol/L — ABNORMAL LOW (ref 3.5–5.1)
Sodium: 133 mmol/L — ABNORMAL LOW (ref 135–145)

## 2018-05-08 LAB — CBC
HCT: 40 % (ref 39.0–52.0)
Hemoglobin: 12.9 g/dL — ABNORMAL LOW (ref 13.0–17.0)
MCH: 26.4 pg (ref 26.0–34.0)
MCHC: 32.3 g/dL (ref 30.0–36.0)
MCV: 82 fL (ref 80.0–100.0)
Platelets: 226 10*3/uL (ref 150–400)
RBC: 4.88 MIL/uL (ref 4.22–5.81)
RDW: 13.4 % (ref 11.5–15.5)
WBC: 8.9 10*3/uL (ref 4.0–10.5)
nRBC: 0 % (ref 0.0–0.2)

## 2018-05-08 LAB — TROPONIN I
TROPONIN I: 0.03 ng/mL — AB (ref <0.03)
Troponin I: 0.03 ng/mL (ref <0.03)

## 2018-05-08 MED ORDER — LIDOCAINE VISCOUS HCL 2 % MT SOLN
15.0000 mL | Freq: Once | OROMUCOSAL | Status: DC
  Filled 2018-05-08: qty 15

## 2018-05-08 MED ORDER — ALUM & MAG HYDROXIDE-SIMETH 200-200-20 MG/5ML PO SUSP
30.0000 mL | Freq: Once | ORAL | Status: AC
  Administered 2018-05-08: 30 mL via ORAL
  Filled 2018-05-08: qty 30

## 2018-05-08 MED ORDER — SUCRALFATE 1 G PO TABS: 1.0000 g | ORAL_TABLET | Freq: Four times a day (QID) | ORAL | 1 refills | Status: DC

## 2018-05-08 MED ORDER — IOHEXOL 350 MG/ML SOLN
75.0000 mL | Freq: Once | INTRAVENOUS | Status: AC | PRN
  Administered 2018-05-08: 100 mL via INTRAVENOUS

## 2018-05-08 MED ORDER — LIDOCAINE VISCOUS HCL 2 % MT SOLN: 15.0000 mL | OROMUCOSAL | 0 refills | Status: DC | PRN

## 2018-05-08 MED ORDER — LIDOCAINE VISCOUS HCL 2 % MT SOLN
15.0000 mL | Freq: Once | OROMUCOSAL | Status: AC
  Administered 2018-05-08: 15 mL via ORAL
  Filled 2018-05-08: qty 15

## 2018-05-08 NOTE — ED Provider Notes (Signed)
[facility] Emergency Department Provider Note       Time seen: ----------------------------------------- 7:58 PM on 05/08/2018 -----------------------------------------   I have reviewed the triage vital signs and the nursing notes.  HISTORY   Chief Complaint Shortness of Breath    HPI [name] Sr. is a 73 y.o. male with a history of diabetes, GERD, hyperlipidemia, hypertension who presents to the ED for digestion and chest discomfort.  Patient also has headache and shortness of breath.  Patient states shortness of breath was worse last night when he was lying down.  He had a pacemaker placed on Monday by Dr. Saralyn Pilar.  Past Medical History:  Diagnosis Date  . Diabetes mellitus without complication ([place])   . GERD (gastroesophageal reflux disease)   . Hyperlipidemia   . Hypertension     Patient Active Problem List   Diagnosis Date Noted  . Bradycardia 04/30/2018  . Hypertension 05/18/2017  . GERD (gastroesophageal reflux disease) 05/18/2017  . Prostate cancer ([place]) 06/26/2016  . Anejaculation 10/18/2012  . Erectile dysfunction following radiation therapy 10/18/2012  . Microscopic hematuria 04/19/2012  . Testicular hypofunction 04/19/2012    Past Surgical History:  Procedure Laterality Date  . CORONARY/GRAFT ACUTE MI REVASCULARIZATION N/A 05/02/2018   Procedure: Coronary/Graft Acute MI Revascularization;  Surgeon: [name], MD;  Location: [place] [facility];  Service: Cardiovascular;  Laterality: N/A;  . HERNIA REPAIR    . LEFT HEART CATH AND CORONARY ANGIOGRAPHY N/A 05/02/2018   Procedure: LEFT HEART CATH AND CORONARY ANGIOGRAPHY;  Surgeon: [name], MD;  Location: [place] [facility];  Service: Cardiovascular;  Laterality: N/A;  . PACEMAKER INSERTION Left 05/03/2018   Procedure: INSERTION PACEMAKER;  Surgeon: [name], MD;  Location: [facility];  Service: Cardiovascular;  Laterality: Left;  . TEMPORARY  PACEMAKER N/A 05/02/2018   Procedure: TEMPORARY PACEMAKER;  Surgeon: [name], MD;  Location: [place] [facility];  Service: Cardiovascular;  Laterality: N/A;  . TEMPORARY PACEMAKER Right 05/03/2018   Procedure: TEMPORARY PACEMAKER REMOVAL;  Surgeon: [name], MD;  Location: [facility];  Service: Cardiovascular;  Laterality: Right;  . TONSILLECTOMY Bilateral    1992    Allergies Patient has no known allergies.  Social History Social History   Tobacco Use  . Smoking status: Never Smoker  . Smokeless tobacco: Never Used  Substance Use Topics  . Alcohol use: No    Frequency: Never  . Drug use: No   Review of Systems Constitutional: Negative for fever. Cardiovascular: Positive for chest pain Respiratory: Positive for shortness of breath Gastrointestinal: Negative for abdominal pain, vomiting and diarrhea. Musculoskeletal: Negative for back pain. Skin: Negative for rash. Neurological: Negative for headaches, focal weakness or numbness.  All systems negative/normal/unremarkable except as stated in the HPI  ____________________________________________   PHYSICAL EXAM:  VITAL SIGNS: ED Triage Vitals  Enc Vitals Group     BP 05/08/18 1821 132/64     Pulse Rate 05/08/18 1821 65     Resp 05/08/18 1821 (!) 22     Temp 05/08/18 1821 99 F (37.2 C)     Temp Source 05/08/18 1821 Oral     SpO2 05/08/18 1821 98 %     Weight 05/08/18 1822 250 lb (113.4 kg)     Height 05/08/18 1822 6\' 3"  (1.905 m)     Head Circumference --      Peak Flow --      Pain Score 05/08/18 1821 5     Pain Loc --  Pain Edu? --      Excl. in [place]? --    Constitutional: Alert and oriented. Well appearing and in no distress. Eyes: Conjunctivae are normal. Normal extraocular movements. ENT      Head: Normocephalic and atraumatic.      Nose: No congestion/rhinnorhea.      Mouth/Throat: Mucous membranes are moist.      Neck: No stridor. Cardiovascular: Normal rate, regular rhythm.  No murmurs, rubs, or gallops. Respiratory: Normal respiratory effort without tachypnea nor retractions. Breath sounds are clear and equal bilaterally. No wheezes/rales/rhonchi. Gastrointestinal: Soft and nontender. Normal bowel sounds Musculoskeletal: Nontender with normal range of motion in extremities. No lower extremity tenderness nor edema. Neurologic:  Normal speech and language. No gross focal neurologic deficits are appreciated.  Skin:  Skin is warm, dry.  Left chest pacemaker insertion site is clean dry and intact. Psychiatric: Mood and affect are normal. Speech and behavior are normal.  ____________________________________________  EKG: Interpreted by me.  Ventricular paced rhythm with a rate of 61 bpm, normal pacemaker function.  ____________________________________________  ED COURSE:  As part of my medical decision making, I reviewed the following data within the [place] History obtained from family if available, nursing notes, old chart and ekg, as well as notes from prior ED visits. Patient presented for chest pain as well as headache and shortness of breath, we will assess with labs and imaging as indicated at this time.   Procedures ____________________________________________   LABS (pertinent positives/negatives)  Labs Reviewed  BASIC METABOLIC PANEL - Abnormal; Notable for the following components:      Result Value   Sodium 133 (*)    Potassium 3.1 (*)    Chloride 97 (*)    Glucose, Bld 146 (*)    All other components within normal limits  CBC - Abnormal; Notable for the following components:   Hemoglobin 12.9 (*)    All other components within normal limits  TROPONIN I - Abnormal; Notable for the following components:   Troponin I 0.03 (*)    All other components within normal limits  TROPONIN I - Abnormal; Notable for the following components:   Troponin I 0.03 (*)    All other components within normal limits    RADIOLOGY Images  were viewed by me Chest x-ray IMPRESSION: 1. Retrocardiac airspace opacity on the right, probably in the right lower lobe, favoring atelectasis or pneumonia. 2. Linear atelectasis along the left hemidiaphragm. 3. Moderate enlargement of the cardiopericardial silhouette, without edema. Dual lead pacer noted. CTA chest IMPRESSION: 1. No evidence of a pulmonary embolism. 2. Bilateral lower lobe opacity, right greater than left, consistent with atelectasis. No convincing pneumonia. No pulmonary edema. 3. Mild cardiomegaly. 4. Well-positioned pacemaker leads. ____________________________________________   DIFFERENTIAL DIAGNOSIS   MI, unstable angina, PE, pneumothorax, influenza, pneumonia  FINAL ASSESSMENT AND PLAN  Chest pain, shortness of breath   Plan: The patient had presented for chest pain or difficulty breathing. Patient's labs revealed a chronically elevated troponin, otherwise no acute process. Patient's imaging was suggestive of a retrocardiac airspace opacity on the right.  CT angiogram of the chest was performed which did not show a pneumonia, rather atelectasis.  He does not have pulmonary edema, the pacemaker leads are well-positioned.  His repeat troponin was negative.  Patient had nonspecific symptoms of which I am unclear as to the exact etiology.  Patient describes an indigestion all day that got better with Tums and Gas-X.  He reports he had a  bowel movement today which made his headache feel better.  He likely is having GERD.  He will be discharged with viscous lidocaine and Carafate.  I have advised him to see his cardiologist on Monday without fail.   Gary Aly, MD    Note: This note was generated in part or whole with voice recognition software. Voice recognition is usually quite accurate but there are transcription errors that can and very often do occur. I apologize for any typographical errors that were not detected and corrected.     Earleen Newport, MD 05/08/18 [phone]

## 2018-05-08 NOTE — ED Notes (Signed)
Pt updated about wait time. Pt and family understanding at this time and pt appears in NAD.

## 2018-05-08 NOTE — ED Notes (Signed)
MD Williams at bedside.  

## 2018-05-08 NOTE — ED Triage Notes (Addendum)
Pt arrived via POV with reports of indigestion feeling mid-sternum, headache and shortness of breath. Pt states the shortness of breath was worse last night when lying down, pt states he had pacemaker placed on Monday by Dr. Saralyn Pilar.   Pt has [employer] pacemaker per MD's note, pt does not know what brand pacemaker is.

## 2018-05-12 NOTE — Anesthesia Postprocedure Evaluation (Signed)
Anesthesia Post Note  Patient: [name].  Procedure(s) Performed: INSERTION PACEMAKER (Left Chest) TEMPORARY PACEMAKER REMOVAL (Right Groin)  Patient location during evaluation: PACU Anesthesia Type: MAC Level of consciousness: awake and alert and oriented Pain management: pain level controlled Vital Signs Assessment: post-procedure vital signs reviewed and stable Respiratory status: spontaneous breathing Cardiovascular status: blood pressure returned to baseline Anesthetic complications: no     Last Vitals:  Vitals:   05/04/18 0733 05/04/18 0734  BP: (!) 163/76 (!) 163/76  Pulse: 63 65  Resp: 20 20  Temp: 36.9 C 36.9 C  SpO2: 97% 97%    Last Pain:  Vitals:   05/04/18 0834  TempSrc:   PainSc: 2                  Korayma Hagwood

## 2018-05-17 ENCOUNTER — Ambulatory Visit: Payer: Self-pay | Admitting: Nurse Practitioner

## 2018-05-18 ENCOUNTER — Encounter: Payer: Self-pay | Admitting: Nurse Practitioner

## 2018-05-18 ENCOUNTER — Ambulatory Visit (INDEPENDENT_AMBULATORY_CARE_PROVIDER_SITE_OTHER): Payer: Medicare Other | Admitting: Nurse Practitioner

## 2018-05-18 VITALS — BP 151/83 | HR 74 | Resp 16 | Ht 75.5 in | Wt 250.0 lb

## 2018-05-18 DIAGNOSIS — R001 Bradycardia, unspecified: Secondary | ICD-10-CM | POA: Diagnosis not present

## 2018-05-18 DIAGNOSIS — K219 Gastro-esophageal reflux disease without esophagitis: Secondary | ICD-10-CM

## 2018-05-18 DIAGNOSIS — I2510 Atherosclerotic heart disease of native coronary artery without angina pectoris: Secondary | ICD-10-CM | POA: Diagnosis not present

## 2018-05-18 DIAGNOSIS — I10 Essential (primary) hypertension: Secondary | ICD-10-CM | POA: Diagnosis not present

## 2018-05-18 DIAGNOSIS — J40 Bronchitis, not specified as acute or chronic: Secondary | ICD-10-CM | POA: Diagnosis not present

## 2018-05-18 DIAGNOSIS — I495 Sick sinus syndrome: Secondary | ICD-10-CM | POA: Diagnosis not present

## 2018-05-18 DIAGNOSIS — R1011 Right upper quadrant pain: Secondary | ICD-10-CM | POA: Diagnosis not present

## 2018-05-18 MED ORDER — HYDROCHLOROTHIAZIDE 12.5 MG PO TABS: 12.5000 mg | ORAL_TABLET | Freq: Every day | ORAL | 3 refills | Status: DC

## 2018-05-18 NOTE — Progress Notes (Signed)
[facility] [place], [place] [address]  Internal MEDICINE  Office Visit Note  Patient Name: [name]  [id number]  [id number]  Date of Service: 05/23/2018    Pt is here for recent hospital follow up.    Chief Complaint  Patient presents with  . Hypertension  . Hospitalization Follow-up  . Quality Metric Gaps    eye exam     The patient is here for hospital follow up. Was admitted through the ER right after christmas. Had weakness and shortness of breath. Some mild chest discomfort was present. He had very low heart beat. Had to have pacemaker placed. Is now seeing Dr. Clayborn Bigness, cardiology. Blood pressure and heart rate are stable. He admits to having severe GERD since he got his pacemaker. Having pain in upper abdomen which radiates to the shoulder blades. Feels like something is stabbing him between the shoulder blades. Was seen in the ER for this 05/08/2018 due to these symptoms. Had to have two GI cocktails before he could go home. This is happening after every time he eats. Losing his appetite and does not want to eat. A CT chest was done while in the ER and he had no evidence of PE and nothing acute was found.     Current Medication: Outpatient Encounter Medications as of 05/18/2018  Medication Sig  . allopurinol (ZYLOPRIM) 300 MG tablet TAKE 1 TABLET BY MOUTH AT BEDTIME FOR GOUT (Patient taking differently: Take 300 mg by mouth at bedtime. )  . amLODipine (NORVASC) 10 MG tablet Take 1 tab po daily (Patient taking differently: Take 10 mg by mouth daily. )  . colchicine 0.6 MG tablet 1 TAB TWICE DAILY X 3 DAYS FOR GOUT FLARE UP. OTHERWISE ONCE DAILY FOR PREVENTION (Patient taking differently: Take 0.6 mg by mouth See admin instructions. Take 1 tablet (0.6MG ) by mouth daily and 1 tablet (0.6MG ) by mouth twice daily for three days as needed for acute gout flare)  . esomeprazole (NEXIUM) 20 MG packet Take 20 mg by mouth 2 (two) times daily.  Marland Kitchen  losartan-hydrochlorothiazide (HYZAAR) 100-12.5 MG tablet TAKE 1 TABLET BY MOUTH EVERY MORNING FOR BREAKFAST (Patient taking differently: Take 1 tablet by mouth daily. )  . sucralfate (CARAFATE) 1 g tablet Take 1 tablet (1 g total) by mouth 4 (four) times daily.  . tamsulosin (FLOMAX) 0.4 MG CAPS capsule TAKE 1 CAPSULE (0.4 MG TOTAL) BY MOUTH DAILY. (Patient taking differently: Take 0.4 mg by mouth daily. )  . hydrochlorothiazide (HYDRODIURIL) 12.5 MG tablet Take 1 tablet (12.5 mg total) by mouth daily.  Marland Kitchen lidocaine (XYLOCAINE) 2 % solution Use as directed 15 mLs in the mouth or throat as needed for mouth pain. (Patient not taking: Reported on 05/18/2018)  . sildenafil (REVATIO) 20 MG tablet 1-3 tablets 1 hour prior to intercourse (Patient not taking: Reported on 05/18/2018)  . [DISCONTINUED] cephALEXin (KEFLEX) 500 MG capsule Take 1 capsule (500 mg total) by mouth every 12 (twelve) hours. (Patient not taking: Reported on 05/18/2018)  . [DISCONTINUED] furosemide (LASIX) 20 MG tablet Take 20 mg by mouth daily.    No facility-administered encounter medications on file as of 05/18/2018.     Surgical History: Past Surgical History:  Procedure Laterality Date  . CORONARY/GRAFT ACUTE MI REVASCULARIZATION N/A 05/02/2018   Procedure: Coronary/Graft Acute MI Revascularization;  Surgeon: [name], MD;  Location: [place] [facility];  Service: Cardiovascular;  Laterality: N/A;  . HERNIA REPAIR    . LEFT  HEART CATH AND CORONARY ANGIOGRAPHY N/A 05/02/2018   Procedure: LEFT HEART CATH AND CORONARY ANGIOGRAPHY;  Surgeon: [name], MD;  Location: [place] [facility];  Service: Cardiovascular;  Laterality: N/A;  . PACEMAKER INSERTION Left 05/03/2018   Procedure: INSERTION PACEMAKER;  Surgeon: [name], MD;  Location: [facility];  Service: Cardiovascular;  Laterality: Left;  . TEMPORARY PACEMAKER N/A 05/02/2018   Procedure: TEMPORARY PACEMAKER;  Surgeon: [name], MD;   Location: [place] [facility];  Service: Cardiovascular;  Laterality: N/A;  . TEMPORARY PACEMAKER Right 05/03/2018   Procedure: TEMPORARY PACEMAKER REMOVAL;  Surgeon: [name], MD;  Location: [facility];  Service: Cardiovascular;  Laterality: Right;  . TONSILLECTOMY Bilateral    1992    Medical History: Past Medical History:  Diagnosis Date  . Diabetes mellitus without complication ([place])   . GERD (gastroesophageal reflux disease)   . Hyperlipidemia   . Hypertension     Family History: Family History  Problem Relation Age of Onset  . Cirrhosis Father   . Bone cancer Mother     Social History   Socioeconomic History  . Marital status: Married    Spouse name: Not on file  . Number of children: Not on file  . Years of education: Not on file  . Highest education level: Not on file  Occupational History  . Not on file  Social Needs  . Financial resource strain: Not on file  . Food insecurity:    Worry: Not on file    Inability: Not on file  . Transportation needs:    Medical: Not on file    Non-medical: Not on file  Tobacco Use  . Smoking status: Never Smoker  . Smokeless tobacco: Never Used  Substance and Sexual Activity  . Alcohol use: No    Frequency: Never  . Drug use: No  . Sexual activity: Not on file  Lifestyle  . Physical activity:    Days per week: Not on file    Minutes per session: Not on file  . Stress: Not on file  Relationships  . Social connections:    Talks on phone: Not on file    Gets together: Not on file    Attends religious service: Not on file    Active member of club or organization: Not on file    Attends meetings of clubs or organizations: Not on file    Relationship status: Not on file  . Intimate partner violence:    Fear of current or ex partner: Not on file    Emotionally abused: Not on file    Physically abused: Not on file    Forced sexual activity: Not on file  Other Topics Concern  . Not on file  Social History  Narrative  . Not on file      Review of Systems  Constitutional: Positive for activity change and fatigue. Negative for chills and unexpected weight change.       Less active since recent hospitalization.  HENT: Negative for congestion, postnasal drip, rhinorrhea, sneezing and sore throat.   Respiratory: Negative for cough, chest tightness, shortness of breath and wheezing.   Cardiovascular:       Had pacemaker placed due to severe bradycardia.  Gastrointestinal: Negative for abdominal pain, constipation, diarrhea, nausea and vomiting.       Moderate to severe GERD symptoms. Hurts mostly after he eats. He has excess gas. beching makes him feel better.   Genitourinary: Negative for dysuria and frequency.  Musculoskeletal: Negative for arthralgias, back pain, joint swelling and neck pain.  Skin: Negative for rash.  Neurological: Negative.  Negative for tremors and numbness.  Hematological: Negative for adenopathy. Does not bruise/bleed easily.  Psychiatric/Behavioral: Negative for behavioral problems (Depression), sleep disturbance and suicidal ideas. The patient is not nervous/anxious.    Today's Vitals   05/18/18 1057  BP: (!) 151/83  Pulse: 74  Resp: 16  SpO2: 97%  Weight: 250 lb (113.4 kg)  Height: 6' 3.5" (1.918 m)     Physical Exam Vitals signs and nursing note reviewed.  Constitutional:      General: He is not in acute distress.    Appearance: Normal appearance. He is well-developed. He is not diaphoretic.  HENT:     Head: Normocephalic and atraumatic.     Mouth/Throat:     Pharynx: No oropharyngeal exudate.  Eyes:     Pupils: Pupils are equal, round, and reactive to light.  Neck:     Musculoskeletal: Normal range of motion and neck supple.     Thyroid: No thyromegaly.     Vascular: No carotid bruit or JVD.     Trachea: No tracheal deviation.  Cardiovascular:     Rate and Rhythm: Normal rate. Rhythm irregular.     Pulses: Normal pulses.     Heart sounds:  Murmur present. No friction rub. No gallop.   Pulmonary:     Effort: Pulmonary effort is normal. No respiratory distress.     Breath sounds: Normal breath sounds. No wheezing or rales.  Chest:     Chest wall: No tenderness.  Abdominal:     General: Bowel sounds are normal.     Palpations: Abdomen is soft.     Tenderness: There is abdominal tenderness.  Musculoskeletal: Normal range of motion.  Lymphadenopathy:     Cervical: No cervical adenopathy.  Skin:    General: Skin is warm and dry.  Neurological:     Mental Status: He is alert and oriented to person, place, and time. Mental status is at baseline.     Cranial Nerves: No cranial nerve deficit.  Psychiatric:        Behavior: Behavior normal.        Thought Content: Thought content normal.        Judgment: Judgment normal.   Assessment/Plan: 1. Coronary artery disease involving native heart, angina presence unspecified, unspecified vessel or lesion type Reviewed patients progress notes, labs, and imaging studies from recent hospitalization. Had heart catheterization and placement of pacemaker.   2. Essential hypertension Stable. Continue bp medication as prescribed  - hydrochlorothiazide (HYDRODIURIL) 12.5 MG tablet; Take 1 tablet (12.5 mg total) by mouth daily.  Dispense: 90 tablet; Refill: 3  3. Right upper quadrant abdominal pain Will get ultrasound of right upper quadrant of abdomen for further evaluation.  - US Abdomen Limited RUQ; Future  4. Gastroesophageal reflux disease without esophagitis Continue nexium as prescribed .  General Counseling: delmar arriaga understanding of the findings of todays visit and agrees with plan of treatment. I have discussed any further diagnostic evaluation that may be needed or ordered today. We also reviewed his medications today. he has been encouraged to call the office with any questions or concerns that should arise related to todays visit.    Counseling:  Cardiac risk factor  modification:  1. Control blood pressure. 2. Exercise as prescribed. 3. Follow low sodium, low fat diet. and low fat and low cholestrol diet. 4. Take ASA 81mg  once a day.  5. Restricted calories diet to lose weight.  This patient was seen by Leretha Pol FNP Collaboration with Dr Lavera Guise as a part of collaborative care agreement  Orders Placed This Encounter  Procedures  . US Abdomen Limited RUQ      I have reviewed all medical records from hospital follow up including radiology reports and consults from other physicians. Appropriate follow up diagnostics will be scheduled as needed. Patient/ Family understands the plan of treatment. Time spent 30 minutes.   Dr Lavera Guise, MD Internal Medicine

## 2018-05-23 DIAGNOSIS — I2510 Atherosclerotic heart disease of native coronary artery without angina pectoris: Secondary | ICD-10-CM | POA: Insufficient documentation

## 2018-05-23 DIAGNOSIS — R1011 Right upper quadrant pain: Secondary | ICD-10-CM | POA: Insufficient documentation

## 2018-05-28 ENCOUNTER — Ambulatory Visit: Payer: Medicare Other

## 2018-05-28 DIAGNOSIS — R1011 Right upper quadrant pain: Secondary | ICD-10-CM | POA: Diagnosis not present

## 2018-05-31 ENCOUNTER — Other Ambulatory Visit: Payer: Self-pay | Admitting: Nurse Practitioner

## 2018-05-31 DIAGNOSIS — I10 Essential (primary) hypertension: Secondary | ICD-10-CM

## 2018-05-31 MED ORDER — HYDROCHLOROTHIAZIDE 12.5 MG PO TABS: 12.5000 mg | ORAL_TABLET | Freq: Every day | ORAL | 3 refills | Status: DC

## 2018-05-31 MED ORDER — AMLODIPINE BESYLATE 10 MG PO TABS: ORAL_TABLET | ORAL | 3 refills | Status: DC

## 2018-06-21 ENCOUNTER — Other Ambulatory Visit: Payer: Self-pay

## 2018-06-21 ENCOUNTER — Encounter: Payer: Self-pay | Admitting: Adult Health

## 2018-06-21 ENCOUNTER — Ambulatory Visit (INDEPENDENT_AMBULATORY_CARE_PROVIDER_SITE_OTHER): Payer: Medicare Other | Admitting: Adult Health

## 2018-06-21 ENCOUNTER — Telehealth: Payer: Self-pay | Admitting: Internal Medicine

## 2018-06-21 VITALS — BP 130/78 | HR 76 | Resp 16 | Ht 75.5 in | Wt 258.0 lb

## 2018-06-21 DIAGNOSIS — Z0001 Encounter for general adult medical examination with abnormal findings: Secondary | ICD-10-CM | POA: Diagnosis not present

## 2018-06-21 DIAGNOSIS — E6609 Other obesity due to excess calories: Secondary | ICD-10-CM

## 2018-06-21 DIAGNOSIS — E1165 Type 2 diabetes mellitus with hyperglycemia: Secondary | ICD-10-CM | POA: Diagnosis not present

## 2018-06-21 DIAGNOSIS — M109 Gout, unspecified: Secondary | ICD-10-CM

## 2018-06-21 DIAGNOSIS — Z125 Encounter for screening for malignant neoplasm of prostate: Secondary | ICD-10-CM | POA: Diagnosis not present

## 2018-06-21 DIAGNOSIS — Z1211 Encounter for screening for malignant neoplasm of colon: Secondary | ICD-10-CM

## 2018-06-21 DIAGNOSIS — I10 Essential (primary) hypertension: Secondary | ICD-10-CM

## 2018-06-21 DIAGNOSIS — R300 Dysuria: Secondary | ICD-10-CM | POA: Diagnosis not present

## 2018-06-21 DIAGNOSIS — Z6831 Body mass index (BMI) 31.0-31.9, adult: Secondary | ICD-10-CM

## 2018-06-21 LAB — POCT GLYCOSYLATED HEMOGLOBIN (HGB A1C): Hemoglobin A1C: 7 % — AB (ref 4.0–5.6)

## 2018-06-21 MED ORDER — COLCHICINE 0.6 MG PO TABS: ORAL_TABLET | ORAL | 0 refills | Status: DC

## 2018-06-21 NOTE — Patient Instructions (Signed)

## 2018-06-21 NOTE — Telephone Encounter (Signed)
Faxed [facility] order on 06/21/2018

## 2018-06-21 NOTE — Progress Notes (Signed)
[facility] [place],  [id number]  Internal MEDICINE  Office Visit Note  Patient Name: [name]  [id number]  [id number]  Date of Service: 06/21/2018  Chief Complaint  Patient presents with  . Annual Exam  . Hypertension  . Hyperlipidemia  . Abdominal Pain    not any better , need ultrasound results   . Leg Pain    bilateral leg pain   . Quality Metric Gaps    colonoscopy     HPI Pt is here for routine health maintenance examination.  Patient is a well-appearing 73 year old African-American male.  He has a history of hypertension, hyperlipidemia, bilateral leg pain as well as abdominal pain.  He recently received an abdominal ultrasound for his abdominal pain that was negative for any abnormalities.  His blood pressure is well controlled currently today 130/78.  He denies any issues with his hyperlipidemia however has not labs done recently so lab will be ordered.  Patient is also in need of colonoscopy close his quality metric gaps.  He reports that he did a Cologuard 2 years ago however those results are unavailable to [place] in the Cologuard system.  Will resend the kit to the patient and have him redo it.  He continues to deny tobacco, alcohol, or illicit drug use.  He is requesting a refill on his colchicine this time.  Current Medication: Outpatient Encounter Medications as of 06/21/2018  Medication Sig  . allopurinol (ZYLOPRIM) 300 MG tablet TAKE 1 TABLET BY MOUTH AT BEDTIME FOR GOUT (Patient taking differently: Take 300 mg by mouth at bedtime. )  . amLODipine (NORVASC) 10 MG tablet Take 1 tab po daily  . colchicine 0.6 MG tablet 1 TAB TWICE DAILY X 3 DAYS FOR GOUT FLARE UP. OTHERWISE ONCE DAILY FOR PREVENTION (Patient taking differently: Take 0.6 mg by mouth See admin instructions. Take 1 tablet (0.6MG) by mouth daily and 1 tablet (0.6MG) by mouth twice daily for three days as needed for acute gout flare)  . losartan-hydrochlorothiazide  (HYZAAR) 100-12.5 MG tablet TAKE 1 TABLET BY MOUTH EVERY MORNING FOR BREAKFAST (Patient taking differently: Take 1 tablet by mouth daily. )  . ranitidine (ZANTAC) 300 MG tablet Take 300 mg by mouth at bedtime.  . sucralfate (CARAFATE) 1 g tablet Take 1 tablet (1 g total) by mouth 4 (four) times daily.  . tamsulosin (FLOMAX) 0.4 MG CAPS capsule TAKE 1 CAPSULE (0.4 MG TOTAL) BY MOUTH DAILY. (Patient taking differently: Take 0.4 mg by mouth daily. )  . esomeprazole (NEXIUM) 20 MG packet Take 20 mg by mouth 2 (two) times daily.  . hydrochlorothiazide (HYDRODIURIL) 12.5 MG tablet Take 1 tablet (12.5 mg total) by mouth daily. (Patient not taking: Reported on 06/21/2018)  . [DISCONTINUED] lidocaine (XYLOCAINE) 2 % solution Use as directed 15 mLs in the mouth or throat as needed for mouth pain. (Patient not taking: Reported on 05/18/2018)  . [DISCONTINUED] sildenafil (REVATIO) 20 MG tablet 1-3 tablets 1 hour prior to intercourse (Patient not taking: Reported on 05/18/2018)   No facility-administered encounter medications on file as of 06/21/2018.     Surgical History: Past Surgical History:  Procedure Laterality Date  . CORONARY/GRAFT ACUTE MI REVASCULARIZATION N/A 05/02/2018   Procedure: Coronary/Graft Acute MI Revascularization;  Surgeon: [name], MD;  Location: [place] [facility];  Service: Cardiovascular;  Laterality: N/A;  . HERNIA REPAIR    . LEFT HEART CATH AND CORONARY ANGIOGRAPHY N/A 05/02/2018   Procedure: LEFT HEART CATH  AND CORONARY ANGIOGRAPHY;  Surgeon: [name], MD;  Location: [place] [facility];  Service: Cardiovascular;  Laterality: N/A;  . PACEMAKER INSERTION Left 05/03/2018   Procedure: INSERTION PACEMAKER;  Surgeon: [name], MD;  Location: [facility];  Service: Cardiovascular;  Laterality: Left;  . TEMPORARY PACEMAKER N/A 05/02/2018   Procedure: TEMPORARY PACEMAKER;  Surgeon: [name], MD;  Location: [place] [facility];  Service:  Cardiovascular;  Laterality: N/A;  . TEMPORARY PACEMAKER Right 05/03/2018   Procedure: TEMPORARY PACEMAKER REMOVAL;  Surgeon: [name], MD;  Location: [facility];  Service: Cardiovascular;  Laterality: Right;  . TONSILLECTOMY Bilateral    1992    Medical History: Past Medical History:  Diagnosis Date  . Diabetes mellitus without complication ([place])   . GERD (gastroesophageal reflux disease)   . Hyperlipidemia   . Hypertension     Family History: Family History  Problem Relation Age of Onset  . Cirrhosis Father   . Bone cancer Mother    Depression screen [facility] 2/9 06/21/2018 05/18/2017  Decreased Interest 0 0  Down, Depressed, Hopeless 0 0  PHQ - 2 Score 0 0    Functional Status Survey: Is the patient deaf or have difficulty hearing?: No Does the patient have difficulty seeing, even when wearing glasses/contacts?: No Does the patient have difficulty concentrating, remembering, or making decisions?: No Does the patient have difficulty walking or climbing stairs?: No Does the patient have difficulty dressing or bathing?: No Does the patient have difficulty doing errands alone such as visiting a doctor's office or shopping?: No  MMSE - [place] Exam 06/21/2018  Orientation to time 5  Orientation to Place 5  Registration 3  Recall 3  Language- name 2 objects 2  Language- repeat 1  Language- follow 3 step command 3  Language- read & follow direction 1  Write a sentence 1  Copy design 1    Fall Risk  06/21/2018 05/18/2017  Falls in the past year? 0 No      Review of Systems  Constitutional: Negative.  Negative for chills, fatigue and unexpected weight change.  HENT: Negative.  Negative for congestion, rhinorrhea, sneezing and sore throat.   Eyes: Negative for redness.  Respiratory: Negative.  Negative for cough, chest tightness and shortness of breath.   Cardiovascular: Negative.  Negative for chest pain and palpitations.  Gastrointestinal: Negative.   Negative for abdominal pain, constipation, diarrhea, nausea and vomiting.       GERD pain  Endocrine: Negative.   Genitourinary: Negative.  Negative for dysuria and frequency.  Musculoskeletal: Negative.  Negative for arthralgias, back pain, joint swelling and neck pain.  Skin: Negative.  Negative for rash.  Allergic/Immunologic: Negative.   Neurological: Negative.  Negative for tremors and numbness.  Hematological: Negative for adenopathy. Does not bruise/bleed easily.  Psychiatric/Behavioral: Negative.  Negative for behavioral problems, sleep disturbance and suicidal ideas. The patient is not nervous/anxious.      Vital Signs: BP 130/78 (BP Location: Left Arm, Patient Position: Sitting, Cuff Size: Normal)   Pulse 76   Resp 16   Ht 6' 3.5" (1.918 m)   Wt 258 lb (117 kg)   SpO2 97%   BMI 31.82 kg/m    Physical Exam Vitals signs and nursing note reviewed.  Constitutional:      General: He is not in acute distress.    Appearance: He is well-developed. He is not diaphoretic.  HENT:     Head: Normocephalic and atraumatic.  Mouth/Throat:     Pharynx: No oropharyngeal exudate.  Eyes:     Pupils: Pupils are equal, round, and reactive to light.  Neck:     Musculoskeletal: Normal range of motion and neck supple.     Thyroid: No thyromegaly.     Vascular: No JVD.     Trachea: No tracheal deviation.  Cardiovascular:     Rate and Rhythm: Normal rate and regular rhythm.     Heart sounds: Normal heart sounds. No murmur. No friction rub. No gallop.   Pulmonary:     Effort: Pulmonary effort is normal. No respiratory distress.     Breath sounds: Normal breath sounds. No wheezing or rales.  Chest:     Chest wall: No tenderness.  Abdominal:     Palpations: Abdomen is soft.     Tenderness: There is no abdominal tenderness. There is no guarding.  Musculoskeletal: Normal range of motion.  Lymphadenopathy:     Cervical: No cervical adenopathy.  Skin:    General: Skin is warm and  dry.  Neurological:     Mental Status: He is alert and oriented to person, place, and time.     Cranial Nerves: No cranial nerve deficit.  Psychiatric:        Behavior: Behavior normal.        Thought Content: Thought content normal.        Judgment: Judgment normal.      LABS: Recent Results (from the past 2160 hour(s))  CBC     Status: None   Collection Time: 04/30/18 12:12 PM  Result Value Ref Range   WBC 5.0 4.0 - 10.5 K/uL   RBC 5.13 4.22 - 5.81 MIL/uL   Hemoglobin 13.8 13.0 - 17.0 g/dL   HCT 42.5 39.0 - 52.0 %   MCV 82.8 80.0 - 100.0 fL   MCH 26.9 26.0 - 34.0 pg   MCHC 32.5 30.0 - 36.0 g/dL   RDW 13.9 11.5 - 15.5 %   Platelets 206 150 - 400 K/uL   nRBC 0.0 0.0 - 0.2 %    Comment: Performed at [facility], [address] [place], [place] [address]  Troponin I - Once     Status: Abnormal   Collection Time: 04/30/18 12:12 PM  Result Value Ref Range   Troponin I 0.04 (HH) <0.03 ng/mL    Comment: CRITICAL RESULT CALLED TO, READ BACK BY AND VERIFIED WITH TOM NAGY AT 6045 04/30/18.PMF Performed at [facility], [address] [place], [place] [address]   Comprehensive metabolic panel     Status: Abnormal   Collection Time: 04/30/18 12:12 PM  Result Value Ref Range   Sodium 139 135 - 145 mmol/L   Potassium 3.5 3.5 - 5.1 mmol/L   Chloride 104 98 - 111 mmol/L   CO2 27 22 - 32 mmol/L   Glucose, Bld 148 (H) 70 - 99 mg/dL   BUN 26 (H) 8 - 23 mg/dL   Creatinine, Ser 1.03 0.61 - 1.24 mg/dL   Calcium 9.2 8.9 - 10.3 mg/dL   Total Protein 6.9 6.5 - 8.1 g/dL   Albumin 3.9 3.5 - 5.0 g/dL   AST 19 15 - 41 U/L   ALT 20 0 - 44 U/L   Alkaline Phosphatase 80 38 - 126 U/L   Total Bilirubin 0.7 0.3 - 1.2 mg/dL   GFR calc non Af Amer >60 >60 mL/min   GFR calc Af Amer >60 >60 mL/min   Anion gap 8 5 -  15    Comment: Performed at [facility], [address] [place], [place] [address]  Magnesium     Status: None   Collection Time: 04/30/18 12:12 PM   Result Value Ref Range   Magnesium 1.8 1.7 - 2.4 mg/dL    Comment: Performed at [facility], [address] [place], [place] [address]  Troponin I - Now Then Q6H     Status: Abnormal   Collection Time: 04/30/18  3:25 PM  Result Value Ref Range   Troponin I 0.03 (HH) <0.03 ng/mL    Comment: CRITICAL VALUE NOTED. VALUE IS CONSISTENT WITH PREVIOUSLY REPORTED/CALLED VALUE.PMF Performed at [facility], [address] [place], [place] [address]   Troponin I - Now Then Q6H     Status: Abnormal   Collection Time: 04/30/18  8:39 PM  Result Value Ref Range   Troponin I 0.03 (HH) <0.03 ng/mL    Comment: CRITICAL VALUE NOTED. VALUE IS CONSISTENT WITH PREVIOUSLY REPORTED/CALLED VALUE [facility] Performed at [facility], [address] [place], [place] [address]   Troponin I - Now Then Q6H     Status: Abnormal   Collection Time: 05/01/18  2:53 AM  Result Value Ref Range   Troponin I 0.03 (HH) <0.03 ng/mL    Comment: CRITICAL VALUE NOTED. VALUE IS CONSISTENT WITH PREVIOUSLY REPORTED/CALLED VALUE [facility] Performed at [facility], [address] [place], [place] [address]   Basic metabolic panel     Status: Abnormal   Collection Time: 05/01/18  2:53 AM  Result Value Ref Range   Sodium 136 135 - 145 mmol/L   Potassium 3.6 3.5 - 5.1 mmol/L   Chloride 105 98 - 111 mmol/L   CO2 25 22 - 32 mmol/L   Glucose, Bld 141 (H) 70 - 99 mg/dL   BUN 23 8 - 23 mg/dL   Creatinine, Ser 0.88 0.61 - 1.24 mg/dL   Calcium 8.6 (L) 8.9 - 10.3 mg/dL   GFR calc non Af Amer >60 >60 mL/min   GFR calc Af Amer >60 >60 mL/min   Anion gap 6 5 - 15    Comment: Performed at [facility], [address] [place], [place] [address]  CBC     Status: Abnormal   Collection Time: 05/01/18  2:53 AM  Result Value Ref Range   WBC 4.9 4.0 - 10.5 K/uL   RBC 4.69 4.22 - 5.81 MIL/uL   Hemoglobin 12.6 (L) 13.0 - 17.0 g/dL   HCT 38.6 (L) 39.0 - 52.0 %   MCV 82.3 80.0 - 100.0 fL   MCH  26.9 26.0 - 34.0 pg   MCHC 32.6 30.0 - 36.0 g/dL   RDW 14.2 11.5 - 15.5 %   Platelets 199 150 - 400 K/uL   nRBC 0.0 0.0 - 0.2 %    Comment: Performed at [facility], [address] [place], [place] [address]  TSH     Status: None   Collection Time: 05/01/18  2:53 AM  Result Value Ref Range   TSH 0.509 0.350 - 4.500 uIU/mL    Comment: Performed by a 3rd Generation assay with a functional sensitivity of <=0.01 uIU/mL. Performed at [facility], [address] [place], [place] [address]   Procalcitonin - Baseline     Status: None   Collection Time: 05/01/18  2:34 PM  Result Value Ref Range   Procalcitonin <0.10 ng/mL    Comment:        Interpretation:  PCT (Procalcitonin) <= 0.5 ng/mL: Systemic infection (sepsis) is not likely. Local bacterial infection is possible. (NOTE)       Sepsis PCT Algorithm           Lower Respiratory Tract                                      Infection PCT Algorithm    ----------------------------     ----------------------------         PCT < 0.25 ng/mL                PCT < 0.10 ng/mL         Strongly encourage             Strongly discourage   discontinuation of antibiotics    initiation of antibiotics    ----------------------------     -----------------------------       PCT 0.25 - 0.50 ng/mL            PCT 0.10 - 0.25 ng/mL               OR       >80% decrease in PCT            Discourage initiation of                                            antibiotics      Encourage discontinuation           of antibiotics    ----------------------------     -----------------------------         PCT >= 0.50 ng/mL              PCT 0.26 - 0.50 ng/mL               AND        <80% decrease in PCT             Encourage initiation of                                             antibiotics       Encourage continuation           of antibiotics    ----------------------------     -----------------------------        PCT >= 0.50 ng/mL                   PCT > 0.50 ng/mL               AND         increase in PCT                  Strongly encourage                                      initiation of antibiotics    Strongly encourage escalation           of antibiotics                                     -----------------------------  PCT <= 0.25 ng/mL                                                 OR                                        > 80% decrease in PCT                                     Discontinue / Do not initiate                                             antibiotics Performed at [facility], [address] [place], [place] [address]   Troponin I - ONCE - STAT     Status: Abnormal   Collection Time: 05/02/18 12:35 AM  Result Value Ref Range   Troponin I 0.07 (HH) <0.03 ng/mL    Comment: CRITICAL VALUE NOTED. VALUE IS CONSISTENT WITH PREVIOUSLY REPORTED/CALLED VALUE [facility] Performed at [facility], [address] [place], [place] [address]   Glucose, capillary     Status: Abnormal   Collection Time: 05/02/18  6:18 AM  Result Value Ref Range   Glucose-Capillary 135 (H) 70 - 99 mg/dL  MRSA PCR Screening     Status: None   Collection Time: 05/02/18  6:31 AM  Result Value Ref Range   MRSA by PCR NEGATIVE NEGATIVE    Comment:        The GeneXpert MRSA Assay (FDA approved for NASAL specimens only), is one component of a comprehensive MRSA colonization surveillance program. It is not intended to diagnose MRSA infection nor to guide or monitor treatment for MRSA infections. Performed at [facility], [address] [place], [place] [address]   Renal function panel     Status: Abnormal   Collection Time: 05/02/18  1:04 PM  Result Value Ref Range   Sodium 138 135 - 145 mmol/L   Potassium 3.8 3.5 - 5.1 mmol/L   Chloride 103 98 - 111 mmol/L   CO2 25 22 - 32 mmol/L   Glucose, Bld 137 (H) 70 - 99 mg/dL   BUN 22 8 - 23 mg/dL   Creatinine,  Ser 0.87 0.61 - 1.24 mg/dL   Calcium 9.8 8.9 - 10.3 mg/dL   Phosphorus 3.0 2.5 - 4.6 mg/dL   Albumin 4.0 3.5 - 5.0 g/dL   GFR calc non Af Amer >60 >60 mL/min   GFR calc Af Amer >60 >60 mL/min   Anion gap 10 5 - 15    Comment: Performed at [facility], [address], [place], [place] [address]  Magnesium     Status: None   Collection Time: 05/02/18  1:04 PM  Result Value Ref Range   Magnesium 2.0 1.7 - 2.4 mg/dL    Comment: Performed at [facility], [address] [place], [place] [address]  Glucose, capillary     Status: Abnormal   Collection Time: 05/02/18 11:36 PM  Result Value Ref Range   Glucose-Capillary 167 (H)  70 - 99 mg/dL  ECHOCARDIOGRAM COMPLETE     Status: None   Collection Time: 05/03/18 12:43 PM  Result Value Ref Range   Weight 4,137.6 oz   Height 75 in   BP 164/75 mmHg  Glucose, capillary     Status: Abnormal   Collection Time: 05/03/18  6:30 PM  Result Value Ref Range   Glucose-Capillary 131 (H) 70 - 99 mg/dL  Glucose, capillary     Status: Abnormal   Collection Time: 05/03/18 10:19 PM  Result Value Ref Range   Glucose-Capillary 159 (H) 70 - 99 mg/dL  CBC with Differential/Platelet     Status: None   Collection Time: 05/04/18  4:26 AM  Result Value Ref Range   WBC 8.3 4.0 - 10.5 K/uL   RBC 5.48 4.22 - 5.81 MIL/uL   Hemoglobin 14.6 13.0 - 17.0 g/dL   HCT 44.1 39.0 - 52.0 %   MCV 80.5 80.0 - 100.0 fL   MCH 26.6 26.0 - 34.0 pg   MCHC 33.1 30.0 - 36.0 g/dL   RDW 13.9 11.5 - 15.5 %   Platelets 206 150 - 400 K/uL   nRBC 0.0 0.0 - 0.2 %   Neutrophils Relative % 66 %   Neutro Abs 5.5 1.7 - 7.7 K/uL   Lymphocytes Relative 19 %   Lymphs Abs 1.5 0.7 - 4.0 K/uL   Monocytes Relative 11 %   Monocytes Absolute 0.9 0.1 - 1.0 K/uL   Eosinophils Relative 3 %   Eosinophils Absolute 0.2 0.0 - 0.5 K/uL   Basophils Relative 1 %   Basophils Absolute 0.0 0.0 - 0.1 K/uL   Immature Granulocytes 0 %   Abs Immature Granulocytes 0.02 0.00 - 0.07 K/uL     Comment: Performed at [facility], [address] [place], [place] [address]  Basic metabolic panel     Status: Abnormal   Collection Time: 05/04/18  4:26 AM  Result Value Ref Range   Sodium 135 135 - 145 mmol/L   Potassium 3.8 3.5 - 5.1 mmol/L   Chloride 101 98 - 111 mmol/L   CO2 26 22 - 32 mmol/L   Glucose, Bld 149 (H) 70 - 99 mg/dL   BUN 22 8 - 23 mg/dL   Creatinine, Ser 0.88 0.61 - 1.24 mg/dL   Calcium 9.6 8.9 - 10.3 mg/dL   GFR calc non Af Amer >60 >60 mL/min   GFR calc Af Amer >60 >60 mL/min   Anion gap 8 5 - 15    Comment: Performed at [facility], [address] [place], [place] [address]  Magnesium     Status: None   Collection Time: 05/04/18  4:26 AM  Result Value Ref Range   Magnesium 2.0 1.7 - 2.4 mg/dL    Comment: Performed at [facility], [address] [place], [place] [address]  Glucose, capillary     Status: Abnormal   Collection Time: 05/04/18  7:35 AM  Result Value Ref Range   Glucose-Capillary 141 (H) 70 - 99 mg/dL   Comment 1 Notify RN    Comment 2 Document in Chart   Basic metabolic panel     Status: Abnormal   Collection Time: 05/08/18  6:30 PM  Result Value Ref Range   Sodium 133 (L) 135 - 145 mmol/L   Potassium 3.1 (L) 3.5 - 5.1 mmol/L   Chloride 97 (L) 98 - 111 mmol/L   CO2 27 22 - 32 mmol/L   Glucose, Bld 146 (  H) 70 - 99 mg/dL   BUN 16 8 - 23 mg/dL   Creatinine, Ser 0.97 0.61 - 1.24 mg/dL   Calcium 9.4 8.9 - 10.3 mg/dL   GFR calc non Af Amer >60 >60 mL/min   GFR calc Af Amer >60 >60 mL/min   Anion gap 9 5 - 15    Comment: Performed at [facility], [address] [place], [place] [address]  CBC     Status: Abnormal   Collection Time: 05/08/18  6:30 PM  Result Value Ref Range   WBC 8.9 4.0 - 10.5 K/uL   RBC 4.88 4.22 - 5.81 MIL/uL   Hemoglobin 12.9 (L) 13.0 - 17.0 g/dL   HCT 40.0 39.0 - 52.0 %   MCV 82.0 80.0 - 100.0 fL   MCH 26.4 26.0 - 34.0 pg   MCHC 32.3 30.0 - 36.0 g/dL   RDW 13.4 11.5  - 15.5 %   Platelets 226 150 - 400 K/uL   nRBC 0.0 0.0 - 0.2 %    Comment: Performed at [facility], [address] [place], [place] [address]  Troponin I - ONCE - STAT     Status: Abnormal   Collection Time: 05/08/18  6:30 PM  Result Value Ref Range   Troponin I 0.03 (HH) <0.03 ng/mL    Comment: CRITICAL RESULT CALLED TO, READ BACK BY AND VERIFIED WITH REBECCA LYNN ON 05/08/18 AT 1914 BY JAG Performed at [facility], [address] [place], [place] [address]   Troponin I - ONCE - STAT     Status: Abnormal   Collection Time: 05/08/18  8:53 PM  Result Value Ref Range   Troponin I 0.03 (HH) <0.03 ng/mL    Comment: CRITICAL RESULT CALLED TO, READ BACK BY AND VERIFIED WITH [name] MAIN ON 05/08/18 AT 2137 JAG Performed at [facility], [address] [place],  [address]     Assessment/Plan: 1. Encounter for general adult medical examination with abnormal findings Patient is up-to-date on preventive health maintenance except colonoscopy.  A Cologuard will be ordered at this visit.  We will check patient's labs when results available. - CBC with Differential/Platelet - Lipid Panel With LDL/HDL Ratio - TSH - T4, free  2. Uncontrolled type 2 diabetes mellitus with hyperglycemia (HCC) Patient's A1c today is 7.0.  Encourage patient to continue to modify his diet and avoid carbohydrates when possible. - POCT HgB A1C  3. Essential hypertension Patient's blood pressure currently well controlled.  Continue current therapy.  4. Screen for colon cancer Cologuard ordered at this visit.  Will screen results when they are available.  5. Screening for prostate cancer - PSA  6. Dysuria - UA/M w/rflx Culture, Routine  7. Gout, unspecified cause, unspecified chronicity, unspecified site Colchicine prescription refilled for patient at this time. - colchicine 0.6 MG tablet; 1 TAB TWICE DAILY X 3 DAYS FOR GOUT FLARE UP. OTHERWISE ONCE DAILY FOR PREVENTION   Dispense: 180 tablet; Refill: 0  8. Class 1 obesity due to excess calories with serious comorbidity and body mass index (BMI) of 31.0 to 31.9 in adult Obesity Counseling: Risk Assessment: An assessment of behavioral risk factors was made today and includes lack of exercise sedentary lifestyle, lack of portion control and poor dietary habits.  Risk Modification Advice: She was counseled on portion control guidelines. Restricting daily caloric intake to. . The detrimental long term effects of obesity on her health and ongoing poor compliance was also discussed with the patient.  General Counseling: perseus westall understanding of the findings of todays visit and agrees with plan of treatment. I have discussed any further diagnostic evaluation that may be needed or ordered today. We also reviewed his medications today. he has been encouraged to call the office with any questions or concerns that should arise related to todays visit.   Orders Placed This Encounter  Procedures  . UA/M w/rflx Culture, Routine  . POCT HgB A1C    No orders of the defined types were placed in this encounter.   Time spent: 25 Minutes   This patient was seen by Orson Gear AGNP-C in Collaboration with Dr Lavera Guise as a part of collaborative care agreement    Kendell Bane AGNP-C Internal Medicine

## 2018-06-22 LAB — UA/M W/RFLX CULTURE, ROUTINE
Bilirubin, UA: NEGATIVE
Glucose, UA: NEGATIVE
Ketones, UA: NEGATIVE
Leukocytes, UA: NEGATIVE
Nitrite, UA: NEGATIVE
PROTEIN UA: NEGATIVE
RBC, UA: NEGATIVE
SPEC GRAV UA: 1.014 (ref 1.005–1.030)
Urobilinogen, Ur: 0.2 mg/dL (ref 0.2–1.0)
pH, UA: 6 (ref 5.0–7.5)

## 2018-06-22 LAB — MICROSCOPIC EXAMINATION
Bacteria, UA: NONE SEEN
Casts: NONE SEEN /lpf
Epithelial Cells (non renal): NONE SEEN /hpf (ref 0–10)

## 2018-06-23 DIAGNOSIS — R5381 Other malaise: Secondary | ICD-10-CM | POA: Diagnosis not present

## 2018-06-23 DIAGNOSIS — E0781 Sick-euthyroid syndrome: Secondary | ICD-10-CM | POA: Diagnosis not present

## 2018-06-23 DIAGNOSIS — E756 Lipid storage disorder, unspecified: Secondary | ICD-10-CM | POA: Diagnosis not present

## 2018-06-23 DIAGNOSIS — E538 Deficiency of other specified B group vitamins: Secondary | ICD-10-CM | POA: Diagnosis not present

## 2018-06-23 DIAGNOSIS — Z0001 Encounter for general adult medical examination with abnormal findings: Secondary | ICD-10-CM | POA: Diagnosis not present

## 2018-06-23 DIAGNOSIS — E079 Disorder of thyroid, unspecified: Secondary | ICD-10-CM | POA: Diagnosis not present

## 2018-06-24 LAB — CBC WITH DIFFERENTIAL/PLATELET
Basophils Absolute: 0.1 10*3/uL (ref 0.0–0.2)
Basos: 1 %
EOS (ABSOLUTE): 0.2 10*3/uL (ref 0.0–0.4)
Eos: 5 %
Hematocrit: 40.5 % (ref 37.5–51.0)
Hemoglobin: 13.8 g/dL (ref 13.0–17.7)
IMMATURE GRANS (ABS): 0 10*3/uL (ref 0.0–0.1)
IMMATURE GRANULOCYTES: 0 %
Lymphocytes Absolute: 1.6 10*3/uL (ref 0.7–3.1)
Lymphs: 35 %
MCH: 27.1 pg (ref 26.6–33.0)
MCHC: 34.1 g/dL (ref 31.5–35.7)
MCV: 80 fL (ref 79–97)
Monocytes Absolute: 0.4 10*3/uL (ref 0.1–0.9)
Monocytes: 9 %
NEUTROS PCT: 50 %
Neutrophils Absolute: 2.3 10*3/uL (ref 1.4–7.0)
Platelets: 218 10*3/uL (ref 150–450)
RBC: 5.09 x10E6/uL (ref 4.14–5.80)
RDW: 13.8 % (ref 11.6–15.4)
WBC: 4.6 10*3/uL (ref 3.4–10.8)

## 2018-06-24 LAB — PSA: Prostate Specific Ag, Serum: <0.1 ng/mL (ref 0.0–4.0)

## 2018-06-24 LAB — T4, FREE: Free T4: 1.28 ng/dL (ref 0.82–1.77)

## 2018-06-24 LAB — LIPID PANEL WITH LDL/HDL RATIO
Cholesterol, Total: 243 mg/dL — ABNORMAL HIGH (ref 100–199)
HDL: 65 mg/dL (ref >39)
LDL Calculated: 138 mg/dL — ABNORMAL HIGH (ref 0–99)
LDl/HDL Ratio: 2.1 ratio (ref 0.0–3.6)
TRIGLYCERIDES: 199 mg/dL — AB (ref 0–149)
VLDL Cholesterol Cal: 40 mg/dL (ref 5–40)

## 2018-06-24 LAB — TSH: TSH: 0.661 u[IU]/mL (ref 0.450–4.500)

## 2018-06-25 ENCOUNTER — Other Ambulatory Visit: Payer: Self-pay | Admitting: Adult Health

## 2018-06-25 ENCOUNTER — Telehealth: Payer: Self-pay | Admitting: Adult Health

## 2018-06-25 MED ORDER — SIMVASTATIN 10 MG PO TABS: 10.0000 mg | ORAL_TABLET | Freq: Every day | ORAL | 2 refills | Status: DC

## 2018-06-25 NOTE — Progress Notes (Signed)
Pts Cholesterol elevated.  Sending RX for simvastatin to [facility]

## 2018-06-25 NOTE — Telephone Encounter (Signed)
Patient was called in regards to labs, and medication at the pharmacy

## 2018-06-29 ENCOUNTER — Telehealth: Payer: Self-pay | Admitting: Adult Health

## 2018-06-29 DIAGNOSIS — M109 Gout, unspecified: Secondary | ICD-10-CM

## 2018-06-29 NOTE — Telephone Encounter (Signed)
-----   Message from Kendell Bane, NP sent at 06/28/2018  4:32 PM EST ----- I didn't do a uric acid level.  HE didn't have any complaints?  If he would like it done we can send an order to [facility], or we can wait until his gets labs done in the future.   ----- Message ----- From: Lenon Oms, CMA Sent: 06/25/2018  11:14 AM EST To: Kendell Bane, NP  Patient asked about the uric acid levels but I didn't see an order for this?

## 2018-06-29 NOTE — Telephone Encounter (Signed)
Patient was told if he was to have symptoms to have labs drawn at [place] lab order is sent

## 2018-07-22 ENCOUNTER — Other Ambulatory Visit: Payer: Self-pay | Admitting: Adult Health

## 2018-07-22 MED ORDER — LOSARTAN POTASSIUM-HCTZ 100-12.5 MG PO TABS: ORAL_TABLET | ORAL | 3 refills | Status: DC

## 2018-08-10 DIAGNOSIS — J40 Bronchitis, not specified as acute or chronic: Secondary | ICD-10-CM | POA: Diagnosis not present

## 2018-08-10 DIAGNOSIS — R001 Bradycardia, unspecified: Secondary | ICD-10-CM | POA: Diagnosis not present

## 2018-08-10 DIAGNOSIS — K219 Gastro-esophageal reflux disease without esophagitis: Secondary | ICD-10-CM | POA: Diagnosis not present

## 2018-08-10 DIAGNOSIS — I495 Sick sinus syndrome: Secondary | ICD-10-CM | POA: Diagnosis not present

## 2018-08-25 ENCOUNTER — Encounter: Payer: Self-pay | Admitting: Adult Health

## 2018-09-18 ENCOUNTER — Other Ambulatory Visit: Payer: Self-pay | Admitting: Adult Health

## 2018-09-20 ENCOUNTER — Other Ambulatory Visit
Admission: RE | Admit: 2018-09-20 | Discharge: 2018-09-20 | Disposition: A | Discharge status: Home / Self Care | Payer: Medicare Other | Source: Ambulatory Visit | Attending: Internal Medicine | Admitting: Internal Medicine

## 2018-09-20 DIAGNOSIS — R109 Unspecified abdominal pain: Secondary | ICD-10-CM | POA: Diagnosis not present

## 2018-09-20 DIAGNOSIS — I208 Other forms of angina pectoris: Secondary | ICD-10-CM | POA: Diagnosis not present

## 2018-09-20 DIAGNOSIS — I495 Sick sinus syndrome: Secondary | ICD-10-CM | POA: Diagnosis not present

## 2018-09-20 DIAGNOSIS — K219 Gastro-esophageal reflux disease without esophagitis: Secondary | ICD-10-CM | POA: Diagnosis not present

## 2018-09-20 DIAGNOSIS — R001 Bradycardia, unspecified: Secondary | ICD-10-CM | POA: Diagnosis not present

## 2018-09-20 LAB — BRAIN NATRIURETIC PEPTIDE: B Natriuretic Peptide: 59 pg/mL (ref 0.0–100.0)

## 2018-09-28 ENCOUNTER — Other Ambulatory Visit (HOSPITAL_COMMUNITY): Payer: Self-pay | Admitting: Student

## 2018-09-28 ENCOUNTER — Other Ambulatory Visit: Payer: Self-pay | Admitting: Student

## 2018-09-28 DIAGNOSIS — K30 Functional dyspepsia: Secondary | ICD-10-CM | POA: Diagnosis not present

## 2018-09-28 DIAGNOSIS — R1013 Epigastric pain: Secondary | ICD-10-CM

## 2018-09-30 ENCOUNTER — Other Ambulatory Visit: Payer: Self-pay

## 2018-09-30 ENCOUNTER — Other Ambulatory Visit: Payer: Self-pay | Admitting: Student

## 2018-09-30 ENCOUNTER — Ambulatory Visit
Admission: RE | Admit: 2018-09-30 | Discharge: 2018-09-30 | Disposition: A | Discharge status: Home / Self Care | Payer: Medicare Other | Source: Ambulatory Visit | Attending: Student | Admitting: Student

## 2018-09-30 DIAGNOSIS — K30 Functional dyspepsia: Secondary | ICD-10-CM | POA: Insufficient documentation

## 2018-09-30 DIAGNOSIS — R1013 Epigastric pain: Secondary | ICD-10-CM | POA: Insufficient documentation

## 2018-10-06 ENCOUNTER — Other Ambulatory Visit: Payer: Self-pay | Admitting: Adult Health

## 2018-10-06 ENCOUNTER — Telehealth: Payer: Self-pay

## 2018-10-06 DIAGNOSIS — M109 Gout, unspecified: Secondary | ICD-10-CM

## 2018-10-06 NOTE — Telephone Encounter (Signed)
lmom pt need follow up for hemoglobin A1c check and med refills

## 2018-10-19 ENCOUNTER — Other Ambulatory Visit: Payer: Self-pay

## 2018-10-19 MED ORDER — TAMSULOSIN HCL 0.4 MG PO CAPS: ORAL_CAPSULE | ORAL | 1 refills | Status: DC

## 2018-12-01 ENCOUNTER — Telehealth: Payer: Self-pay | Admitting: Nurse Practitioner

## 2018-12-01 NOTE — Telephone Encounter (Signed)
If I am looking at this right, it was his cardiologist who took him off the fluid pills. I'm not sure why they discontinued this. Has he asked them about restarting fluid pills?

## 2018-12-02 ENCOUNTER — Ambulatory Visit (INDEPENDENT_AMBULATORY_CARE_PROVIDER_SITE_OTHER): Payer: Medicare Other | Admitting: Nurse Practitioner

## 2018-12-02 ENCOUNTER — Other Ambulatory Visit: Payer: Self-pay

## 2018-12-02 ENCOUNTER — Encounter: Payer: Self-pay | Admitting: Nurse Practitioner

## 2018-12-02 VITALS — BP 138/85 | HR 77 | Temp 95.9°F | Resp 16 | Ht 75.0 in | Wt 263.2 lb

## 2018-12-02 DIAGNOSIS — E1165 Type 2 diabetes mellitus with hyperglycemia: Secondary | ICD-10-CM | POA: Diagnosis not present

## 2018-12-02 DIAGNOSIS — L209 Atopic dermatitis, unspecified: Secondary | ICD-10-CM | POA: Diagnosis not present

## 2018-12-02 DIAGNOSIS — I10 Essential (primary) hypertension: Secondary | ICD-10-CM

## 2018-12-02 LAB — GLUCOSE, POCT (MANUAL RESULT ENTRY): POC Glucose: 164 mg/dl — AB (ref 70–99)

## 2018-12-02 MED ORDER — DILTIAZEM HCL ER COATED BEADS 240 MG PO CP24: 240.0000 mg | ORAL_CAPSULE | Freq: Every day | ORAL | 3 refills | Status: DC

## 2018-12-02 MED ORDER — TRIAMCINOLONE ACETONIDE 0.025 % EX CREA: 1.0000 "application " | TOPICAL_CREAM | Freq: Two times a day (BID) | CUTANEOUS | 2 refills | Status: DC

## 2018-12-02 NOTE — Progress Notes (Signed)
[facility] [place], [place] [address]  Internal MEDICINE  Office Visit Note  Patient Name: [name]  [id number]  [id number]  Date of Service: 12/13/2018  Chief Complaint  Patient presents with  . Edema    hands, feet, legs    The patient is here for sick visit. He states that he went to the beach this past weekend. Went out in the water, and when he came back in, he noted that he had been swelling in hands and feet, especially the left foot and ankle. Was concerned that fluid pill had been changed per his cardiologist and this was causing the swelling. Then, after further thought, he realized he had not been taking allopurinol to prevent gout flares. He states that yesterday, he took colchicine dosing to help with acute gout flare. Today, the swelling in his leg is improved and tenderness is better. He is to pick up his allopurinol today and he will restart this. He does report being very itchy all over his skin since his visit to the beach. States that he has had fine, red rash, all over since that trip. Itches like crazy. Together, we reviewed most recent notes from cardiology. He had extra dose of HCTZ 12.5mg  discontinued. 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 Dr. Clayborn Bigness had changed his amlodipine to cardizem as he though this would make spells of relux and heart burn improved. He was also started on an oral potassium supplement every day. The patient's medication list has been updated to reflect all changes made.       Current Medication: Outpatient Encounter Medications as of 12/02/2018  Medication Sig Note  . allopurinol (ZYLOPRIM) 300 MG tablet TAKE 1 TABLET BY MOUTH AT BEDTIME FOR GOUT (Patient taking differently: Take 300 mg by mouth at bedtime. )   . colchicine 0.6 MG tablet TAKE 1 TAB BY MOUTH TWICE DAILY X 3 DAYS FOR GOUT FLARE UP. OTHERWISE ONCE DAILY FOR PREVENTION   . esomeprazole (NEXIUM) 20 MG packet Take 20 mg by mouth  2 (two) times daily.   Marland Kitchen losartan-hydrochlorothiazide (HYZAAR) 100-12.5 MG tablet TAKE 1 TABLET BY MOUTH EVERY MORNING FOR BREAKFAST   . simvastatin (ZOCOR) 10 MG tablet TAKE 1 TABLET BY MOUTH EVERY DAY   . sucralfate (CARAFATE) 1 g tablet Take 1 tablet (1 g total) by mouth 4 (four) times daily. (Patient taking differently: Take 1 g by mouth 2 (two) times daily. )   . tamsulosin (FLOMAX) 0.4 MG CAPS capsule TAKE 1 CAPSULE (0.4 MG TOTAL) BY MOUTH DAILY.   . [DISCONTINUED] amLODipine (NORVASC) 10 MG tablet Take 1 tab po daily 12/02/2018: changed per Dr. Clayborn Bigness to diltiazem.  . [DISCONTINUED] hydrochlorothiazide (HYDRODIURIL) 12.5 MG tablet Take 1 tablet (12.5 mg total) by mouth daily. 12/02/2018: per Dr. Clayborn Bigness   . diltiazem (CARTIA XT) 240 MG 24 hr capsule Take 1 capsule (240 mg total) by mouth daily.   Marland Kitchen triamcinolone (KENALOG) 0.025 % cream Apply 1 application topically 2 (two) times daily.   . [DISCONTINUED] ranitidine (ZANTAC) 300 MG tablet Take 300 mg by mouth at bedtime.    No facility-administered encounter medications on file as of 12/02/2018.     Surgical History: Past Surgical History:  Procedure Laterality Date  . CORONARY/GRAFT ACUTE MI REVASCULARIZATION N/A 05/02/2018   Procedure: Coronary/Graft Acute MI Revascularization;  Surgeon: [name], MD;  Location: [place] [facility];  Service: Cardiovascular;  Laterality: N/A;  . HERNIA REPAIR    .  LEFT HEART CATH AND CORONARY ANGIOGRAPHY N/A 05/02/2018   Procedure: LEFT HEART CATH AND CORONARY ANGIOGRAPHY;  Surgeon: [name], MD;  Location: [place] [facility];  Service: Cardiovascular;  Laterality: N/A;  . PACEMAKER INSERTION Left 05/03/2018   Procedure: INSERTION PACEMAKER;  Surgeon: [name], MD;  Location: [facility];  Service: Cardiovascular;  Laterality: Left;  . TEMPORARY PACEMAKER N/A 05/02/2018   Procedure: TEMPORARY PACEMAKER;  Surgeon: [name], MD;  Location: [place] [facility];   Service: Cardiovascular;  Laterality: N/A;  . TEMPORARY PACEMAKER Right 05/03/2018   Procedure: TEMPORARY PACEMAKER REMOVAL;  Surgeon: [name], MD;  Location: [facility];  Service: Cardiovascular;  Laterality: Right;  . TONSILLECTOMY Bilateral    1992    Medical History: Past Medical History:  Diagnosis Date  . Diabetes mellitus without complication ([place])   . GERD (gastroesophageal reflux disease)   . Hyperlipidemia   . Hypertension     Family History: Family History  Problem Relation Age of Onset  . Cirrhosis Father   . Bone cancer Mother     Social History   Socioeconomic History  . Marital status: Married    Spouse name: Not on file  . Number of children: Not on file  . Years of education: Not on file  . Highest education level: Not on file  Occupational History  . Not on file  Social Needs  . Financial resource strain: Not on file  . Food insecurity    Worry: Not on file    Inability: Not on file  . Transportation needs    Medical: Not on file    Non-medical: Not on file  Tobacco Use  . Smoking status: Former Research scientist (life sciences)  . Smokeless tobacco: Never Used  Substance and Sexual Activity  . Alcohol use: No    Frequency: Never  . Drug use: No  . Sexual activity: Not on file  Lifestyle  . Physical activity    Days per week: Not on file    Minutes per session: Not on file  . Stress: Not on file  Relationships  . Social Herbalist on phone: Not on file    Gets together: Not on file    Attends religious service: Not on file    Active member of club or organization: Not on file    Attends meetings of clubs or organizations: Not on file    Relationship status: Not on file  . Intimate partner violence    Fear of current or ex partner: Not on file    Emotionally abused: Not on file    Physically abused: Not on file    Forced sexual activity: Not on file  Other Topics Concern  . Not on file  Social History Narrative  . Not on file       Review of Systems  Constitutional: Negative for chills, fatigue and unexpected weight change.  HENT: Negative for congestion, postnasal drip, rhinorrhea, sneezing and sore throat.   Eyes: Negative for redness.  Respiratory: Negative for cough, chest tightness and shortness of breath.   Cardiovascular: Positive for leg swelling. Negative for chest pain and palpitations.  Gastrointestinal: Negative for abdominal pain, constipation, diarrhea, nausea and vomiting.  Endocrine: Negative for cold intolerance, heat intolerance, polydipsia and polyuria.  Musculoskeletal: Positive for arthralgias and myalgias. Negative for back pain, joint swelling and neck pain.       Swelling and tenderness of the joints in the hands and left lower legs. Has  been improving over past few days.   Skin: Positive for rash.       Fine, itchy, red rash on the hands, arms, legs, and torso.   Allergic/Immunologic: Positive for environmental allergies.  Neurological: Negative for dizziness, tremors, numbness and headaches.  Hematological: Negative for adenopathy. Does not bruise/bleed easily.  Psychiatric/Behavioral: Negative for behavioral problems (Depression), sleep disturbance and suicidal ideas. The patient is not nervous/anxious.     Today's Vitals   12/02/18 1002  BP: 138/85  Pulse: 77  Resp: 16  Temp: (!) 95.9 F (35.5 C)  SpO2: 98%  Weight: 263 lb 3.2 oz (119.4 kg)  Height: 6\' 3"  (1.905 m)   Body mass index is 32.9 kg/m.  Physical Exam Vitals signs and nursing note reviewed.  Constitutional:      General: He is not in acute distress.    Appearance: Normal appearance. He is well-developed. He is not diaphoretic.  HENT:     Head: Normocephalic and atraumatic.     Mouth/Throat:     Pharynx: No oropharyngeal exudate.  Eyes:     Pupils: Pupils are equal, round, and reactive to light.  Neck:     Musculoskeletal: Normal range of motion and neck supple.     Thyroid: No thyromegaly.      Vascular: No JVD.     Trachea: No tracheal deviation.  Cardiovascular:     Rate and Rhythm: Normal rate and regular rhythm.     Heart sounds: Normal heart sounds. No murmur. No friction rub. No gallop.   Pulmonary:     Effort: Pulmonary effort is normal. No respiratory distress.     Breath sounds: Normal breath sounds. No wheezing or rales.  Chest:     Chest wall: No tenderness.  Abdominal:     Palpations: Abdomen is soft.     Tenderness: There is no abdominal tenderness. There is no guarding.  Musculoskeletal: Normal range of motion.  Lymphadenopathy:     Cervical: No cervical adenopathy.  Skin:    General: Skin is warm and dry.     Comments: Fine, itchy, red rash on the hands, arms, legs, and torso.   Neurological:     Mental Status: He is alert and oriented to person, place, and time.     Cranial Nerves: No cranial nerve deficit.  Psychiatric:        Behavior: Behavior normal.        Thought Content: Thought content normal.        Judgment: Judgment normal.   Assessment/Plan: 1. Type 2 diabetes mellitus with hyperglycemia, unspecified whether long term insulin use (HCC) - POCT Glucose (CBG) - nonfasting 164 today. Continue diabetic medication as prescribed. Check HgbA1c at next, routine visit.   2. Essential hypertension Stable. Continue diltiazem as prescribed  - diltiazem (CARTIA XT) 240 MG 24 hr capsule; Take 1 capsule (240 mg total) by mouth daily.  Dispense: 30 capsule; Refill: 3  3. Atopic dermatitis, unspecified type Use triamcinolone cream on affected areas twice daily as needed and as prescribed  - triamcinolone (KENALOG) 0.025 % cream; Apply 1 application topically 2 (two) times daily.  Dispense: 80 g; Refill: 2  General Counseling: Cross verbalizes understanding of the findings of todays visit and agrees with plan of treatment. I have discussed any further diagnostic evaluation that may be needed or ordered today. We also reviewed his medications today. he has  been encouraged to call the office with any questions or concerns that should arise related to todays  visit.  Diabetes Counseling:  1. Addition of ACE inh/ ARB'S for nephroprotection. Microalbumin is updated  2. Diabetic foot care, prevention of complications. Podiatry consult 3. Exercise and lose weight.  4. Diabetic eye examination, Diabetic eye exam is updated  5. Monitor blood sugar closlely. nutrition counseling.  6. Sign and symptoms of hypoglycemia including shaking sweating,confusion and headaches.  This patient was seen by Leretha Pol FNP Collaboration with Dr Lavera Guise as a part of collaborative care agreement  Orders Placed This Encounter  Procedures  . POCT Glucose (CBG)    Meds ordered this encounter  Medications  . diltiazem (CARTIA XT) 240 MG 24 hr capsule    Sig: Take 1 capsule (240 mg total) by mouth daily.    Dispense:  30 capsule    Refill:  3    Per Dr. Clayborn Bigness    Order Specific Question:   Supervising Provider    Answer:   Lavera Guise [[id number]]  . triamcinolone (KENALOG) 0.025 % cream    Sig: Apply 1 application topically 2 (two) times daily.    Dispense:  80 g    Refill:  2    Order Specific Question:   Supervising Provider    Answer:   Lavera Guise [[id number]]    Time spent: 31  Minutes      Dr Lavera Guise Internal medicine

## 2018-12-13 DIAGNOSIS — L209 Atopic dermatitis, unspecified: Secondary | ICD-10-CM | POA: Insufficient documentation

## 2018-12-13 DIAGNOSIS — E1165 Type 2 diabetes mellitus with hyperglycemia: Secondary | ICD-10-CM | POA: Insufficient documentation

## 2018-12-14 DIAGNOSIS — I495 Sick sinus syndrome: Secondary | ICD-10-CM | POA: Diagnosis not present

## 2018-12-27 ENCOUNTER — Other Ambulatory Visit: Payer: Self-pay

## 2018-12-27 NOTE — Patient Outreach (Signed)
[facility]) Care Management  12/27/2018  [name] 1945-10-15 [id number]   Medication Adherence call to Mr. [name] patient did not want to engage patient hung up. [name] is showing past due on Losartan/Hctc 100/25 mg under [place].   [facility] Management Direct Dial [phone]  Fax [phone] [email or web address]

## 2019-01-03 ENCOUNTER — Other Ambulatory Visit: Payer: Self-pay | Admitting: Internal Medicine

## 2019-02-28 ENCOUNTER — Other Ambulatory Visit: Payer: Self-pay | Admitting: Nurse Practitioner

## 2019-02-28 MED ORDER — ALLOPURINOL 300 MG PO TABS: ORAL_TABLET | ORAL | 3 refills | Status: DC

## 2019-03-06 ENCOUNTER — Encounter: Payer: Self-pay | Admitting: Emergency Medicine

## 2019-03-06 ENCOUNTER — Other Ambulatory Visit: Payer: Self-pay

## 2019-03-06 ENCOUNTER — Ambulatory Visit (INDEPENDENT_AMBULATORY_CARE_PROVIDER_SITE_OTHER): Payer: Medicare Other

## 2019-03-06 ENCOUNTER — Ambulatory Visit
Admission: EM | Admit: 2019-03-06 | Discharge: 2019-03-06 | Disposition: A | Discharge status: Home / Self Care | Payer: Medicare Other | Attending: Family Medicine | Admitting: Family Medicine

## 2019-03-06 DIAGNOSIS — R05 Cough: Secondary | ICD-10-CM | POA: Diagnosis not present

## 2019-03-06 DIAGNOSIS — J029 Acute pharyngitis, unspecified: Secondary | ICD-10-CM

## 2019-03-06 DIAGNOSIS — Z20828 Contact with and (suspected) exposure to other viral communicable diseases: Secondary | ICD-10-CM

## 2019-03-06 DIAGNOSIS — R062 Wheezing: Secondary | ICD-10-CM

## 2019-03-06 DIAGNOSIS — Z7189 Other specified counseling: Secondary | ICD-10-CM

## 2019-03-06 DIAGNOSIS — R0602 Shortness of breath: Secondary | ICD-10-CM

## 2019-03-06 DIAGNOSIS — Z20822 Contact with and (suspected) exposure to covid-19: Secondary | ICD-10-CM

## 2019-03-06 DIAGNOSIS — R059 Cough, unspecified: Secondary | ICD-10-CM

## 2019-03-06 DIAGNOSIS — R509 Fever, unspecified: Secondary | ICD-10-CM | POA: Diagnosis not present

## 2019-03-06 MED ORDER — AZITHROMYCIN 250 MG PO TABS: ORAL_TABLET | ORAL | 0 refills | Status: DC

## 2019-03-06 MED ORDER — BENZONATATE 200 MG PO CAPS: 200.0000 mg | ORAL_CAPSULE | Freq: Three times a day (TID) | ORAL | 0 refills | Status: DC | PRN

## 2019-03-06 MED ORDER — ALBUTEROL SULFATE HFA 108 (90 BASE) MCG/ACT IN AERS: 1.0000 | INHALATION_SPRAY | RESPIRATORY_TRACT | 0 refills | Status: DC | PRN

## 2019-03-06 NOTE — ED Provider Notes (Addendum)
[place], [place]   Name: [name] DOB: 09-11-1945 MRN: [id number] CSN: [id number] PCP: Ronnell Freshwater, NP  Arrival date and time:  03/06/19 1529  Chief Complaint:  Cough, Shortness of Breath, and Fever   NOTE: Prior to seeing the patient today, I have reviewed the triage nursing documentation and vital signs. Clinical staff has updated patient's PMH/PSHx, current medication list, and drug allergies/intolerances to ensure comprehensive history available to assist in medical decision making.   History:   HPI: Gary Rousselle. is a 73 y.o. male who presents today with complaints of cough, shortness of breath, and fevers that began with acute onset on Monday (02/28/2019). He is unable to report a Tmax; "I have just been real hot and had the chills". Patient reports that his cough have been non-productive. He has been wheezing and having more shortness of breath both at rest and with exertion. In efforts to conservatively manage his symptoms at home, the patient notes that he has used Copywriter, advertising and some other cold and flu medications, which have not helped to improve his symptoms. Over the course of the week, patient has experienced generalized headaches, myalgias, sore throat, and several epistaxis episodes. He states, "my throat and upper chest tastes and feels like I swallowed dirt or sand. It is dry and scratchy when I swallow and cough". Patient denies being in close contact with anyone known to be ill. He has never been tested for SARS-CoV-2 (novel coronavirus) per his report. Patient has a significant PMH that includes T2DM, HTN, CAD, obesity, and prostate cancer  Past Medical History:  Diagnosis Date  . Diabetes mellitus without complication ([place])   . GERD (gastroesophageal reflux disease)   . Hyperlipidemia   . Hypertension     Past Surgical History:  Procedure Laterality Date  . CORONARY/GRAFT ACUTE MI REVASCULARIZATION N/A 05/02/2018   Procedure: Coronary/Graft Acute MI  Revascularization;  Surgeon: [name], MD;  Location: [facility];  Service: Cardiovascular;  Laterality: N/A;  . HERNIA REPAIR    . LEFT HEART CATH AND CORONARY ANGIOGRAPHY N/A 05/02/2018   Procedure: LEFT HEART CATH AND CORONARY ANGIOGRAPHY;  Surgeon: [name], MD;  Location: [facility];  Service: Cardiovascular;  Laterality: N/A;  . PACEMAKER INSERTION Left 05/03/2018   Procedure: INSERTION PACEMAKER;  Surgeon: [name], MD;  Location: [facility];  Service: Cardiovascular;  Laterality: Left;  . TEMPORARY PACEMAKER N/A 05/02/2018   Procedure: TEMPORARY PACEMAKER;  Surgeon: [name], MD;  Location: [place] [facility];  Service: Cardiovascular;  Laterality: N/A;  . TEMPORARY PACEMAKER Right 05/03/2018   Procedure: TEMPORARY PACEMAKER REMOVAL;  Surgeon: [name], MD;  Location: [facility];  Service: Cardiovascular;  Laterality: Right;  . TONSILLECTOMY Bilateral    1992    Family History  Problem Relation Age of Onset  . Cirrhosis Father   . Bone cancer Mother     Social History   Tobacco Use  . Smoking status: Former Research scientist (life sciences)  . Smokeless tobacco: Never Used  Substance Use Topics  . Alcohol use: No    Frequency: Never  . Drug use: No    Patient Active Problem List   Diagnosis Date Noted  . Type 2 diabetes mellitus with hyperglycemia ([place]) 12/13/2018  . Atopic dermatitis 12/13/2018  . Coronary artery disease involving native heart 05/23/2018  . Right upper quadrant abdominal pain 05/23/2018  . Bradycardia 04/30/2018  . Hypertension 05/18/2017  . Gastroesophageal reflux disease without esophagitis  05/18/2017  . Prostate cancer ([place]) 06/26/2016  . Anejaculation 10/18/2012  . Erectile dysfunction following radiation therapy 10/18/2012  . Microscopic hematuria 04/19/2012  . Testicular hypofunction 04/19/2012    Home Medications:    Current Meds  Medication Sig  . allopurinol (ZYLOPRIM) 300 MG tablet TAKE  1 TABLET BY MOUTH AT BEDTIME FOR GOUT  . colchicine 0.6 MG tablet TAKE 1 TAB BY MOUTH TWICE DAILY X 3 DAYS FOR GOUT FLARE UP. OTHERWISE ONCE DAILY FOR PREVENTION  . diltiazem (CARTIA XT) 240 MG 24 hr capsule Take 1 capsule (240 mg total) by mouth daily.  Marland Kitchen esomeprazole (NEXIUM) 20 MG packet Take 20 mg by mouth 2 (two) times daily.  Marland Kitchen losartan-hydrochlorothiazide (HYZAAR) 100-12.5 MG tablet TAKE 1 TABLET BY MOUTH EVERY MORNING FOR BREAKFAST  . simvastatin (ZOCOR) 10 MG tablet TAKE 1 TABLET BY MOUTH EVERY DAY  . tamsulosin (FLOMAX) 0.4 MG CAPS capsule TAKE 1 CAPSULE (0.4 MG TOTAL) BY MOUTH DAILY.    Allergies:   Patient has no known allergies.  Review of Systems (ROS): Review of Systems  Constitutional: Positive for fatigue and fever.  HENT: Positive for nosebleeds, rhinorrhea and sore throat. Negative for congestion, ear pain, postnasal drip, sinus pressure, sinus pain and sneezing.   Eyes: Negative for pain, discharge and redness.  Respiratory: Positive for cough, shortness of breath and wheezing. Negative for chest tightness.   Cardiovascular: Negative for chest pain and palpitations.  Gastrointestinal: Negative for abdominal pain, diarrhea, nausea and vomiting.  Endocrine:       PMH (+) for diabetes  Musculoskeletal: Positive for myalgias. Negative for arthralgias, back pain and neck pain.  Skin: Negative for color change, pallor and rash.  Neurological: Positive for headaches. Negative for dizziness, syncope and weakness.  Hematological: Negative for adenopathy.     Vital Signs: Today's Vitals   03/06/19 1537 03/06/19 1541  BP:  (!) 181/94  Pulse:  69  Resp:  16  Temp:  97.7 F (36.5 C)  TempSrc:  Oral  SpO2:  97%  Weight: 265 lb (120.2 kg)   Height: 6\' 3"  (1.905 m)   PainSc: 8      Physical Exam: Physical Exam  Constitutional: He is oriented to person, place, and time and well-developed, well-nourished, and in no distress.  Non-toxic appearance. No distress.  Acutely  ill appearing; fatigued/listless  HENT:  Head: Normocephalic and atraumatic.  Right Ear: Tympanic membrane normal.  Left Ear: Tympanic membrane normal.  Nose: Mucosal edema and rhinorrhea present. No sinus tenderness. Epistaxis (dried blood noted in BILATERAL nares) is observed.  Mouth/Throat: Uvula is midline and mucous membranes are normal. Posterior oropharyngeal erythema present. No posterior oropharyngeal edema.  Eyes: Pupils are equal, round, and reactive to light. Conjunctivae and EOM are normal.  Neck: Normal range of motion. Neck supple.  Cardiovascular: Normal rate, regular rhythm, normal heart sounds and intact distal pulses. Exam reveals no gallop and no friction rub.  No murmur heard. Pulmonary/Chest: Effort normal. No respiratory distress. He has no decreased breath sounds. He has wheezes (scattered expiratory). He has rhonchi (upper airways; clears somewhat with cough). He has no rales. He exhibits no tenderness.  Deep dry cough  Abdominal: Soft. Normal appearance and bowel sounds are normal. He exhibits no distension. There is no abdominal tenderness.  Musculoskeletal: Normal range of motion.  Neurological: He is alert and oriented to person, place, and time. Gait normal.  Skin: Skin is warm and dry. No rash noted. He is not diaphoretic.  Psychiatric: Mood,  memory, affect and judgment normal.  Nursing note and vitals reviewed.   Urgent Care Treatments / Results:   LABS: PLEASE NOTE: all labs that were ordered this encounter are listed, however only abnormal results are displayed. Labs Reviewed  NOVEL CORONAVIRUS, NAA (HOSP ORDER, SEND-OUT TO REF LAB; TAT 18-24 HRS)    EKG: -None  RADIOLOGY: Dg Chest 2 View  Result Date: 03/06/2019 CLINICAL DATA:  Cough and shortness of breath.  Fever. EXAM: CHEST - 2 VIEW COMPARISON:  May 08, 2018 FINDINGS: Stable cardiomegaly and pacemaker. No pneumothorax. No nodules or masses. No focal infiltrates. No acute abnormalities.  IMPRESSION: No active cardiopulmonary disease. Electronically Signed   By: Dorise Bullion III M.D   On: 03/06/2019 16:19    PROCEDURES: Procedures  MEDICATIONS RECEIVED THIS VISIT: Medications - No data to display  PERTINENT CLINICAL COURSE NOTES/UPDATES:   Initial Impression / Assessment and Plan / Urgent Care Course:  Pertinent labs & imaging results that were available during my care of the patient were personally reviewed by me and considered in my medical decision making (see lab/imaging section of note for values and interpretations).  Gary Alar Sr. is a 73 y.o. male who presents to [facility] today with complaints of Cough, Shortness of Breath, and Fever   Patient acutely ill appearing (non-toxic) in clinic today. He appears fatigued and listless. Presenting symptoms (see HPI) and exam as documented above. He presents with symptoms associated with SARS-CoV-2 (novel coronavirus). Discussed typical symptom constellation. Reviewed potential for infection and need for testing. PMH is significant for several co-morbidities (HTN, T2DM, CAD, history of cancer, obesity) that stand increase patient's morbidity and mortality risk if found to be infected with the SARS-CoV-2 virus. Patient amenable to being tested. SARS-CoV-2 swab collected by certified clinical staff. Discussed variable turn around times associated with testing, as swabs are being processed at [facility], and have been taking between 2-5 days to come back. He was advised to self quarantine, per [facility] DHHS guidelines, until negative results received.   Patient visibly SOB and has wheezing in clinic today. He is in no acute distress. Stationary oximetry reveals a room air oxygen saturation of 97%. He has a deep dry sounding cough. (+) subjective fevers at home; afebrile in clinic. Symptoms worsening over the last 5-7 days despite conservative treatment with OTC interventions. Radiographs of the chest revealed no acute  cardiopulmonary process; no evidence of peribronchial thickening, areas of consolidation, or focal infiltrates. Given exam and presenting symptom constellation, will proceed with treatment for URI using a 5 day course of azithromycin. PMH (+) for T2DM and he is unsure of how CBGs have been running; will avoid systemic steroids. Rx sent for SABA inhaler (albuterol) for PRN use to help with SOB and wheezing.  Additionally, will send in Rx for a supply of benzonatate for patient to use on a PRN basis for cough. Discussed supportive care measures at home during acute phase of illness. Patient to rest as much as possible. He was encouraged to ensure adequate hydration (water and ORS) to prevent dehydration and electrolyte derangements. Patient may use APAP and/or IBU on an as needed basis for pain/fever.   Discussed follow up with primary care physician in 1 week for re-evaluation. I have reviewed the follow up and strict return precautions for any new or worsening symptoms. Patient is aware of symptoms that would be deemed urgent/emergent, and would thus require further evaluation either here or in the emergency department. At the time of  discharge, he verbalized understanding and consent with the discharge plan as it was reviewed with him. All questions were fielded by provider and/or clinic staff prior to patient discharge.    Final Clinical Impressions / Urgent Care Diagnoses:   Final diagnoses:  Cough  Encounter for laboratory testing for COVID-19 virus  Advice given about COVID-19 virus infection    New Prescriptions:  [place] Controlled Substance Registry consulted? Not Applicable  Meds ordered this encounter  Medications  . albuterol (VENTOLIN HFA) 108 (90 Base) MCG/ACT inhaler    Sig: Inhale 1-2 puffs into the lungs every 4 (four) hours as needed for wheezing or shortness of breath.    Dispense:  18 g    Refill:  0  . benzonatate (TESSALON) 200 MG capsule    Sig: Take 1 capsule (200 mg total) by  mouth 3 (three) times daily as needed for cough.    Dispense:  15 capsule    Refill:  0  . azithromycin (ZITHROMAX) 250 MG tablet    Sig: 2 tabs (500 mg) x 1 day, then 1 tab (250 mg) x 4 days.    Dispense:  6 tablet    Refill:  0    Recommended Follow up Care:  Patient encouraged to follow up with the following provider within the specified time frame, or sooner as dictated by the severity of his symptoms. As always, he was instructed that for any urgent/emergent care needs, he should seek care either here or in the emergency department for more immediate evaluation.  Follow-up Information    Ronnell Freshwater, NP In 1 week.   Specialty: Family Medicine Why: General reassessment of symptoms if not improving Contact information: [place] [id number] [phone]         NOTE: This note was prepared using Dragon dictation software along with smaller phrase technology. Despite my best ability to proofread, there is the potential that transcriptional errors may still occur from this process, and are completely unintentional.    Karen Kitchens, NP 03/07/19 1920

## 2019-03-06 NOTE — Discharge Instructions (Addendum)
It was very nice seeing you today in clinic. Thank you for entrusting me with your care.   Rest and make sure you are staying hydrated. Try to incorporate electrolyte enriched fluids, such as Gatorade or Pedialyte, into your daily fluid intake.   Use prescriptions. I have sent in antibiotics, cough pills, and an inhaler. They have been sent to your pharmacy.   You were tested for SARS-CoV-2 (novel coronavirus) today. Testing is performed by an outside lab ([facility]) and has variable turn around times ranging between 2-5 days. Current recommendations from the the CDC and [place] DHHS require that you remain out of work in order to quarantine at home until negative test results are have been received. In the event that your test results are positive, you will be contacted with further directives. These measures are being implemented out of an abundance of caution to prevent transmission and spread during the current SARS-CoV-2 pandemic.  Make arrangements to follow up with your regular doctor in 1 week for re-evaluation if not improving. If your symptoms/condition worsens, please seek follow up care either here or in the ER. Please remember, our [place] providers are "right here with you" when you need [place].   Again, it was my pleasure to take care of you today. Thank you for choosing our clinic. I hope that you start to feel better quickly.   Honor Loh, MSN, APRN, FNP-C, CEN Advanced Practice Provider [facility]

## 2019-03-06 NOTE — ED Triage Notes (Signed)
Patient c/o cough, SOB and fever that started on Monday.

## 2019-03-07 ENCOUNTER — Ambulatory Visit: Payer: Medicare Other | Admitting: Nurse Practitioner

## 2019-03-07 LAB — NOVEL CORONAVIRUS, NAA (HOSP ORDER, SEND-OUT TO REF LAB; TAT 18-24 HRS): SARS-CoV-2, NAA: NOT DETECTED

## 2019-03-23 DIAGNOSIS — I208 Other forms of angina pectoris: Secondary | ICD-10-CM | POA: Diagnosis not present

## 2019-03-23 DIAGNOSIS — I495 Sick sinus syndrome: Secondary | ICD-10-CM | POA: Diagnosis not present

## 2019-03-23 DIAGNOSIS — R001 Bradycardia, unspecified: Secondary | ICD-10-CM | POA: Diagnosis not present

## 2019-03-23 DIAGNOSIS — I10 Essential (primary) hypertension: Secondary | ICD-10-CM | POA: Diagnosis not present

## 2019-03-28 ENCOUNTER — Other Ambulatory Visit: Payer: Self-pay | Admitting: Adult Health

## 2019-04-19 DIAGNOSIS — I495 Sick sinus syndrome: Secondary | ICD-10-CM | POA: Diagnosis not present

## 2019-05-11 ENCOUNTER — Telehealth: Payer: Self-pay

## 2019-05-11 NOTE — Telephone Encounter (Signed)
CONFIRMED AND SCREENED FOR 05-12-19 OV. 

## 2019-05-12 ENCOUNTER — Other Ambulatory Visit: Payer: Self-pay

## 2019-05-12 ENCOUNTER — Encounter: Payer: Self-pay | Admitting: Nurse Practitioner

## 2019-05-12 ENCOUNTER — Ambulatory Visit (INDEPENDENT_AMBULATORY_CARE_PROVIDER_SITE_OTHER): Payer: Medicare Other | Admitting: Nurse Practitioner

## 2019-05-12 ENCOUNTER — Other Ambulatory Visit: Payer: Self-pay | Admitting: Nurse Practitioner

## 2019-05-12 VITALS — BP 182/101 | HR 64 | Temp 96.3°F | Resp 16 | Ht 75.0 in | Wt 262.0 lb

## 2019-05-12 DIAGNOSIS — Z1159 Encounter for screening for other viral diseases: Secondary | ICD-10-CM | POA: Diagnosis not present

## 2019-05-12 DIAGNOSIS — D6481 Anemia due to antineoplastic chemotherapy: Secondary | ICD-10-CM | POA: Diagnosis not present

## 2019-05-12 DIAGNOSIS — D509 Iron deficiency anemia, unspecified: Secondary | ICD-10-CM | POA: Diagnosis not present

## 2019-05-12 DIAGNOSIS — E1165 Type 2 diabetes mellitus with hyperglycemia: Secondary | ICD-10-CM | POA: Diagnosis not present

## 2019-05-12 DIAGNOSIS — I10 Essential (primary) hypertension: Secondary | ICD-10-CM

## 2019-05-12 DIAGNOSIS — I2510 Atherosclerotic heart disease of native coronary artery without angina pectoris: Secondary | ICD-10-CM

## 2019-05-12 DIAGNOSIS — R5383 Other fatigue: Secondary | ICD-10-CM

## 2019-05-12 DIAGNOSIS — E611 Iron deficiency: Secondary | ICD-10-CM | POA: Diagnosis not present

## 2019-05-12 DIAGNOSIS — D510 Vitamin B12 deficiency anemia due to intrinsic factor deficiency: Secondary | ICD-10-CM | POA: Diagnosis not present

## 2019-05-12 DIAGNOSIS — E782 Mixed hyperlipidemia: Secondary | ICD-10-CM | POA: Diagnosis not present

## 2019-05-12 MED ORDER — HYDROCHLOROTHIAZIDE 12.5 MG PO TABS: 12.5000 mg | ORAL_TABLET | Freq: Every day | ORAL | 2 refills | Status: DC

## 2019-05-12 NOTE — Progress Notes (Signed)
[facility] [place], [place] [address]  Internal MEDICINE  Office Visit Note  Patient Name: Dartavious Hornor  [id number]  KW:[id number]  Date of Service: 05/14/2019  Chief Complaint  Patient presents with  . Gout    was hard to lift left arm due to gout, takes medication  . Fatigue    gets sleepy during the day, did not happen before, feeling tired    Mr. Butorac presents today with complaints of sleepiness and fatigue for one month. He reports that he usually falls asleep around 7pm, wakes up around 9pm, and 2am, then gets up for the day about 6am. He denies any weakness, dizziness, shortness of breath, or palpitations. He does have a permanent pacemaker, and sees a cardiologist, and states that he has never felt his pacemaker "fire." He has recently started cutting beef out of his diet. He also reports that he had a sleep study about 5 years ago, but a CPAP was not recommended at that time. Mr. Laukaitis also reports a recent gout flare in his Left elbow. He states that it was very painful and he had difficulty lifting his arm, but the pain resolved after taking colchicine for 2 days. He reports that he has had a bulging on his Left elbow for about 3 years.      Current Medication: Outpatient Encounter Medications as of 05/12/2019  Medication Sig  . albuterol (VENTOLIN HFA) 108 (90 Base) MCG/ACT inhaler Inhale 1-2 puffs into the lungs every 4 (four) hours as needed for wheezing or shortness of breath.  . allopurinol (ZYLOPRIM) 300 MG tablet TAKE 1 TABLET BY MOUTH AT BEDTIME FOR GOUT  . benzonatate (TESSALON) 200 MG capsule Take 1 capsule (200 mg total) by mouth 3 (three) times daily as needed for cough.  . colchicine 0.6 MG tablet TAKE 1 TAB BY MOUTH TWICE DAILY X 3 DAYS FOR GOUT FLARE UP. OTHERWISE ONCE DAILY FOR PREVENTION  . diltiazem (CARTIA XT) 240 MG 24 hr capsule Take 1 capsule (240 mg total) by mouth daily.  Marland Kitchen esomeprazole (NEXIUM) 20 MG packet Take 20 mg by  mouth 2 (two) times daily.  . simvastatin (ZOCOR) 10 MG tablet TAKE 1 TABLET BY MOUTH EVERY DAY  . tamsulosin (FLOMAX) 0.4 MG CAPS capsule TAKE 1 CAPSULE (0.4 MG TOTAL) BY MOUTH DAILY.  Marland Kitchen triamcinolone (KENALOG) 0.025 % cream Apply 1 application topically 2 (two) times daily.  . [DISCONTINUED] losartan-hydrochlorothiazide (HYZAAR) 100-12.5 MG tablet TAKE 1 TABLET BY MOUTH EVERY MORNING FOR BREAKFAST  . hydrochlorothiazide (HYDRODIURIL) 12.5 MG tablet Take 1 tablet (12.5 mg total) by mouth daily.  . sucralfate (CARAFATE) 1 g tablet Take 1 tablet (1 g total) by mouth 4 (four) times daily. (Patient taking differently: Take 1 g by mouth 2 (two) times daily. )  . [DISCONTINUED] azithromycin (ZITHROMAX) 250 MG tablet 2 tabs (500 mg) x 1 day, then 1 tab (250 mg) x 4 days. (Patient not taking: Reported on 05/12/2019)   No facility-administered encounter medications on file as of 05/12/2019.    Surgical History: Past Surgical History:  Procedure Laterality Date  . CORONARY/GRAFT ACUTE MI REVASCULARIZATION N/A 05/02/2018   Procedure: Coronary/Graft Acute MI Revascularization;  Surgeon: [name], MD;  Location: [place];  Service: Cardiovascular;  Laterality: N/A;  . HERNIA REPAIR    . LEFT HEART CATH AND CORONARY ANGIOGRAPHY N/A 05/02/2018   Procedure: LEFT HEART CATH AND CORONARY ANGIOGRAPHY;  Surgeon: [name], MD;  Location:  [facility];  Service: Cardiovascular;  Laterality: N/A;  . PACEMAKER INSERTION Left 05/03/2018   Procedure: INSERTION PACEMAKER;  Surgeon: [name], MD;  Location: [facility];  Service: Cardiovascular;  Laterality: Left;  . TEMPORARY PACEMAKER N/A 05/02/2018   Procedure: TEMPORARY PACEMAKER;  Surgeon: [name], MD;  Location: [place];  Service: Cardiovascular;  Laterality: N/A;  . TEMPORARY PACEMAKER Right 05/03/2018   Procedure: TEMPORARY PACEMAKER REMOVAL;  Surgeon: [name], MD;  Location:  [facility];  Service: Cardiovascular;  Laterality: Right;  . TONSILLECTOMY Bilateral    1992    Medical History: Past Medical History:  Diagnosis Date  . Diabetes mellitus without complication ([place])   . GERD (gastroesophageal reflux disease)   . Hyperlipidemia   . Hypertension     Family History: Family History  Problem Relation Age of Onset  . Cirrhosis Father   . Bone cancer Mother     Social History   Socioeconomic History  . Marital status: Married    Spouse name: Not on file  . Number of children: Not on file  . Years of education: Not on file  . Highest education level: Not on file  Occupational History  . Not on file  Tobacco Use  . Smoking status: Former Research scientist (life sciences)  . Smokeless tobacco: Never Used  Substance and Sexual Activity  . Alcohol use: No  . Drug use: No  . Sexual activity: Not on file  Other Topics Concern  . Not on file  Social History Narrative  . Not on file   Social Determinants of Health   Financial Resource Strain:   . Difficulty of Paying Living Expenses: Not on file  Food Insecurity:   . Worried About Charity fundraiser in the Last Year: Not on file  . Ran Out of Food in the Last Year: Not on file  Transportation Needs:   . Lack of Transportation (Medical): Not on file  . Lack of Transportation (Non-Medical): Not on file  Physical Activity:   . Days of Exercise per Week: Not on file  . Minutes of Exercise per Session: Not on file  Stress:   . Feeling of Stress : Not on file  Social Connections:   . Frequency of Communication with Friends and Family: Not on file  . Frequency of Social Gatherings with Friends and Family: Not on file  . Attends Religious Services: Not on file  . Active Member of Clubs or Organizations: Not on file  . Attends Archivist Meetings: Not on file  . Marital Status: Not on file  Intimate Partner Violence:   . Fear of Current or Ex-Partner: Not on file  . Emotionally Abused: Not on file  .  Physically Abused: Not on file  . Sexually Abused: Not on file      Review of Systems  Constitutional: Positive for fatigue. Negative for activity change and appetite change.  HENT: Negative for congestion, postnasal drip, rhinorrhea and sore throat.   Respiratory: Negative for shortness of breath and wheezing.   Cardiovascular: Negative for chest pain and palpitations.       Elevated blood pressure today.   Gastrointestinal: Positive for diarrhea. Negative for constipation, nausea and vomiting.       Diarrhea when taking colchicine routinely  Endocrine: Negative for cold intolerance, heat intolerance, polydipsia and polyuria.  Musculoskeletal: Positive for arthralgias.       Recent gout flare ine left elbow. Improved after taking colchicine for two days.  Still has swelling in the left elbow.   Allergic/Immunologic: Negative for environmental allergies.  Neurological: Negative for dizziness, weakness and headaches.  Hematological: Negative for adenopathy.  Psychiatric/Behavioral: Negative for dysphoric mood. The patient is not nervous/anxious.     Today's Vitals   05/12/19 0910  BP: (!) 182/101  Pulse: 64  Resp: 16  Temp: (!) 96.3 F (35.7 C)  SpO2: 97%  Weight: 262 lb (118.8 kg)  Height: 6\' 3"  (1.905 m)   Body mass index is 32.75 kg/m.  Physical Exam Vitals and nursing note reviewed.  Constitutional:      General: He is not in acute distress.    Appearance: Normal appearance.  HENT:     Head: Normocephalic and atraumatic.     Right Ear: External ear normal. There is impacted cerumen.     Left Ear: Tympanic membrane, ear canal and external ear normal.     Nose: Nose normal.  Eyes:     Pupils: Pupils are equal, round, and reactive to light.  Neck:     Thyroid: No thyroid mass, thyromegaly or thyroid tenderness.     Vascular: No carotid bruit.     Trachea: Trachea normal.  Cardiovascular:     Rate and Rhythm: Normal rate and regular rhythm.     Pulses:           Radial pulses are 2+ on the right side and 2+ on the left side.     Heart sounds: Normal heart sounds.     Comments: 2+ non-pitting edema bilaterally in hands  Pulmonary:     Effort: Pulmonary effort is normal.     Breath sounds: Normal breath sounds.  Musculoskeletal:     Cervical back: Normal range of motion and neck supple. No tenderness.     Comments: There is soft, non tender swelling in the left elbow. Swelling is not red or warm. He has full ROM and strength of the left arm.   Skin:    General: Skin is warm and dry.  Neurological:     Mental Status: He is alert and oriented to person, place, and time.  Psychiatric:        Mood and Affect: Mood normal.        Behavior: Behavior normal.        Thought Content: Thought content normal.        Judgment: Judgment normal.    Assessment/Plan: 1. Other fatigue Labs ordered. Will get overnight pulse oximetry study for further evaluation.  - Pulse oximetry, overnight; Future  2. Essential hypertension Add HCTZ 12.5mg  tablets. Continue other bp medication as prescribed. Limit salt in the diet and increase water intake.  - hydrochlorothiazide (HYDRODIURIL) 12.5 MG tablet; Take 1 tablet (12.5 mg total) by mouth daily.  Dispense: 30 tablet; Refill: 2  3. Coronary artery disease involving native heart, angina presence unspecified, unspecified vessel or lesion type Patient with pacemaker. Will get overnight pulse oximetry study for further evaluation.  - Pulse oximetry, overnight; Future  General Counseling: Pattricia Boss understanding of the findings of todays visit and agrees with plan of treatment. I have discussed any further diagnostic evaluation that may be needed or ordered today. We also reviewed his medications today. he has been encouraged to call the office with any questions or concerns that should arise related to todays visit.  Hypertension Counseling:   The following hypertensive lifestyle modification were recommended  and discussed:  1. Limiting alcohol intake to less than 1 oz/day of ethanol:(24 oz  of beer or 8 oz of wine or 2 oz of 100-proof whiskey). 2. Take baby ASA 81 mg daily. 3. Importance of regular aerobic exercise and losing weight. 4. Reduce dietary saturated fat and cholesterol intake for overall cardiovascular health. 5. Maintaining adequate dietary potassium, calcium, and magnesium intake. 6. Regular monitoring of the blood pressure. 7. Reduce sodium intake to less than 100 mmol/day (less than 2.3 gm of sodium or less than 6 gm of sodium choride)   This patient was seen by [place] with Dr Lavera Guise as a part of collaborative care agreement  Orders Placed This Encounter  Procedures  . Pulse oximetry, overnight    Meds ordered this encounter  Medications  . hydrochlorothiazide (HYDRODIURIL) 12.5 MG tablet    Sig: Take 1 tablet (12.5 mg total) by mouth daily.    Dispense:  30 tablet    Refill:  2    Order Specific Question:   Supervising Provider    Answer:   Lavera Guise [id number]    Total Time spent: 46 Minutes      Dr Lavera Guise Internal medicine

## 2019-05-13 ENCOUNTER — Ambulatory Visit: Payer: Medicare Other | Admitting: Nurse Practitioner

## 2019-05-13 LAB — COMPREHENSIVE METABOLIC PANEL
ALT: 49 IU/L — ABNORMAL HIGH (ref 0–44)
AST: 32 IU/L (ref 0–40)
Albumin/Globulin Ratio: 1.8 (ref 1.2–2.2)
Albumin: 4.4 g/dL (ref 3.7–4.7)
Alkaline Phosphatase: 99 IU/L (ref 39–117)
BUN/Creatinine Ratio: 16 (ref 10–24)
BUN: 18 mg/dL (ref 8–27)
Bilirubin Total: 0.5 mg/dL (ref 0.0–1.2)
CO2: 24 mmol/L (ref 20–29)
Calcium: 10.1 mg/dL (ref 8.6–10.2)
Chloride: 100 mmol/L (ref 96–106)
Creatinine, Ser: 1.12 mg/dL (ref 0.76–1.27)
GFR calc Af Amer: 74 mL/min/{1.73_m2} (ref >59)
GFR calc non Af Amer: 64 mL/min/{1.73_m2} (ref >59)
Globulin, Total: 2.5 g/dL (ref 1.5–4.5)
Glucose: 134 mg/dL — ABNORMAL HIGH (ref 65–99)
Potassium: 4.4 mmol/L (ref 3.5–5.2)
Sodium: 141 mmol/L (ref 134–144)
Total Protein: 6.9 g/dL (ref 6.0–8.5)

## 2019-05-13 LAB — IRON AND TIBC
Iron Saturation: 28 % (ref 15–55)
Iron: 94 ug/dL (ref 38–169)
Total Iron Binding Capacity: 332 ug/dL (ref 250–450)
UIBC: 238 ug/dL (ref 111–343)

## 2019-05-13 LAB — CBC
Hematocrit: 44.5 % (ref 37.5–51.0)
Hemoglobin: 15.1 g/dL (ref 13.0–17.7)
MCH: 26.8 pg (ref 26.6–33.0)
MCHC: 33.9 g/dL (ref 31.5–35.7)
MCV: 79 fL (ref 79–97)
Platelets: 254 10*3/uL (ref 150–450)
RBC: 5.63 x10E6/uL (ref 4.14–5.80)
RDW: 14.6 % (ref 11.6–15.4)
WBC: 5.4 10*3/uL (ref 3.4–10.8)

## 2019-05-13 LAB — HEPATITIS C ANTIBODY (REFLEX): HCV Ab: <0.1 s/co ratio (ref 0.0–0.9)

## 2019-05-13 LAB — LIPID PANEL W/O CHOL/HDL RATIO
Cholesterol, Total: 164 mg/dL (ref 100–199)
HDL: 68 mg/dL (ref >39)
LDL Chol Calc (NIH): 71 mg/dL (ref 0–99)
Triglycerides: 148 mg/dL (ref 0–149)
VLDL Cholesterol Cal: 25 mg/dL (ref 5–40)

## 2019-05-13 LAB — B12 AND FOLATE PANEL
Folate: >20 ng/mL (ref >3.0)
Vitamin B-12: 1060 pg/mL (ref 232–1245)

## 2019-05-13 LAB — FERRITIN: Ferritin: 164 ng/mL (ref 30–400)

## 2019-05-13 LAB — HGB A1C W/O EAG: Hgb A1c MFr Bld: 8 % — ABNORMAL HIGH (ref 4.8–5.6)

## 2019-05-13 LAB — TSH: TSH: 0.634 u[IU]/mL (ref 0.450–4.500)

## 2019-05-13 LAB — T4, FREE: Free T4: 1.38 ng/dL (ref 0.82–1.77)

## 2019-05-13 LAB — PSA: Prostate Specific Ag, Serum: <0.1 ng/mL (ref 0.0–4.0)

## 2019-05-13 LAB — HCV COMMENT:

## 2019-05-14 DIAGNOSIS — R5383 Other fatigue: Secondary | ICD-10-CM | POA: Insufficient documentation

## 2019-05-18 NOTE — Progress Notes (Signed)
Overall, labs are great. Discuss with patient at visit 06/23/2019

## 2019-06-21 ENCOUNTER — Telehealth: Payer: Self-pay

## 2019-06-21 NOTE — Telephone Encounter (Signed)
CONFIRMED 06-23-19 OV AS VIRTUAL.

## 2019-06-23 ENCOUNTER — Other Ambulatory Visit: Payer: Self-pay

## 2019-06-23 ENCOUNTER — Ambulatory Visit (INDEPENDENT_AMBULATORY_CARE_PROVIDER_SITE_OTHER): Payer: Medicare Other | Admitting: Nurse Practitioner

## 2019-06-23 ENCOUNTER — Encounter: Payer: Self-pay | Admitting: Nurse Practitioner

## 2019-06-23 VITALS — BP 138/87 | Ht 75.5 in | Wt 265.0 lb

## 2019-06-23 DIAGNOSIS — E1165 Type 2 diabetes mellitus with hyperglycemia: Secondary | ICD-10-CM | POA: Diagnosis not present

## 2019-06-23 DIAGNOSIS — Z0001 Encounter for general adult medical examination with abnormal findings: Secondary | ICD-10-CM | POA: Diagnosis not present

## 2019-06-23 DIAGNOSIS — R5383 Other fatigue: Secondary | ICD-10-CM | POA: Diagnosis not present

## 2019-06-23 DIAGNOSIS — I10 Essential (primary) hypertension: Secondary | ICD-10-CM

## 2019-06-23 DIAGNOSIS — M109 Gout, unspecified: Secondary | ICD-10-CM | POA: Diagnosis not present

## 2019-06-23 MED ORDER — METFORMIN HCL 500 MG PO TABS: 500.0000 mg | ORAL_TABLET | Freq: Two times a day (BID) | ORAL | 3 refills | Status: DC

## 2019-06-23 NOTE — Progress Notes (Signed)
[facility] [place], [place] [address]  Internal MEDICINE  Telephone Visit  Patient Name: [name]  [id number]  [id number]  Date of Service: 06/23/2019  I connected with the patient at 1014am by webcam and verified the patients identity using two identifiers.   I discussed the limitations, risks, security and privacy concerns of performing an evaluation and management service by webcam and the availability of in person appointments. I also discussed with the patient that there may be a patient responsible charge related to the service.  The patient expressed understanding and agrees to proceed.    Chief Complaint  Patient presents with  . Telephone Screen  . Telephone Assessment  . Hypertension  . Gastroesophageal Reflux  . Sinusitis    The patient has been contacted via webcam for health maintenance visit due to concerns for spread of novel coronavirus. Due to fatigue, he did have routine, fasting labs done prior to this visit. Anemia and thyroid panels were normal. His blood sugar was elevated at 134 with HgbA1c at 8.0 with this. He is diabetic, howevere, his blood sugars have been controlled without use of medication. He would like to stay off medication if possible. He states that he is feeling better. Fatigue has improved considerably. He is due to have screening colonoscopy and eye exam.       Current Medication: Outpatient Encounter Medications as of 06/23/2019  Medication Sig  . albuterol (VENTOLIN HFA) 108 (90 Base) MCG/ACT inhaler Inhale 1-2 puffs into the lungs every 4 (four) hours as needed for wheezing or shortness of breath.  . allopurinol (ZYLOPRIM) 300 MG tablet TAKE 1 TABLET BY MOUTH AT BEDTIME FOR GOUT  . benzonatate (TESSALON) 200 MG capsule Take 1 capsule (200 mg total) by mouth 3 (three) times daily as needed for cough.  . colchicine 0.6 MG tablet TAKE 1 TAB BY MOUTH TWICE DAILY X 3 DAYS FOR GOUT FLARE UP. OTHERWISE ONCE DAILY  FOR PREVENTION  . diltiazem (CARTIA XT) 240 MG 24 hr capsule Take 1 capsule (240 mg total) by mouth daily.  Marland Kitchen esomeprazole (NEXIUM) 20 MG packet Take 20 mg by mouth 2 (two) times daily.  . hydrochlorothiazide (HYDRODIURIL) 12.5 MG tablet Take 1 tablet (12.5 mg total) by mouth daily.  . simvastatin (ZOCOR) 10 MG tablet TAKE 1 TABLET BY MOUTH EVERY DAY  . tamsulosin (FLOMAX) 0.4 MG CAPS capsule TAKE 1 CAPSULE (0.4 MG TOTAL) BY MOUTH DAILY.  Marland Kitchen triamcinolone (KENALOG) 0.025 % cream Apply 1 application topically 2 (two) times daily.  . metFORMIN (GLUCOPHAGE) 500 MG tablet Take 1 tablet (500 mg total) by mouth 2 (two) times daily with a meal.  . sucralfate (CARAFATE) 1 g tablet Take 1 tablet (1 g total) by mouth 4 (four) times daily. (Patient taking differently: Take 1 g by mouth 2 (two) times daily. )  . [DISCONTINUED] losartan-hydrochlorothiazide (HYZAAR) 100-12.5 MG tablet TAKE 1 TABLET BY MOUTH EVERY MORNING FOR BREAKFAST   No facility-administered encounter medications on file as of 06/23/2019.    Surgical History: Past Surgical History:  Procedure Laterality Date  . CORONARY/GRAFT ACUTE MI REVASCULARIZATION N/A 05/02/2018   Procedure: Coronary/Graft Acute MI Revascularization;  Surgeon: [name], MD;  Location: [facility];  Service: Cardiovascular;  Laterality: N/A;  . HERNIA REPAIR    . LEFT HEART CATH AND CORONARY ANGIOGRAPHY N/A 05/02/2018   Procedure: LEFT HEART CATH AND CORONARY ANGIOGRAPHY;  Surgeon: [name], MD;  Location: [facility];  Service: Cardiovascular;  Laterality: N/A;  . PACEMAKER INSERTION Left 05/03/2018   Procedure: INSERTION PACEMAKER;  Surgeon: [name], MD;  Location: [facility];  Service: Cardiovascular;  Laterality: Left;  . TEMPORARY PACEMAKER N/A 05/02/2018   Procedure: TEMPORARY PACEMAKER;  Surgeon: [name], MD;  Location: [facility];  Service: Cardiovascular;  Laterality: N/A;  . TEMPORARY  PACEMAKER Right 05/03/2018   Procedure: TEMPORARY PACEMAKER REMOVAL;  Surgeon: [name], MD;  Location: [facility];  Service: Cardiovascular;  Laterality: Right;  . TONSILLECTOMY Bilateral    1992    Medical History: Past Medical History:  Diagnosis Date  . GERD (gastroesophageal reflux disease)   . Hyperlipidemia   . Hypertension     Family History: Family History  Problem Relation Age of Onset  . Cirrhosis Father   . Bone cancer Mother     Social History   Socioeconomic History  . Marital status: Married    Spouse name: Not on file  . Number of children: Not on file  . Years of education: Not on file  . Highest education level: Not on file  Occupational History  . Not on file  Tobacco Use  . Smoking status: Former Research scientist (life sciences)  . Smokeless tobacco: Never Used  Substance and Sexual Activity  . Alcohol use: No  . Drug use: No  . Sexual activity: Not on file  Other Topics Concern  . Not on file  Social History Narrative  . Not on file   Social Determinants of Health   Financial Resource Strain:   . Difficulty of Paying Living Expenses: Not on file  Food Insecurity:   . Worried About Charity fundraiser in the Last Year: Not on file  . Ran Out of Food in the Last Year: Not on file  Transportation Needs:   . Lack of Transportation (Medical): Not on file  . Lack of Transportation (Non-Medical): Not on file  Physical Activity:   . Days of Exercise per Week: Not on file  . Minutes of Exercise per Session: Not on file  Stress:   . Feeling of Stress : Not on file  Social Connections:   . Frequency of Communication with Friends and Family: Not on file  . Frequency of Social Gatherings with Friends and Family: Not on file  . Attends Religious Services: Not on file  . Active Member of Clubs or Organizations: Not on file  . Attends Archivist Meetings: Not on file  . Marital Status: Not on file  Intimate Partner Violence:   . Fear of Current or  Ex-Partner: Not on file  . Emotionally Abused: Not on file  . Physically Abused: Not on file  . Sexually Abused: Not on file      Review of Systems  Constitutional: Negative for activity change, appetite change and fatigue.       Improved fatigue from last visit .  HENT: Positive for congestion and postnasal drip. Negative for rhinorrhea and sore throat.        Has been taking OTC alka-Seltzer extra strength to help intermittent sinus headaches.  Respiratory: Negative for shortness of breath and wheezing.   Cardiovascular: Negative for chest pain and palpitations.       Blood pressure taken per house calls NP. Was much improved from recent check in the office.   Gastrointestinal: Positive for diarrhea. Negative for constipation, nausea and vomiting.       Diarrhea when taking colchicine routinely  Endocrine: Negative  for cold intolerance, heat intolerance, polydipsia and polyuria.       Elevated blood sugars recently.  Genitourinary: Negative for frequency and urgency.  Musculoskeletal: Positive for arthralgias.       Recent gout flare ine left elbow. Improved after taking colchicine for two days. Still has swelling in the left elbow.   Allergic/Immunologic: Negative for environmental allergies.  Neurological: Negative for dizziness, weakness and headaches.  Hematological: Negative for adenopathy.  Psychiatric/Behavioral: Negative for dysphoric mood. The patient is not nervous/anxious.     Today's Vitals   06/23/19 1002  BP: 138/87  Weight: 265 lb (120.2 kg)  Height: 6' 3.5" (1.918 m)   Body mass index is 32.69 kg/m.  Observation/Objective:  The patient is alert and oriented. He is pleasant and answering all questions appropriately. Breathing is non-labored. He is in no acute distress.    Assessment/Plan: 1. Encounter for general adult medical examination with abnormal findings Annual health maintenance exam today.   2. Type 2 diabetes mellitus with hyperglycemia,  without long-term current use of insulin (HCC) HgbA1c 8.0 with recent labs. Likely cause of increased fatigue. Start metformin 500mg  twice daily. Discussed diet and lifestyle changes to help lower blood sugars. Recheck HgbA1c at next visit.  - metFORMIN (GLUCOPHAGE) 500 MG tablet; Take 1 tablet (500 mg total) by mouth 2 (two) times daily with a meal.  Dispense: 60 tablet; Refill: 3  3. Other fatigue Reviewed labs. Fatigue likely caused from increased blood sugars. HgbA1c 8.0 with recent labs. Likely cause of increased fatigue. Start metformin 500mg  twice daily.  Other labs normal.   4. Essential hypertension Improved. Suggested he try coricidin HBP to treat intermittent post nasal drip and sinus issues. He voiced understanding and agreement.   5. Gout, unspecified cause, unspecified chronicity, unspecified site Continue allopurinol daily. Use colchicine as needed and as prescribed   General Counseling: quashon hubbard understanding of the findings of today's phone visit and agrees with plan of treatment. I have discussed any further diagnostic evaluation that may be needed or ordered today. We also reviewed his medications today. he has been encouraged to call the office with any questions or concerns that should arise related to todays visit.  Diabetes Counseling:  1. Addition of ACE inh/ ARB'S for nephroprotection. Microalbumin is updated  2. Diabetic foot care, prevention of complications. Podiatry consult 3. Exercise and lose weight.  4. Diabetic eye examination, Diabetic eye exam is updated  5. Monitor blood sugar closlely. nutrition counseling.  6. Sign and symptoms of hypoglycemia including shaking sweating,confusion and headaches.  This patient was seen by [place] with Dr Lavera Guise as a part of collaborative care agreement  Meds ordered this encounter  Medications  . metFORMIN (GLUCOPHAGE) 500 MG tablet    Sig: Take 1 tablet (500 mg total) by mouth 2  (two) times daily with a meal.    Dispense:  60 tablet    Refill:  3    Order Specific Question:   Supervising Provider    Answer:   Lavera Guise [id number]    Time spent: 30 Minutes  Time spent with patient included reviewing progress notes, labs, imaging studies, and discussing plan for follow up.   Dr Lavera Guise Internal medicine

## 2019-07-16 ENCOUNTER — Other Ambulatory Visit: Payer: Self-pay | Admitting: Adult Health

## 2019-08-23 DIAGNOSIS — I495 Sick sinus syndrome: Secondary | ICD-10-CM | POA: Diagnosis not present

## 2019-09-19 ENCOUNTER — Other Ambulatory Visit: Payer: Self-pay

## 2019-09-19 DIAGNOSIS — I10 Essential (primary) hypertension: Secondary | ICD-10-CM

## 2019-09-19 MED ORDER — HYDROCHLOROTHIAZIDE 12.5 MG PO TABS: 12.5000 mg | ORAL_TABLET | Freq: Every day | ORAL | 2 refills | Status: DC

## 2019-09-20 ENCOUNTER — Telehealth: Payer: Self-pay

## 2019-09-20 NOTE — Telephone Encounter (Signed)
Lmom to confirm and screen for 09-22-19 ov. 

## 2019-09-22 ENCOUNTER — Ambulatory Visit: Payer: Medicare Other | Admitting: Nurse Practitioner

## 2019-10-25 ENCOUNTER — Other Ambulatory Visit: Payer: Self-pay | Admitting: Adult Health

## 2019-11-01 ENCOUNTER — Telehealth: Payer: Self-pay

## 2019-11-01 NOTE — Telephone Encounter (Signed)
Lmom to confirm and screen for 11-03-19 ov.

## 2019-11-03 ENCOUNTER — Ambulatory Visit (INDEPENDENT_AMBULATORY_CARE_PROVIDER_SITE_OTHER): Payer: Medicare Other | Admitting: Nurse Practitioner

## 2019-11-03 ENCOUNTER — Encounter: Payer: Self-pay | Admitting: Nurse Practitioner

## 2019-11-03 VITALS — BP 138/82 | HR 77 | Temp 97.2°F | Resp 16 | Ht 75.0 in | Wt 239.2 lb

## 2019-11-03 DIAGNOSIS — M109 Gout, unspecified: Secondary | ICD-10-CM

## 2019-11-03 DIAGNOSIS — L723 Sebaceous cyst: Secondary | ICD-10-CM

## 2019-11-03 DIAGNOSIS — I10 Essential (primary) hypertension: Secondary | ICD-10-CM | POA: Diagnosis not present

## 2019-11-03 DIAGNOSIS — Z1211 Encounter for screening for malignant neoplasm of colon: Secondary | ICD-10-CM | POA: Diagnosis not present

## 2019-11-03 DIAGNOSIS — E1165 Type 2 diabetes mellitus with hyperglycemia: Secondary | ICD-10-CM | POA: Diagnosis not present

## 2019-11-03 LAB — POCT GLYCOSYLATED HEMOGLOBIN (HGB A1C): Hemoglobin A1C: 6.6 % — AB (ref 4.0–5.6)

## 2019-11-03 MED ORDER — COLCHICINE 0.6 MG PO TABS: ORAL_TABLET | ORAL | 1 refills | Status: DC

## 2019-11-03 NOTE — Progress Notes (Signed)
[facility] [place], [place] [address]  Internal MEDICINE  Office Visit Note  Patient Name: [name]  [id number]  [id number]  Date of Service: 11/09/2019  Chief Complaint  Patient presents with  . Follow-up    Medication Refill  . Gastroesophageal Reflux  . Hypertension  . Hyperlipidemia  . Diabetes  . Quality Metric Gaps    Eye Exam, Foot Exam  . Ear Pain    L ear pain for about 3 weeks    The patient is here for follow up visit.  -improved blood sugar. HgbA1c 6.6 today. Was 8.0 at his last check. Taking metformin 500mg  twice daily.  - has lost 24 pounds since his last visit. Watching sugar intake. Has stopped drinking soda.   -gout in elbow. Needs refill for colchicine -has had both COVID 19 vaccines -has pain in left ear off and on for several weeks.        Current Medication: Outpatient Encounter Medications as of 11/03/2019  Medication Sig  . albuterol (VENTOLIN HFA) 108 (90 Base) MCG/ACT inhaler Inhale 1-2 puffs into the lungs every 4 (four) hours as needed for wheezing or shortness of breath.  . allopurinol (ZYLOPRIM) 300 MG tablet TAKE 1 TABLET BY MOUTH AT BEDTIME FOR GOUT  . benzonatate (TESSALON) 200 MG capsule Take 1 capsule (200 mg total) by mouth 3 (three) times daily as needed for cough.  . colchicine 0.6 MG tablet TAKE 1 TAB BY MOUTH TWICE DAILY X 3 DAYS FOR GOUT FLARE UP. OTHERWISE ONCE DAILY FOR PREVENTION  . diltiazem (CARTIA XT) 240 MG 24 hr capsule Take 1 capsule (240 mg total) by mouth daily.  Marland Kitchen esomeprazole (NEXIUM) 20 MG packet Take 20 mg by mouth 2 (two) times daily.  . hydrochlorothiazide (HYDRODIURIL) 12.5 MG tablet Take 1 tablet (12.5 mg total) by mouth daily.  Marland Kitchen losartan-hydrochlorothiazide (HYZAAR) 100-12.5 MG tablet TAKE 1 TABLET BY MOUTH EVERY MORNING FOR BREAKFAST  . metFORMIN (GLUCOPHAGE) 500 MG tablet Take 1 tablet (500 mg total) by mouth 2 (two) times daily with a meal.  . simvastatin (ZOCOR) 10 MG tablet  TAKE 1 TABLET BY MOUTH EVERY DAY  . tamsulosin (FLOMAX) 0.4 MG CAPS capsule TAKE 1 CAPSULE (0.4 MG TOTAL) BY MOUTH DAILY.  Marland Kitchen triamcinolone (KENALOG) 0.025 % cream Apply 1 application topically 2 (two) times daily.  . [DISCONTINUED] colchicine 0.6 MG tablet TAKE 1 TAB BY MOUTH TWICE DAILY X 3 DAYS FOR GOUT FLARE UP. OTHERWISE ONCE DAILY FOR PREVENTION  . sucralfate (CARAFATE) 1 g tablet Take 1 tablet (1 g total) by mouth 4 (four) times daily. (Patient taking differently: Take 1 g by mouth 2 (two) times daily. )   No facility-administered encounter medications on file as of 11/03/2019.    Surgical History: Past Surgical History:  Procedure Laterality Date  . CORONARY/GRAFT ACUTE MI REVASCULARIZATION N/A 05/02/2018   Procedure: Coronary/Graft Acute MI Revascularization;  Surgeon: [name], MD;  Location: [place] [facility];  Service: Cardiovascular;  Laterality: N/A;  . HERNIA REPAIR    . LEFT HEART CATH AND CORONARY ANGIOGRAPHY N/A 05/02/2018   Procedure: LEFT HEART CATH AND CORONARY ANGIOGRAPHY;  Surgeon: [name], MD;  Location: [place] [facility];  Service: Cardiovascular;  Laterality: N/A;  . PACEMAKER INSERTION Left 05/03/2018   Procedure: INSERTION PACEMAKER;  Surgeon: [name], MD;  Location: [facility];  Service: Cardiovascular;  Laterality: Left;  . TEMPORARY PACEMAKER N/A 05/02/2018   Procedure: TEMPORARY PACEMAKER;  Surgeon: [name], MD;  Location: [place] [facility];  Service: Cardiovascular;  Laterality: N/A;  . TEMPORARY PACEMAKER Right 05/03/2018   Procedure: TEMPORARY PACEMAKER REMOVAL;  Surgeon: [name], MD;  Location: [facility];  Service: Cardiovascular;  Laterality: Right;  . TONSILLECTOMY Bilateral    1992    Medical History: Past Medical History:  Diagnosis Date  . Diabetes mellitus without complication ([place])   . GERD (gastroesophageal reflux disease)   . Hyperlipidemia   . Hypertension     Family  History: Family History  Problem Relation Age of Onset  . Cirrhosis Father   . Bone cancer Mother     Social History   Socioeconomic History  . Marital status: Married    Spouse name: Not on file  . Number of children: Not on file  . Years of education: Not on file  . Highest education level: Not on file  Occupational History  . Not on file  Tobacco Use  . Smoking status: Former Research scientist (life sciences)  . Smokeless tobacco: Never Used  Vaping Use  . Vaping Use: Never used  Substance and Sexual Activity  . Alcohol use: No  . Drug use: No  . Sexual activity: Not on file  Other Topics Concern  . Not on file  Social History Narrative  . Not on file   Social Determinants of Health   Financial Resource Strain:   . Difficulty of Paying Living Expenses:   Food Insecurity:   . Worried About Charity fundraiser in the Last Year:   . Arboriculturist in the Last Year:   Transportation Needs:   . Film/video editor (Medical):   Marland Kitchen Lack of Transportation (Non-Medical):   Physical Activity:   . Days of Exercise per Week:   . Minutes of Exercise per Session:   Stress:   . Feeling of Stress :   Social Connections:   . Frequency of Communication with Friends and Family:   . Frequency of Social Gatherings with Friends and Family:   . Attends Religious Services:   . Active Member of Clubs or Organizations:   . Attends Archivist Meetings:   Marland Kitchen Marital Status:   Intimate Partner Violence:   . Fear of Current or Ex-Partner:   . Emotionally Abused:   Marland Kitchen Physically Abused:   . Sexually Abused:       Review of Systems  Constitutional: Negative for chills, fatigue and unexpected weight change.  HENT: Negative for congestion, postnasal drip, rhinorrhea, sneezing and sore throat.        Pain outer aspect of right ear. States that this has been intermittent. Past two days has felt much better.   Respiratory: Negative for cough, chest tightness and shortness of breath.    Cardiovascular: Negative for chest pain and palpitations.  Gastrointestinal: Negative for abdominal pain, constipation, diarrhea, nausea and vomiting.  Endocrine: Negative for cold intolerance, heat intolerance, polydipsia and polyuria.       Blood sugars doing well   Musculoskeletal: Negative for arthralgias, back pain, joint swelling and neck pain.  Skin: Negative for rash.  Allergic/Immunologic: Positive for environmental allergies.  Neurological: Negative for dizziness, tremors, numbness and headaches.  Hematological: Negative for adenopathy. Does not bruise/bleed easily.  Psychiatric/Behavioral: Negative for behavioral problems (Depression), sleep disturbance and suicidal ideas. The patient is not nervous/anxious.     Today's Vitals   11/03/19 1045  BP: 138/82  Pulse: 77  Resp: 16  Temp: (!) 97.2 F (36.2  C)  SpO2: 97%  Weight: 239 lb 3.2 oz (108.5 kg)  Height: 6\' 3"  (1.905 m)   Body mass index is 29.9 kg/m.  Physical Exam Vitals and nursing note reviewed.  Constitutional:      General: He is not in acute distress.    Appearance: Normal appearance.  HENT:     Head: Normocephalic and atraumatic.     Right Ear: External ear normal.     Left Ear: Tympanic membrane, ear canal and external ear normal.     Ears:      Nose: Nose normal.  Eyes:     Pupils: Pupils are equal, round, and reactive to light.  Neck:     Thyroid: No thyroid mass, thyromegaly or thyroid tenderness.     Vascular: No carotid bruit.     Trachea: Trachea normal.  Cardiovascular:     Rate and Rhythm: Normal rate and regular rhythm.     Pulses:          Radial pulses are 2+ on the right side and 2+ on the left side.     Heart sounds: Normal heart sounds.     Comments: 2+ non-pitting edema bilaterally in hands  Pulmonary:     Effort: Pulmonary effort is normal.     Breath sounds: Normal breath sounds.  Abdominal:     Palpations: Abdomen is soft.  Musculoskeletal:     Cervical back: Normal  range of motion and neck supple. No tenderness.     Comments: There is soft, non tender swelling in the left elbow. Swelling is not red or warm. He has full ROM and strength of the left arm.   Skin:    General: Skin is warm and dry.  Neurological:     Mental Status: He is alert and oriented to person, place, and time.  Psychiatric:        Mood and Affect: Mood normal.        Behavior: Behavior normal.        Thought Content: Thought content normal.        Judgment: Judgment normal.    Assessment/Plan:  1. Type 2 diabetes mellitus with hyperglycemia, without long-term current use of insulin (HCC) - POCT HgB A1C 6.6 today. Much improved from recent check of 8.0. continue diabetic medication as prescribed.   2. Essential hypertension Stable. Continue bp medication as prescribed   3. Gout, unspecified cause, unspecified chronicity, unspecified site Continue allopurinol daily to prevent acute gout flare. Use colchicine as needed and as prescribed for acute gout flare.  - colchicine 0.6 MG tablet; TAKE 1 TAB BY MOUTH TWICE DAILY X 3 DAYS FOR GOUT FLARE UP. OTHERWISE ONCE DAILY FOR PREVENTION  Dispense: 180 tablet; Refill: 1  4. Screening for colon cancer Refer to GI for colonoscopy.  - Ambulatory referral to Gastroenterology  5. Sebaceous cyst of ear Small, healing sebaceous cyst in auricular fold of right ear. Will monitor.    General Counseling: kamdon reisig understanding of the findings of todays visit and agrees with plan of treatment. I have discussed any further diagnostic evaluation that may be needed or ordered today. We also reviewed his medications today. he has been encouraged to call the office with any questions or concerns that should arise related to todays visit.  Diabetes Counseling:  1. Addition of ACE inh/ ARB'S for nephroprotection. Microalbumin is updated  2. Diabetic foot care, prevention of complications. Podiatry consult 3. Exercise and lose weight.  4.  Diabetic eye examination,  Diabetic eye exam is updated  5. Monitor blood sugar closlely. nutrition counseling.  6. Sign and symptoms of hypoglycemia including shaking sweating,confusion and headaches.  This patient was seen by Leretha Pol FNP Collaboration with Dr Lavera Guise as a part of collaborative care agreement   Orders Placed This Encounter  Procedures  . Ambulatory referral to Gastroenterology  . POCT HgB A1C    Meds ordered this encounter  Medications  . colchicine 0.6 MG tablet    Sig: TAKE 1 TAB BY MOUTH TWICE DAILY X 3 DAYS FOR GOUT FLARE UP. OTHERWISE ONCE DAILY FOR PREVENTION    Dispense:  180 tablet    Refill:  1    Order Specific Question:   Supervising Provider    Answer:   Lavera Guise [[id number]]    Total time spent: 30 Minutes    Time spent includes review of chart, medications, test results, and follow up plan with the patient.      Dr Lavera Guise Internal medicine

## 2019-11-09 DIAGNOSIS — Z1211 Encounter for screening for malignant neoplasm of colon: Secondary | ICD-10-CM | POA: Insufficient documentation

## 2019-11-09 DIAGNOSIS — L723 Sebaceous cyst: Secondary | ICD-10-CM | POA: Insufficient documentation

## 2019-11-14 ENCOUNTER — Telehealth (INDEPENDENT_AMBULATORY_CARE_PROVIDER_SITE_OTHER): Payer: Self-pay | Admitting: Gastroenterology

## 2019-11-14 ENCOUNTER — Telehealth: Payer: Self-pay

## 2019-11-14 DIAGNOSIS — Z1211 Encounter for screening for malignant neoplasm of colon: Secondary | ICD-10-CM

## 2019-11-14 MED ORDER — NA SULFATE-K SULFATE-MG SULF 17.5-3.13-1.6 GM/177ML PO SOLN: 1.0000 | Freq: Once | ORAL | 0 refills | Status: AC

## 2019-11-14 NOTE — Telephone Encounter (Signed)
Received colonoscopy referral for patient.  Contacted him and scheduled his colonoscopy.  Upon scheduling he stated that he had been having problems with his Reflux and had requested a refill and never received it.  Chart was reviewed further after scheduling colonoscopy.  Patient had been seen last year by the GI team at [facility].  Contacted patient and informed him of this, and he said he does remember this.  I asked him if he would like to continue his GI care with [facility] and he said since he has already scheduled his colonoscopy with our office he would like to remain with our office.  [facility] Dept Receptionist Anderson Malta was notified of patients request to transfer GI Care to [facility].   I've asked front office to schedule an appt with Dr. Vicente Males to discuss Reflux.  He agreed to this and was very Patent attorney.

## 2019-11-14 NOTE — Progress Notes (Signed)
Gastroenterology Pre-Procedure Review  Request Date: Monday 11/28/19  Requesting Physician: Dr. Vicente Males  PATIENT REVIEW QUESTIONS: The patient responded to the following health history questions as indicated:    1. Are you having any GI issues? yes (GERD.  Patient states he has seen Dr. Vicente Males for this however I did not see a visit.  I've asked front office to call to schedule him an office visit to discuss.) 2. Do you have a personal history of Polyps? no 3. Do you have a family history of Colon Cancer or Polyps? no 4. Diabetes Mellitus? yes (type 2) 5. Joint replacements in the past 12 months?no 6. Major health problems in the past 3 months?no 7. Any artificial heart valves, MVP, or defibrillator?no    MEDICATIONS & ALLERGIES:    Patient reports the following regarding taking any anticoagulation/antiplatelet therapy:   Plavix, Coumadin, Eliquis, Xarelto, Lovenox, Pradaxa, Brilinta, or Effient? no Aspirin? no  Patient confirms/reports the following medications:  Current Outpatient Medications  Medication Sig Dispense Refill  . allopurinol (ZYLOPRIM) 300 MG tablet TAKE 1 TABLET BY MOUTH AT BEDTIME FOR GOUT 90 tablet 3  . colchicine 0.6 MG tablet TAKE 1 TAB BY MOUTH TWICE DAILY X 3 DAYS FOR GOUT FLARE UP. OTHERWISE ONCE DAILY FOR PREVENTION 180 tablet 1  . diltiazem (CARTIA XT) 240 MG 24 hr capsule Take 1 capsule (240 mg total) by mouth daily. 30 capsule 3  . esomeprazole (NEXIUM) 20 MG packet Take 20 mg by mouth 2 (two) times daily.    Marland Kitchen albuterol (VENTOLIN HFA) 108 (90 Base) MCG/ACT inhaler Inhale 1-2 puffs into the lungs every 4 (four) hours as needed for wheezing or shortness of breath. (Patient not taking: Reported on 11/14/2019) 18 g 0  . benzonatate (TESSALON) 200 MG capsule Take 1 capsule (200 mg total) by mouth 3 (three) times daily as needed for cough. (Patient not taking: Reported on 11/14/2019) 15 capsule 0  . hydrochlorothiazide (HYDRODIURIL) 12.5 MG tablet Take 1 tablet (12.5 mg  total) by mouth daily. 30 tablet 2  . losartan-hydrochlorothiazide (HYZAAR) 100-12.5 MG tablet TAKE 1 TABLET BY MOUTH EVERY MORNING FOR BREAKFAST 90 tablet 0  . metFORMIN (GLUCOPHAGE) 500 MG tablet Take 1 tablet (500 mg total) by mouth 2 (two) times daily with a meal. 60 tablet 3  . Na Sulfate-K Sulfate-Mg Sulf 17.5-3.13-1.6 GM/177ML SOLN Take 1 kit by mouth once for 1 dose. 354 mL 0  . simvastatin (ZOCOR) 10 MG tablet TAKE 1 TABLET BY MOUTH EVERY DAY 90 tablet 1  . sucralfate (CARAFATE) 1 g tablet Take 1 tablet (1 g total) by mouth 4 (four) times daily. (Patient taking differently: Take 1 g by mouth 2 (two) times daily. ) 120 tablet 1  . tamsulosin (FLOMAX) 0.4 MG CAPS capsule TAKE 1 CAPSULE (0.4 MG TOTAL) BY MOUTH DAILY. 90 capsule 1  . triamcinolone (KENALOG) 0.025 % cream Apply 1 application topically 2 (two) times daily. 80 g 2   No current facility-administered medications for this visit.    Patient confirms/reports the following allergies:  No Known Allergies  No orders of the defined types were placed in this encounter.   AUTHORIZATION INFORMATION Primary Insurance: 1D#: Group #:  Secondary Insurance: 1D#: Group #:  SCHEDULE INFORMATION: Date: 11/28/19 Time: Location:[facility]

## 2019-11-14 NOTE — Telephone Encounter (Signed)
Thank you :)

## 2019-11-24 ENCOUNTER — Other Ambulatory Visit: Payer: Self-pay

## 2019-11-24 ENCOUNTER — Other Ambulatory Visit
Admission: RE | Admit: 2019-11-24 | Discharge: 2019-11-24 | Disposition: A | Discharge status: Home / Self Care | Payer: Medicare Other | Source: Ambulatory Visit | Attending: Gastroenterology | Admitting: Gastroenterology

## 2019-11-24 DIAGNOSIS — Z20822 Contact with and (suspected) exposure to covid-19: Secondary | ICD-10-CM | POA: Insufficient documentation

## 2019-11-24 DIAGNOSIS — Z01812 Encounter for preprocedural laboratory examination: Secondary | ICD-10-CM | POA: Diagnosis not present

## 2019-11-24 LAB — SARS CORONAVIRUS 2 (TAT 6-24 HRS): SARS Coronavirus 2: NEGATIVE

## 2019-11-25 ENCOUNTER — Encounter: Payer: Self-pay | Admitting: Gastroenterology

## 2019-11-28 ENCOUNTER — Encounter: Payer: Self-pay | Admitting: Certified Registered Nurse Anesthetist

## 2019-11-28 ENCOUNTER — Encounter: Payer: Self-pay | Admitting: Gastroenterology

## 2019-11-28 ENCOUNTER — Ambulatory Visit
Admission: RE | Admit: 2019-11-28 | Discharge: 2019-11-28 | Disposition: A | Discharge status: Home / Self Care | Payer: Medicare Other | Attending: Gastroenterology | Admitting: Gastroenterology

## 2019-11-28 ENCOUNTER — Other Ambulatory Visit: Payer: Self-pay

## 2019-11-28 ENCOUNTER — Encounter
Admission: RE | Disposition: A | Discharge status: Home / Self Care | Payer: Self-pay | Source: Home / Self Care | Attending: Gastroenterology

## 2019-11-28 DIAGNOSIS — Z1211 Encounter for screening for malignant neoplasm of colon: Secondary | ICD-10-CM | POA: Diagnosis not present

## 2019-11-28 LAB — GLUCOSE, CAPILLARY: Glucose-Capillary: 127 mg/dL — ABNORMAL HIGH (ref 70–99)

## 2019-11-28 SURGERY — COLONOSCOPY WITH PROPOFOL
Anesthesia: General

## 2019-11-28 MED ORDER — SODIUM CHLORIDE 0.9 % IV SOLN: INTRAVENOUS | Status: DC

## 2019-11-28 NOTE — Progress Notes (Addendum)
Patient ate 2 tomato sandwiches at 5 the night of the prep.  Patient not clean .

## 2019-12-01 ENCOUNTER — Other Ambulatory Visit: Payer: Self-pay

## 2019-12-01 MED ORDER — TAMSULOSIN HCL 0.4 MG PO CAPS: ORAL_CAPSULE | ORAL | 1 refills | Status: DC

## 2019-12-04 ENCOUNTER — Other Ambulatory Visit: Payer: Self-pay | Admitting: Internal Medicine

## 2019-12-27 DIAGNOSIS — I495 Sick sinus syndrome: Secondary | ICD-10-CM | POA: Diagnosis not present

## 2019-12-29 ENCOUNTER — Ambulatory Visit: Payer: Medicare Other | Admitting: Gastroenterology

## 2019-12-29 ENCOUNTER — Encounter: Payer: Self-pay | Admitting: Gastroenterology

## 2019-12-29 ENCOUNTER — Other Ambulatory Visit: Payer: Self-pay

## 2019-12-29 ENCOUNTER — Ambulatory Visit (INDEPENDENT_AMBULATORY_CARE_PROVIDER_SITE_OTHER): Payer: Medicare Other | Admitting: Gastroenterology

## 2019-12-29 VITALS — BP 161/90 | HR 69 | Temp 97.6°F | Ht 72.0 in | Wt 238.6 lb

## 2019-12-29 DIAGNOSIS — K219 Gastro-esophageal reflux disease without esophagitis: Secondary | ICD-10-CM

## 2019-12-29 DIAGNOSIS — Z1211 Encounter for screening for malignant neoplasm of colon: Secondary | ICD-10-CM | POA: Diagnosis not present

## 2019-12-29 MED ORDER — NA SULFATE-K SULFATE-MG SULF 17.5-3.13-1.6 GM/177ML PO SOLN
1.0000 | Freq: Once | ORAL | 0 refills | Status: AC
Start: 2019-12-29 — End: 2019-12-29

## 2019-12-29 MED ORDER — ESOMEPRAZOLE MAGNESIUM 40 MG PO CPDR
40.0000 mg | DELAYED_RELEASE_CAPSULE | Freq: Every day | ORAL | 2 refills | Status: DC
Start: 2019-12-29 — End: 2020-03-12

## 2019-12-29 NOTE — Patient Instructions (Signed)

## 2019-12-29 NOTE — Progress Notes (Signed)
[name] MD, MRCP(U.K) [address]  [place]  [place], [place] [address]  Main: [phone]  Fax: [phone]   Gastroenterology Consultation  Referring Provider:     Ronnell Freshwater, NP Primary Care Physician:  Ronnell Freshwater, NP Primary Gastroenterologist:  Dr. [name]  Reason for Consultation:     GERD        HPI:   Gary SEALES Sr. is a 74 y.o. y/o male referred for consultation & management  By  Ronnell Freshwater, NP.    He has previously been seen by her [facility] clinic gastroenterology back in May 2020.  At that point was being evaluated for epigastric pain and indigestion.  Last colonoscopy in 2006 which showed diverticulosis.  Had tried dexlansoprazole, esomeprazole, Carafate, ranitidine, pantoprazole in the past.  He underwent an upper GI series in May 2020 that demonstrated small hiatal hernia and thickened duodenal bulb folds.  Presently has been referred for acid reflux and colon cancer screening.  Was scheduled for a colonoscopy back in July 2021 that was canceled.   He states that he has had heartburn for a few years.  Recently restarted taking Nexium but he takes it after he meals and has been helping on and off.  He does go to bed within a short period of his dinner.  Does not use a wedge pillow.  Intentionally trying to lose some weight recently.  No family history of colon cancer or polyps no GI symptoms. Past Medical History:  Diagnosis Date  . Diabetes mellitus without complication ([place])   . GERD (gastroesophageal reflux disease)   . Hyperlipidemia   . Hypertension     Past Surgical History:  Procedure Laterality Date  . CARDIAC CATHETERIZATION    . CORONARY/GRAFT ACUTE MI REVASCULARIZATION N/A 05/02/2018   Procedure: Coronary/Graft Acute MI Revascularization;  Surgeon: [name], MD;  Location: [place] [facility];  Service: Cardiovascular;  Laterality: N/A;  . HERNIA REPAIR    . LEFT HEART CATH AND CORONARY ANGIOGRAPHY N/A  05/02/2018   Procedure: LEFT HEART CATH AND CORONARY ANGIOGRAPHY;  Surgeon: [name], MD;  Location: [place] [facility];  Service: Cardiovascular;  Laterality: N/A;  . PACEMAKER INSERTION Left 05/03/2018   Procedure: INSERTION PACEMAKER;  Surgeon: [name], MD;  Location: [facility];  Service: Cardiovascular;  Laterality: Left;  . TEMPORARY PACEMAKER N/A 05/02/2018   Procedure: TEMPORARY PACEMAKER;  Surgeon: [name], MD;  Location: [place] [facility];  Service: Cardiovascular;  Laterality: N/A;  . TEMPORARY PACEMAKER Right 05/03/2018   Procedure: TEMPORARY PACEMAKER REMOVAL;  Surgeon: [name], MD;  Location: [facility];  Service: Cardiovascular;  Laterality: Right;  . TONSILLECTOMY Bilateral    1992    Prior to Admission medications   Medication Sig Start Date End Date Taking? Authorizing Provider  albuterol (VENTOLIN HFA) 108 (90 Base) MCG/ACT inhaler Inhale 1-2 puffs into the lungs every 4 (four) hours as needed for wheezing or shortness of breath. Patient not taking: Reported on 11/14/2019 03/06/19   Karen Kitchens, NP  allopurinol (ZYLOPRIM) 300 MG tablet TAKE 1 TABLET BY MOUTH AT BEDTIME FOR GOUT 02/28/19   Ronnell Freshwater, NP  benzonatate (TESSALON) 200 MG capsule Take 1 capsule (200 mg total) by mouth 3 (three) times daily as needed for cough. Patient not taking: Reported on 11/14/2019 03/06/19   Karen Kitchens, NP  colchicine 0.6 MG tablet TAKE 1 TAB BY MOUTH TWICE DAILY X 3 DAYS FOR  GOUT FLARE UP. OTHERWISE ONCE DAILY FOR PREVENTION 11/03/19   Ronnell Freshwater, NP  diltiazem (CARTIA XT) 240 MG 24 hr capsule Take 1 capsule (240 mg total) by mouth daily. 12/02/18   Ronnell Freshwater, NP  esomeprazole (NEXIUM) 20 MG packet Take 20 mg by mouth 2 (two) times daily.    [provider]  hydrochlorothiazide (HYDRODIURIL) 12.5 MG tablet Take 1 tablet (12.5 mg total) by mouth daily. 09/19/19   Ronnell Freshwater, NP  losartan-hydrochlorothiazide  (HYZAAR) 100-12.5 MG tablet TAKE 1 TABLET BY MOUTH EVERY MORNING FOR BREAKFAST 12/05/19   Ronnell Freshwater, NP  metFORMIN (GLUCOPHAGE) 500 MG tablet Take 1 tablet (500 mg total) by mouth 2 (two) times daily with a meal. 06/23/19   Ronnell Freshwater, NP  simvastatin (ZOCOR) 10 MG tablet TAKE 1 TABLET BY MOUTH EVERY DAY 07/17/19   Lavera Guise, MD  sucralfate (CARAFATE) 1 g tablet Take 1 tablet (1 g total) by mouth 4 (four) times daily. Patient taking differently: Take 1 g by mouth 2 (two) times daily.  05/08/18 05/08/19  Earleen Newport, MD  tamsulosin (FLOMAX) 0.4 MG CAPS capsule TAKE 1 CAPSULE (0.4 MG TOTAL) BY MOUTH DAILY. 12/01/19   Ronnell Freshwater, NP  triamcinolone (KENALOG) 0.025 % cream Apply 1 application topically 2 (two) times daily. Patient not taking: Reported on 11/28/2019 12/02/18   Ronnell Freshwater, NP    Family History  Problem Relation Age of Onset  . Cirrhosis Father   . Bone cancer Mother      Social History   Tobacco Use  . Smoking status: Never Smoker  . Smokeless tobacco: Never Used  Vaping Use  . Vaping Use: Never used  Substance Use Topics  . Alcohol use: No  . Drug use: No    Allergies as of 12/29/2019  . (No Known Allergies)    Review of Systems:    All systems reviewed and negative except where noted in HPI.   Physical Exam:  There were no vitals taken for this visit. No LMP for male patient. Psych:  Alert and cooperative. Normal mood and affect. General:   Alert,  Well-developed, well-nourished, pleasant and cooperative in NAD Head:  Normocephalic and atraumatic. Eyes:  Sclera clear, no icterus.   Conjunctiva pink. Ears:  Normal auditory acuity. Lungs:  Respirations even and unlabored.  Clear throughout to auscultation.   No wheezes, crackles, or rhonchi. No acute distress. Heart:  Regular rate and rhythm; no murmurs, clicks, rubs, or gallops. Abdomen:  Normal bowel sounds.  No bruits.  Soft, non-tender and non-distended without masses,  hepatosplenomegaly or hernias noted.  No guarding or rebound tenderness.    Neurologic:  Alert and oriented x3;  grossly normal neurologically. Psych:  Alert and cooperative. Normal mood and affect.  Imaging Studies: No results found.  Assessment and Plan:   Gary BENNEY Sr. is a 74 y.o. y/o male here today to transfer care from [facility] clinic gastroenterology to our practice.  Last seen by them back in 2020 May for epigastric discomfort.  Being evaluated today for acid reflux.  Overdue for a screening colonoscopy average risk.  Last colonoscopy was back in 2006 with no polyps were seen.  Plan 1.  Screening colonoscopy 2.  GERD: Patient information to be provided.  Counseled on lifestyle changes including head elevation and to use a wedge pillow at night.  He has been taking his PPI after his meals and I told him to take it  before his breakfast on an empty stomach.  I will provide him a 90-day supply of Nexium.  No other red flag signs at this point of time.  I have discussed alternative options, risks & benefits,  which include, but are not limited to, bleeding, infection, perforation,respiratory complication & drug reaction.  The patient agrees with this plan & written consent will be obtained.     Dr [name] MD,MRCP [facility]) Gastroenterology/Hepatology Pager: [phone]

## 2020-01-16 ENCOUNTER — Ambulatory Visit: Payer: Medicare Other | Admitting: Nurse Practitioner

## 2020-01-16 DIAGNOSIS — I495 Sick sinus syndrome: Secondary | ICD-10-CM | POA: Diagnosis not present

## 2020-01-16 DIAGNOSIS — I208 Other forms of angina pectoris: Secondary | ICD-10-CM | POA: Diagnosis not present

## 2020-01-16 DIAGNOSIS — I10 Essential (primary) hypertension: Secondary | ICD-10-CM | POA: Diagnosis not present

## 2020-01-16 DIAGNOSIS — R001 Bradycardia, unspecified: Secondary | ICD-10-CM | POA: Diagnosis not present

## 2020-01-16 IMAGING — CR DG CHEST 2V
2 series · 2 of 2 positions shown · non-contrast
Comparison: May 08, 2018

CLINICAL DATA: Cough and shortness of breath.  Fever.

EXAM:
CHEST - 2 VIEW

[chest pa]
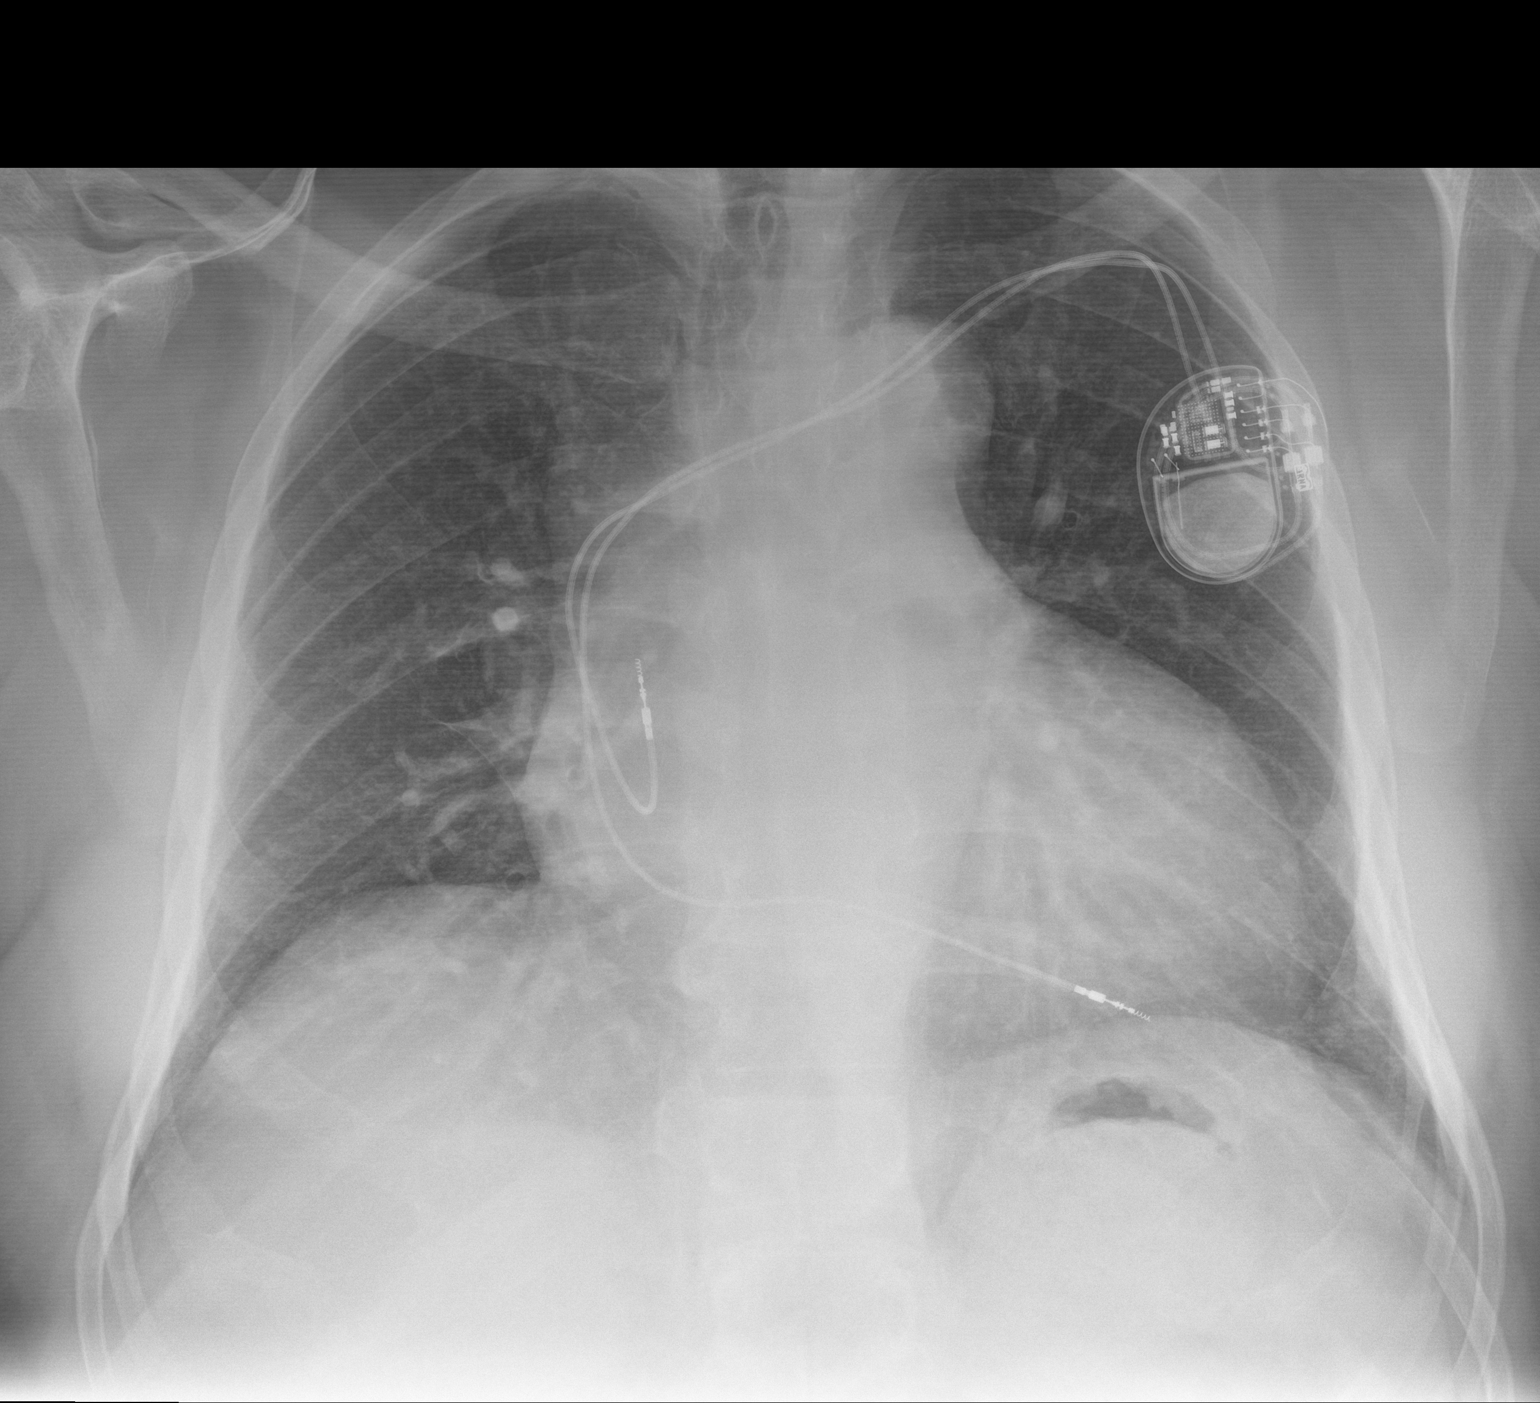

[chest lat]
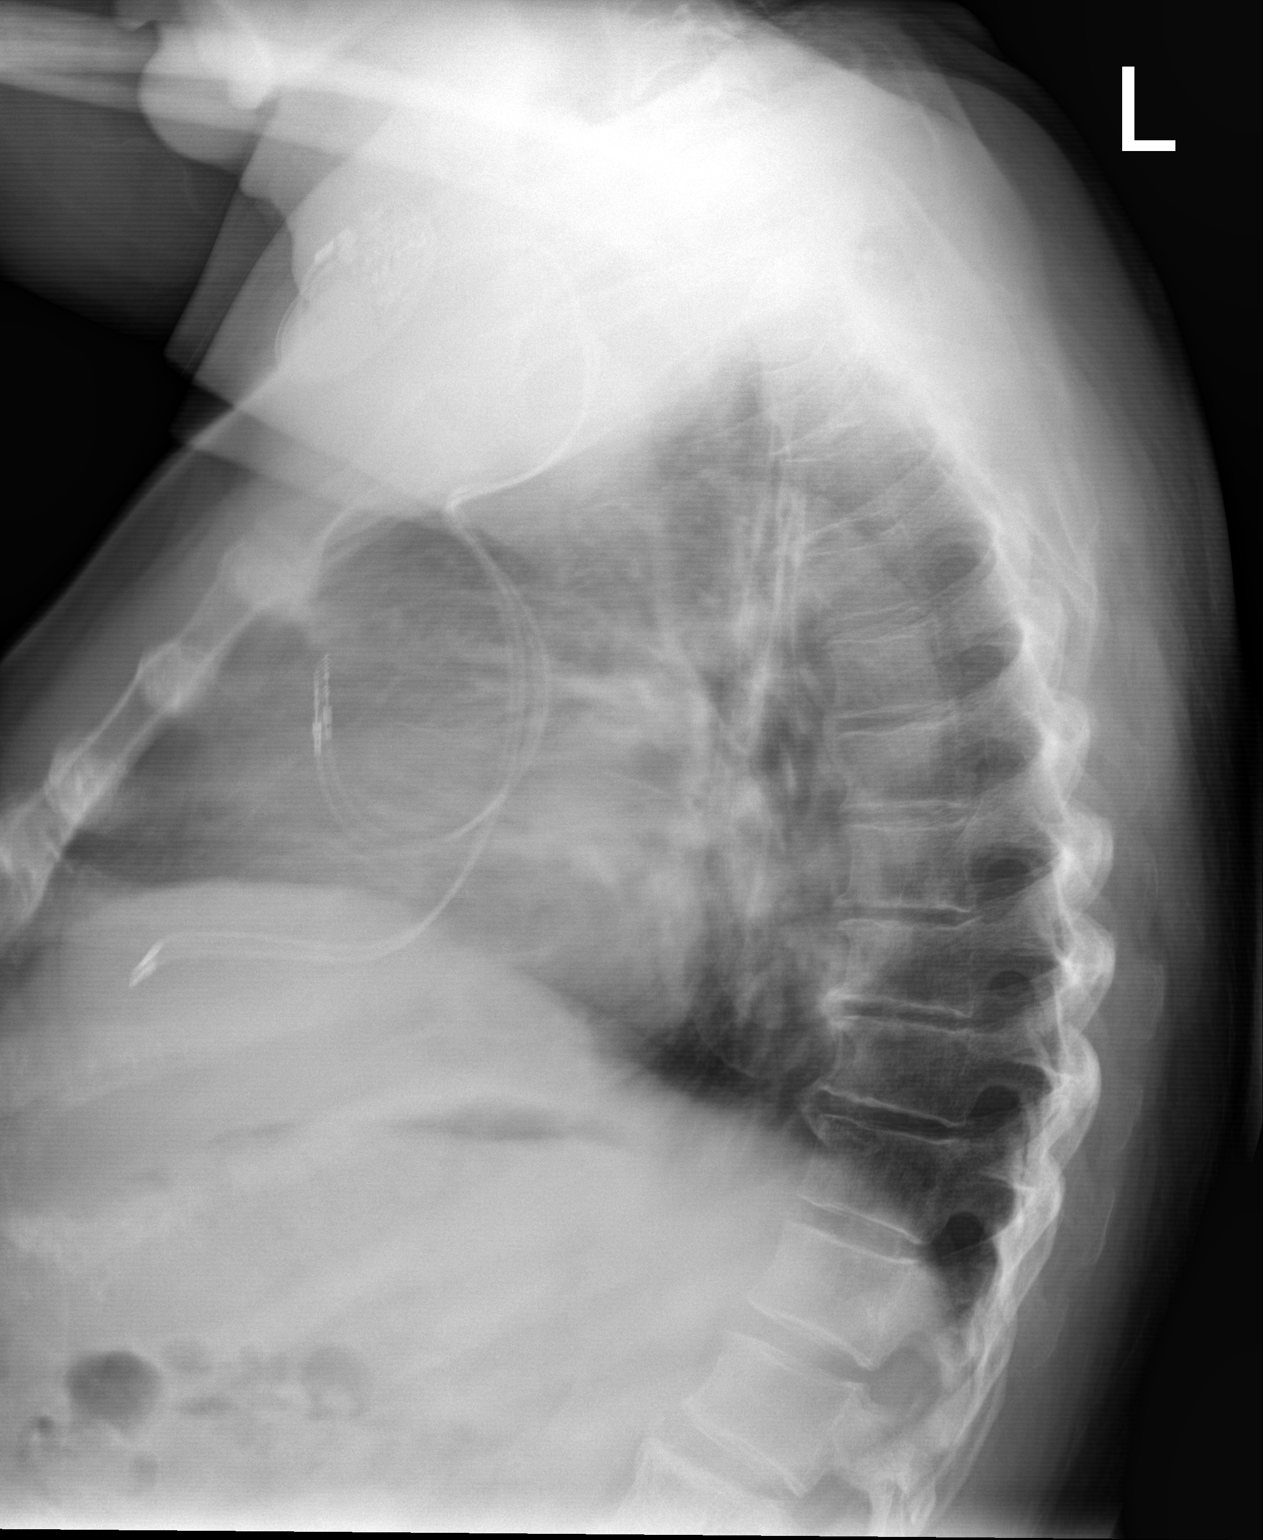

[2 of 2 positions shown; findings below may reference images not displayed]

FINDINGS: Stable cardiomegaly and pacemaker. No pneumothorax. No nodules or
masses. No focal infiltrates. No acute abnormalities.
IMPRESSION: No active cardiopulmonary disease.

## 2020-01-20 ENCOUNTER — Other Ambulatory Visit: Payer: Medicare Other

## 2020-01-23 ENCOUNTER — Other Ambulatory Visit: Payer: Self-pay

## 2020-01-23 ENCOUNTER — Other Ambulatory Visit
Admission: RE | Admit: 2020-01-23 | Discharge: 2020-01-23 | Disposition: A | Discharge status: Home / Self Care | Payer: Medicare Other | Source: Ambulatory Visit | Attending: Internal Medicine | Admitting: Internal Medicine

## 2020-01-23 DIAGNOSIS — Z01812 Encounter for preprocedural laboratory examination: Secondary | ICD-10-CM | POA: Insufficient documentation

## 2020-01-23 DIAGNOSIS — Z20822 Contact with and (suspected) exposure to covid-19: Secondary | ICD-10-CM | POA: Diagnosis not present

## 2020-01-24 ENCOUNTER — Ambulatory Visit
Admission: RE | Admit: 2020-01-24 | Discharge: 2020-01-24 | Disposition: A | Discharge status: Home / Self Care | Payer: Medicare Other | Attending: Gastroenterology | Admitting: Gastroenterology

## 2020-01-24 ENCOUNTER — Encounter: Payer: Self-pay | Admitting: Gastroenterology

## 2020-01-24 ENCOUNTER — Ambulatory Visit: Payer: Medicare Other | Admitting: Anesthesiology

## 2020-01-24 ENCOUNTER — Encounter
Admission: RE | Disposition: A | Discharge status: Home / Self Care | Payer: Self-pay | Source: Home / Self Care | Attending: Gastroenterology

## 2020-01-24 ENCOUNTER — Other Ambulatory Visit: Payer: Self-pay

## 2020-01-24 DIAGNOSIS — M109 Gout, unspecified: Secondary | ICD-10-CM | POA: Diagnosis not present

## 2020-01-24 DIAGNOSIS — D123 Benign neoplasm of transverse colon: Secondary | ICD-10-CM | POA: Insufficient documentation

## 2020-01-24 DIAGNOSIS — E119 Type 2 diabetes mellitus without complications: Secondary | ICD-10-CM | POA: Diagnosis not present

## 2020-01-24 DIAGNOSIS — K219 Gastro-esophageal reflux disease without esophagitis: Secondary | ICD-10-CM | POA: Insufficient documentation

## 2020-01-24 DIAGNOSIS — Z950 Presence of cardiac pacemaker: Secondary | ICD-10-CM | POA: Insufficient documentation

## 2020-01-24 DIAGNOSIS — Z7984 Long term (current) use of oral hypoglycemic drugs: Secondary | ICD-10-CM | POA: Insufficient documentation

## 2020-01-24 DIAGNOSIS — K5730 Diverticulosis of large intestine without perforation or abscess without bleeding: Secondary | ICD-10-CM | POA: Diagnosis not present

## 2020-01-24 DIAGNOSIS — Z79899 Other long term (current) drug therapy: Secondary | ICD-10-CM | POA: Diagnosis not present

## 2020-01-24 DIAGNOSIS — Z7982 Long term (current) use of aspirin: Secondary | ICD-10-CM | POA: Diagnosis not present

## 2020-01-24 DIAGNOSIS — K635 Polyp of colon: Secondary | ICD-10-CM

## 2020-01-24 DIAGNOSIS — E785 Hyperlipidemia, unspecified: Secondary | ICD-10-CM | POA: Insufficient documentation

## 2020-01-24 DIAGNOSIS — I10 Essential (primary) hypertension: Secondary | ICD-10-CM | POA: Insufficient documentation

## 2020-01-24 DIAGNOSIS — E1165 Type 2 diabetes mellitus with hyperglycemia: Secondary | ICD-10-CM | POA: Diagnosis not present

## 2020-01-24 DIAGNOSIS — D126 Benign neoplasm of colon, unspecified: Secondary | ICD-10-CM | POA: Diagnosis not present

## 2020-01-24 DIAGNOSIS — Z1211 Encounter for screening for malignant neoplasm of colon: Secondary | ICD-10-CM

## 2020-01-24 DIAGNOSIS — K5790 Diverticulosis of intestine, part unspecified, without perforation or abscess without bleeding: Secondary | ICD-10-CM | POA: Diagnosis not present

## 2020-01-24 HISTORY — PX: COLONOSCOPY WITH PROPOFOL: SHX5780

## 2020-01-24 LAB — SARS CORONAVIRUS 2 (TAT 6-24 HRS): SARS Coronavirus 2: NEGATIVE

## 2020-01-24 LAB — GLUCOSE, CAPILLARY: Glucose-Capillary: 105 mg/dL — ABNORMAL HIGH (ref 70–99)

## 2020-01-24 SURGERY — COLONOSCOPY WITH PROPOFOL
Anesthesia: General

## 2020-01-24 MED ORDER — LIDOCAINE HCL (CARDIAC) PF 100 MG/5ML IV SOSY
PREFILLED_SYRINGE | INTRAVENOUS | Status: DC | PRN
  Administered 2020-01-24: 100 mg via INTRAVENOUS

## 2020-01-24 MED ORDER — LIDOCAINE HCL (PF) 2 % IJ SOLN
INTRAMUSCULAR | Status: AC
  Filled 2020-01-24: qty 10

## 2020-01-24 MED ORDER — PROPOFOL 10 MG/ML IV BOLUS
INTRAVENOUS | Status: DC | PRN
  Administered 2020-01-24: 50 mg via INTRAVENOUS
  Administered 2020-01-24: 10 mg via INTRAVENOUS

## 2020-01-24 MED ORDER — PROPOFOL 500 MG/50ML IV EMUL
INTRAVENOUS | Status: AC
  Filled 2020-01-24: qty 50

## 2020-01-24 MED ORDER — SODIUM CHLORIDE 0.9 % IV SOLN: INTRAVENOUS | Status: DC

## 2020-01-24 MED ORDER — EPHEDRINE SULFATE 50 MG/ML IJ SOLN
INTRAMUSCULAR | Status: DC | PRN
  Administered 2020-01-24: 10 mg via INTRAVENOUS

## 2020-01-24 MED ORDER — PROPOFOL 500 MG/50ML IV EMUL
INTRAVENOUS | Status: DC | PRN
  Administered 2020-01-24: 180 ug/kg/min via INTRAVENOUS

## 2020-01-24 NOTE — Transfer of Care (Signed)
Immediate Anesthesia Transfer of Care Note  Patient: [name]  Procedure(s) Performed: COLONOSCOPY WITH PROPOFOL (N/A )  Patient Location: PACU  Anesthesia Type:General  Level of Consciousness: sedated  Airway & Oxygen Therapy: Patient Spontanous Breathing and Patient connected to nasal cannula oxygen  Post-op Assessment: Report given to RN and Post -op Vital signs reviewed and stable  Post vital signs: Reviewed and stable  Last Vitals:  Vitals Value Taken Time  BP 104/52 01/24/20 1129  Temp    Pulse 60 01/24/20 1130  Resp 16 01/24/20 1130  SpO2 100 % 01/24/20 1130  Vitals shown include unvalidated device data.  Last Pain:  Vitals:   01/24/20 1129  TempSrc:   PainSc: Asleep         Complications: No complications documented.

## 2020-01-24 NOTE — Anesthesia Postprocedure Evaluation (Signed)
Anesthesia Post Note  Patient: [name]  Procedure(s) Performed: COLONOSCOPY WITH PROPOFOL (N/A )  Patient location during evaluation: Endoscopy Anesthesia Type: General Level of consciousness: awake and alert Pain management: pain level controlled Vital Signs Assessment: post-procedure vital signs reviewed and stable Respiratory status: spontaneous breathing, nonlabored ventilation, respiratory function stable and patient connected to nasal cannula oxygen Cardiovascular status: blood pressure returned to baseline and stable Postop Assessment: no apparent nausea or vomiting Anesthetic complications: no   No complications documented.   Last Vitals:  Vitals:   01/24/20 0958 01/24/20 1129  BP: (!) 179/109 (!) 104/52  Pulse: 70 61  Resp: 20 18  Temp: (!) 35.6 C (!) 35.8 C  SpO2: 100% 100%    Last Pain:  Vitals:   01/24/20 1129  TempSrc: Temporal  PainSc: Asleep                 Arita Miss

## 2020-01-24 NOTE — H&P (Signed)
Gary Bellows, MD [address] [place], [place], [place], [address] [address], [place], [place], [place], [address] Phone: [phone]  Fax: [phone]  Primary Care Physician:  Gary Freshwater, NP   Pre-Procedure History & Physical: HPI:  Gary STOCK Sr. is a 74 y.o. male is here for an colonoscopy.   Past Medical History:  Diagnosis Date  . Diabetes mellitus without complication ([place])   . GERD (gastroesophageal reflux disease)   . Hyperlipidemia   . Hypertension     Past Surgical History:  Procedure Laterality Date  . CARDIAC CATHETERIZATION    . CORONARY/GRAFT ACUTE MI REVASCULARIZATION N/A 05/02/2018   Procedure: Coronary/Graft Acute MI Revascularization;  Surgeon: Gary Kida, MD;  Location: [facility];  Service: Cardiovascular;  Laterality: N/A;  . HERNIA REPAIR    . LEFT HEART CATH AND CORONARY ANGIOGRAPHY N/A 05/02/2018   Procedure: LEFT HEART CATH AND CORONARY ANGIOGRAPHY;  Surgeon: Gary Kida, MD;  Location: [facility];  Service: Cardiovascular;  Laterality: N/A;  . PACEMAKER INSERTION Left 05/03/2018   Procedure: INSERTION PACEMAKER;  Surgeon: Gary Cowman, MD;  Location: [facility];  Service: Cardiovascular;  Laterality: Left;  . TEMPORARY PACEMAKER N/A 05/02/2018   Procedure: TEMPORARY PACEMAKER;  Surgeon: Gary Kida, MD;  Location: [facility];  Service: Cardiovascular;  Laterality: N/A;  . TEMPORARY PACEMAKER Right 05/03/2018   Procedure: TEMPORARY PACEMAKER REMOVAL;  Surgeon: Gary Cowman, MD;  Location: [facility];  Service: Cardiovascular;  Laterality: Right;  . TONSILLECTOMY Bilateral    1992    Prior to Admission medications   Medication Sig Start Date End Date Taking? Authorizing Provider  allopurinol (ZYLOPRIM) 300 MG tablet TAKE 1 TABLET BY MOUTH AT BEDTIME FOR GOUT 02/28/19  Yes Gary Freshwater, NP  aspirin EC 81 MG tablet Take 81 mg by mouth daily. Swallow whole.    Yes [provider]  colchicine 0.6 MG tablet TAKE 1 TAB BY MOUTH TWICE DAILY X 3 DAYS FOR GOUT FLARE UP. OTHERWISE ONCE DAILY FOR PREVENTION 11/03/19  Yes Boscia, Heather E, NP  diltiazem (CARTIA XT) 240 MG 24 hr capsule Take 1 capsule (240 mg total) by mouth daily. 12/02/18  Yes Boscia, Greer Ee, NP  esomeprazole (NEXIUM) 40 MG capsule Take 1 capsule (40 mg total) by mouth daily at 12 noon. 12/29/19 03/28/20 Yes Gary Bellows, MD  losartan-hydrochlorothiazide [facility]) 100-12.5 MG tablet TAKE 1 TABLET BY MOUTH EVERY MORNING FOR BREAKFAST 12/05/19  Yes Gary Freshwater, NP  simvastatin (ZOCOR) 10 MG tablet TAKE 1 TABLET BY MOUTH EVERY DAY 07/17/19  Yes Lavera Guise, MD  tamsulosin (FLOMAX) 0.4 MG CAPS capsule TAKE 1 CAPSULE (0.4 MG TOTAL) BY MOUTH DAILY. 12/01/19  Yes Boscia, Greer Ee, NP  albuterol (VENTOLIN HFA) 108 (90 Base) MCG/ACT inhaler Inhale 1-2 puffs into the lungs every 4 (four) hours as needed for wheezing or shortness of breath. 03/06/19   Gary Kitchens, NP  benzonatate (TESSALON) 200 MG capsule Take 1 capsule (200 mg total) by mouth 3 (three) times daily as needed for cough. Patient not taking: Reported on 11/14/2019 03/06/19   Gary Kitchens, NP  hydrochlorothiazide (HYDRODIURIL) 12.5 MG tablet Take 1 tablet (12.5 mg total) by mouth daily. 09/19/19   Gary Freshwater, NP  metFORMIN (GLUCOPHAGE) 500 MG tablet Take 1 tablet (500 mg total) by mouth 2 (two) times daily with a meal. 06/23/19   Boscia, Greer Ee, NP  sucralfate ([place])  1 g tablet Take 1 tablet (1 g total) by mouth 4 (four) times daily. Patient taking differently: Take 1 g by mouth 2 (two) times daily.  05/08/18 05/08/19  Gary Newport, MD  triamcinolone (KENALOG) 0.025 % cream Apply 1 application topically 2 (two) times daily. Patient not taking: Reported on 11/28/2019 12/02/18   Gary Freshwater, NP    Allergies as of 12/30/2019  . (No Known Allergies)    Family History  Problem Relation Age of Onset  . Cirrhosis  Father   . Bone cancer Mother     Social History   Socioeconomic History  . Marital status: Married    Spouse name: Not on file  . Number of children: Not on file  . Years of education: Not on file  . Highest education level: Not on file  Occupational History  . Not on file  Tobacco Use  . Smoking status: Never Smoker  . Smokeless tobacco: Never Used  Vaping Use  . Vaping Use: Never used  Substance and Sexual Activity  . Alcohol use: No  . Drug use: No  . Sexual activity: Not on file  Other Topics Concern  . Not on file  Social History Narrative  . Not on file   Social Determinants of Health   Financial Resource Strain:   . Difficulty of Paying Living Expenses: Not on file  Food Insecurity:   . Worried About Charity fundraiser in the Last Year: Not on file  . Ran Out of Food in the Last Year: Not on file  Transportation Needs:   . Lack of Transportation (Medical): Not on file  . Lack of Transportation (Non-Medical): Not on file  Physical Activity:   . Days of Exercise per Week: Not on file  . Minutes of Exercise per Session: Not on file  Stress:   . Feeling of Stress : Not on file  Social Connections:   . Frequency of Communication with Friends and Family: Not on file  . Frequency of Social Gatherings with Friends and Family: Not on file  . Attends Religious Services: Not on file  . Active Member of Clubs or Organizations: Not on file  . Attends Archivist Meetings: Not on file  . Marital Status: Not on file  Intimate Partner Violence:   . Fear of Current or Ex-Partner: Not on file  . Emotionally Abused: Not on file  . Physically Abused: Not on file  . Sexually Abused: Not on file    Review of Systems: See HPI, otherwise negative ROS  Physical Exam: BP (!) 179/109   Pulse 70   Temp (!) 96 F (35.6 C) (Temporal)   Resp 20   Ht 6\' 3"  (1.905 m)   Wt 108.4 kg   SpO2 100%   BMI 29.87 kg/m  General:   Alert,  pleasant and cooperative in  NAD Head:  Normocephalic and atraumatic. Neck:  Supple; no masses or thyromegaly. Lungs:  Clear throughout to auscultation, normal respiratory effort.    Heart:  +S1, +S2, Regular rate and rhythm, No edema. Abdomen:  Soft, nontender and nondistended. Normal bowel sounds, without guarding, and without rebound.   Neurologic:  Alert and  oriented x4;  grossly normal neurologically.  Impression/Plan: Gary Alar Sr. is here for an colonoscopy to be performed for Screening colonoscopy average risk   Risks, benefits, limitations, and alternatives regarding  colonoscopy have been reviewed with the patient.  Questions have been answered.  All parties agreeable.  Gary Bellows, MD  01/24/2020, 11:24 AM

## 2020-01-24 NOTE — Op Note (Signed)
[facility] Patient Name: [name] Procedure Date: 01/24/2020 10:59 AM MRN: [id number] Account #: [id number] Date of Birth: [birth year] Admit Type: Outpatient Age: 74 Room: [facility] ENDO ROOM 1 Gender: Male Note Status: Finalized Procedure:             Colonoscopy Indications:           Screening for colorectal malignant neoplasm Providers:             [name] MD, MD Referring MD:          No Local Md, MD (Referring MD) Medicines:             Monitored Anesthesia Care Complications:         No immediate complications. Procedure:             Pre-Anesthesia Assessment:                        - Prior to the procedure, a History and Physical was                         performed, and patient medications, allergies and                         sensitivities were reviewed. The patient's tolerance                         of previous anesthesia was reviewed.                        - The risks and benefits of the procedure and the                         sedation options and risks were discussed with the                         patient. All questions were answered and informed                         consent was obtained.                        - ASA Grade Assessment: II - A patient with mild                         systemic disease.                        After obtaining informed consent, the colonoscope was                         passed under direct vision. Throughout the procedure,                         the patient's blood pressure, pulse, and oxygen                         saturations were monitored continuously. The                         Colonoscope was introduced through the anus  and                         advanced to the the cecum, identified by the                         appendiceal orifice. The colonoscopy was performed                         with ease. The patient tolerated the procedure well.                         The quality of the  bowel preparation was excellent. Findings:      The perianal and digital rectal examinations were normal.      Two sessile polyps were found in the transverse colon. The polyps were 4       to 5 mm in size. These polyps were removed with a cold snare. Resection       and retrieval were complete.      Multiple small-mouthed diverticula were found in the sigmoid colon.      The exam was otherwise without abnormality on direct and retroflexion       views. Impression:            - Two 4 to 5 mm polyps in the transverse colon,                         removed with a cold snare. Resected and retrieved.                        - Diverticulosis in the sigmoid colon.                        - The examination was otherwise normal on direct and                         retroflexion views. Recommendation:        - Discharge patient to home (with escort).                        - Resume previous diet.                        - Continue present medications.                        - Repeat colonoscopy for surveillance based on                         pathology results. Procedure Code(s):     --- Professional ---                        [phone], Colonoscopy, flexible; with removal of                         tumor(s), polyp(s), or other lesion(s) by snare                         technique Diagnosis Code(s):     --- Professional ---  Z12.11, Encounter for screening for malignant neoplasm                         of colon                        K63.5, Polyp of colon                        K57.30, Diverticulosis of large intestine without                         perforation or abscess without bleeding CPT copyright 2019 American Medical Association. All rights reserved. The codes documented in this report are preliminary and upon coder review may  be revised to meet current compliance requirements. [name], MD [name] MD, MD 01/24/2020 11:22:24 AM This report has been signed  electronically. Number of Addenda: 0 Note Initiated On: 01/24/2020 10:59 AM Scope Withdrawal Time: 0 hours 9 minutes 59 seconds  Total Procedure Duration: 0 hours 12 minutes 9 seconds  Estimated Blood Loss:  Estimated blood loss: none.      [facility]

## 2020-01-24 NOTE — Anesthesia Preprocedure Evaluation (Signed)
Anesthesia Evaluation  Patient identified by MRN, date of birth, ID band Patient awake    Reviewed: Allergy & Precautions, NPO status , Patient's Chart, lab work & pertinent test results  History of Anesthesia Complications Negative for: history of anesthetic complications  Airway Mallampati: III  TM Distance: >3 FB Neck ROM: Full    Dental  (+) Missing   Pulmonary neg pulmonary ROS, neg sleep apnea, neg COPD,    breath sounds clear to auscultation- rhonchi (-) wheezing      Cardiovascular hypertension, (-) CAD, (-) Past MI, (-) Cardiac Stents and (-) CABG (-) dysrhythmias (complete heart block, temporary pacing wires in place) + pacemaker + Valvular Problems/Murmurs MR  Rhythm:Regular Rate:Normal - Systolic murmurs and - Diastolic murmurs Pacemaker placed 2019 for complete heart block TTE 2019: - Left ventricle: The cavity size was normal. Systolic function was  normal. The estimated ejection fraction was in the range of 50%  to 55%.  - Mitral valve: There was moderate regurgitation.  - Left atrium: The atrium was mildly dilated.    Neuro/Psych neg Seizures negative neurological ROS  negative psych ROS   GI/Hepatic Neg liver ROS, GERD  ,  Endo/Other  diabetes  Renal/GU negative Renal ROS     Musculoskeletal negative musculoskeletal ROS (+)   Abdominal (+) - obese,   Peds  Hematology negative hematology ROS (+)   Anesthesia Other Findings Past Medical History: No date: Diabetes mellitus without complication (HCC) No date: GERD (gastroesophageal reflux disease) No date: Hyperlipidemia No date: Hypertension   Reproductive/Obstetrics                             Anesthesia Physical  Anesthesia Plan  ASA: III  Anesthesia Plan: General   Post-op Pain Management:    Induction: Intravenous  PONV Risk Score and Plan: 2 and Propofol infusion and Ondansetron  Airway Management  Planned: Natural Airway  Additional Equipment: None  Intra-op Plan:   Post-operative Plan:   Informed Consent: I have reviewed the patients History and Physical, chart, labs and discussed the procedure including the risks, benefits and alternatives for the proposed anesthesia with the patient or authorized representative who has indicated his/her understanding and acceptance.     Dental advisory given  Plan Discussed with: CRNA and Anesthesiologist  Anesthesia Plan Comments: (Discussed risks of anesthesia with patient, including possibility of difficulty with spontaneous ventilation under anesthesia necessitating airway intervention, PONV, and rare risks such as cardiac or respiratory or neurological events. Patient understands.)        Anesthesia Quick Evaluation

## 2020-01-25 ENCOUNTER — Encounter: Payer: Self-pay | Admitting: Gastroenterology

## 2020-01-25 LAB — SURGICAL PATHOLOGY

## 2020-01-30 ENCOUNTER — Encounter: Payer: Self-pay | Admitting: Gastroenterology

## 2020-02-03 ENCOUNTER — Ambulatory Visit: Payer: Medicare Other | Admitting: Nurse Practitioner

## 2020-03-11 ENCOUNTER — Other Ambulatory Visit: Payer: Self-pay | Admitting: Gastroenterology

## 2020-03-13 ENCOUNTER — Other Ambulatory Visit: Payer: Self-pay | Admitting: Gastroenterology

## 2020-03-13 NOTE — Telephone Encounter (Signed)
Refill has been sent to pharmacy.  

## 2020-03-13 NOTE — Telephone Encounter (Signed)
esomeprazole (NEXIUM) 40 MG capsule  [facility]  Looks like it was refilled 12/29/19 but Pharmacy is saying no refills.

## 2020-05-27 ENCOUNTER — Other Ambulatory Visit: Payer: Self-pay

## 2020-05-27 MED ORDER — TAMSULOSIN HCL 0.4 MG PO CAPS: ORAL_CAPSULE | ORAL | 0 refills | Status: DC

## 2020-05-29 ENCOUNTER — Other Ambulatory Visit: Payer: Self-pay | Admitting: Internal Medicine

## 2020-06-01 ENCOUNTER — Other Ambulatory Visit: Payer: Self-pay

## 2020-06-01 MED ORDER — LOSARTAN POTASSIUM-HCTZ 100-12.5 MG PO TABS: 1.0000 | ORAL_TABLET | Freq: Every day | ORAL | 0 refills | Status: DC

## 2020-06-04 ENCOUNTER — Other Ambulatory Visit: Payer: Self-pay

## 2020-06-04 MED ORDER — ALLOPURINOL 300 MG PO TABS: ORAL_TABLET | ORAL | 0 refills | Status: DC

## 2020-06-07 ENCOUNTER — Ambulatory Visit (INDEPENDENT_AMBULATORY_CARE_PROVIDER_SITE_OTHER): Payer: Medicare HMO | Admitting: Physician Assistant

## 2020-06-07 ENCOUNTER — Encounter: Payer: Self-pay | Admitting: Physician Assistant

## 2020-06-07 VITALS — BP 163/97 | HR 85 | Temp 97.6°F | Resp 16 | Ht 75.5 in | Wt 232.6 lb

## 2020-06-07 DIAGNOSIS — I10 Essential (primary) hypertension: Secondary | ICD-10-CM

## 2020-06-07 DIAGNOSIS — R600 Localized edema: Secondary | ICD-10-CM

## 2020-06-07 DIAGNOSIS — E1165 Type 2 diabetes mellitus with hyperglycemia: Secondary | ICD-10-CM

## 2020-06-07 DIAGNOSIS — S81802A Unspecified open wound, left lower leg, initial encounter: Secondary | ICD-10-CM

## 2020-06-07 LAB — POCT GLYCOSYLATED HEMOGLOBIN (HGB A1C): Hemoglobin A1C: 6.6 % — AB (ref 4.0–5.6)

## 2020-06-07 MED ORDER — AMOXICILLIN-POT CLAVULANATE 875-125 MG PO TABS: 1.0000 | ORAL_TABLET | Freq: Two times a day (BID) | ORAL | 0 refills | Status: DC

## 2020-06-07 MED ORDER — FUROSEMIDE 20 MG PO TABS: 20.0000 mg | ORAL_TABLET | Freq: Every day | ORAL | 0 refills | Status: DC

## 2020-06-07 MED ORDER — HYDROCODONE-ACETAMINOPHEN 10-325 MG PO TABS: 1.0000 | ORAL_TABLET | Freq: Three times a day (TID) | ORAL | 0 refills | Status: DC | PRN

## 2020-06-07 NOTE — Progress Notes (Signed)
[facility] [place], [place] [address]  Internal MEDICINE  Office Visit Note  Patient Name: [name]  [id number]  [id number]  Date of Service: 06/09/2020  Chief Complaint  Patient presents with  . Leg Pain    Sore on back of left leg     HPI Pt is here for a sick visit. Last Thursday he noticed an open wound on back of L calf. It is getting worse with redness, pain and swelling. He thinks he scrached it and it hasn't healed. Denies fever or chills. He is taking Tylenol for pain, he took 2 last night and then 2 this AM. It makes the pain bearable. He also put neosporin on it and is keeping it clean. His BP was high today and states he took his meds this morning, but he is wound up because he is missing work currently. He is a meat cutter and owns the business. He is unable to check BP at home. Denies HA and vision changes.   Current Medication:  Outpatient Encounter Medications as of 06/07/2020  Medication Sig  . amoxicillin-clavulanate (AUGMENTIN) 875-125 MG tablet Take 1 tablet by mouth 2 (two) times daily. Take with food  . furosemide (LASIX) 20 MG tablet Take 1 tablet (20 mg total) by mouth daily for 3 days.  Marland Kitchen HYDROcodone-acetaminophen (NORCO) 10-325 MG tablet Take 1 tablet by mouth every 8 (eight) hours as needed for up to 5 days.  Marland Kitchen albuterol (VENTOLIN HFA) 108 (90 Base) MCG/ACT inhaler Inhale 1-2 puffs into the lungs every 4 (four) hours as needed for wheezing or shortness of breath.  . allopurinol (ZYLOPRIM) 300 MG tablet TAKE 1 TABLET BY MOUTH AT BEDTIME FOR GOUT  . aspirin EC 81 MG tablet Take 81 mg by mouth daily. Swallow whole.  . benzonatate (TESSALON) 200 MG capsule Take 1 capsule (200 mg total) by mouth 3 (three) times daily as needed for cough. (Patient not taking: Reported on 11/14/2019)  . colchicine 0.6 MG tablet TAKE 1 TAB BY MOUTH TWICE DAILY X 3 DAYS FOR GOUT FLARE UP. OTHERWISE ONCE DAILY FOR PREVENTION  . diltiazem (CARTIA XT)  240 MG 24 hr capsule Take 1 capsule (240 mg total) by mouth daily.  Marland Kitchen esomeprazole (NEXIUM) 40 MG capsule TAKE 1 CAPSULE BY MOUTH DAILY AT 12 NOON.  Marland Kitchen losartan-hydrochlorothiazide (HYZAAR) 100-12.5 MG tablet Take 1 tablet by mouth daily.  . metFORMIN (GLUCOPHAGE) 500 MG tablet Take 1 tablet (500 mg total) by mouth 2 (two) times daily with a meal.  . simvastatin (ZOCOR) 10 MG tablet TAKE 1 TABLET BY MOUTH EVERY DAY  . sucralfate (CARAFATE) 1 g tablet Take 1 tablet (1 g total) by mouth 4 (four) times daily. (Patient taking differently: Take 1 g by mouth 2 (two) times daily. )  . tamsulosin (FLOMAX) 0.4 MG CAPS capsule TAKE 1 CAPSULE (0.4 MG TOTAL) BY MOUTH DAILY.  Marland Kitchen triamcinolone (KENALOG) 0.025 % cream Apply 1 application topically 2 (two) times daily. (Patient not taking: Reported on 11/28/2019)  . [DISCONTINUED] hydrochlorothiazide (HYDRODIURIL) 12.5 MG tablet Take 1 tablet (12.5 mg total) by mouth daily.   No facility-administered encounter medications on file as of 06/07/2020.      Medical History: Past Medical History:  Diagnosis Date  . Diabetes mellitus without complication ([place])   . GERD (gastroesophageal reflux disease)   . Hyperlipidemia   . Hypertension      Vital Signs: BP (!) 163/97   Pulse 85   Temp  97.6 F (36.4 C)   Resp 16   Ht 6' 3.5" (1.918 m)   Wt 232 lb 9.6 oz (105.5 kg)   SpO2 97%   BMI 28.69 kg/m    Review of Systems  Constitutional: Negative for fatigue and fever.  HENT: Negative for congestion, mouth sores and postnasal drip.   Respiratory: Negative for cough.   Cardiovascular: Positive for leg swelling. Negative for chest pain.  Genitourinary: Negative for flank pain.  Skin: Positive for wound.       Redness, swelling, warmth, and pain with blistered wound on L calf  Neurological: Negative for dizziness and headaches.  Psychiatric/Behavioral: Negative.  The patient is not nervous/anxious.     Physical Exam Constitutional:      General: He is  not in acute distress.    Appearance: He is well-developed. He is not diaphoretic.  HENT:     Head: Normocephalic and atraumatic.     Mouth/Throat:     Pharynx: No oropharyngeal exudate.  Eyes:     Pupils: Pupils are equal, round, and reactive to light.  Neck:     Thyroid: No thyromegaly.     Vascular: No JVD.     Trachea: No tracheal deviation.  Cardiovascular:     Rate and Rhythm: Normal rate and regular rhythm.     Pulses: Normal pulses.     Heart sounds: Normal heart sounds. No murmur heard. No friction rub. No gallop.   Pulmonary:     Effort: Pulmonary effort is normal. No respiratory distress.     Breath sounds: No wheezing or rales.  Chest:     Chest wall: No tenderness.  Abdominal:     General: Bowel sounds are normal.     Palpations: Abdomen is soft.  Musculoskeletal:        General: Normal range of motion.     Cervical back: Normal range of motion and neck supple.     Left lower leg: Edema present.  Lymphadenopathy:     Cervical: No cervical adenopathy.  Skin:    General: Skin is warm and dry.     Findings: Erythema and lesion present.     Comments: 1x1cm scabbed wound on back of left calf with LE edema, warmth, and redness. Acutely tender to the touch. He has full sensation.  Neurological:     Mental Status: He is alert and oriented to person, place, and time.     Cranial Nerves: No cranial nerve deficit.  Psychiatric:        Behavior: Behavior normal.        Thought Content: Thought content normal.        Judgment: Judgment normal.       Assessment/Plan: 1. Type 2 diabetes mellitus with hyperglycemia, without long-term current use of insulin (HCC) - POCT glycosylated hemoglobin (Hb A1C) is 6.6 today. Well controlled on Metformin 500mg  BID.  2. Open leg wound, left, initial encounter Concern for infection of partially scabbed over wound. Will start on ABX. Also given Norco for pain to be used as needed. Will follow closely and reassess on Monday. -  amoxicillin-clavulanate (AUGMENTIN) 875-125 MG tablet; Take 1 tablet by mouth 2 (two) times daily. Take with food  Dispense: 20 tablet; Refill: 0 - HYDROcodone-acetaminophen (NORCO) 10-325 MG tablet; Take 1 tablet by mouth every 8 (eight) hours as needed for up to 5 days.  Dispense: 15 tablet; Refill: 0 - furosemide (LASIX) 20 MG tablet; Take 1 tablet (20 mg total) by mouth daily for  3 days.  Dispense: 3 tablet; Refill: 0  3. Lower extremity edema Will start 20mg  lasix for the 3 days to help with LE edema as well as BP during current pain. - furosemide (LASIX) 20 MG tablet; Take 1 tablet (20 mg total) by mouth daily for 3 days.  Dispense: 3 tablet; Refill: 0  4. HTN Continue Diltiazem and Hyzaar. 20mg  Lasix added for 3 days to help with LE edema and elevated BP today. Will reassess on Monday.   General Counseling: [name] understanding of the findings of todays visit and agrees with plan of treatment. I have discussed any further diagnostic evaluation that may be needed or ordered today. We also reviewed his medications today. he has been encouraged to call the office with any questions or concerns that should arise related to todays visit.    Counseling: Hypertension Counseling:   The following hypertensive lifestyle modification were recommended and discussed:  1. Limiting alcohol intake to less than 1 oz/day of ethanol:(24 oz of beer or 8 oz of wine or 2 oz of 100-proof whiskey). 2. Take baby ASA 81 mg daily. 3. Importance of regular aerobic exercise and losing weight. 4. Reduce dietary saturated fat and cholesterol intake for overall cardiovascular health. 5. Maintaining adequate dietary potassium, calcium, and magnesium intake. 6. Regular monitoring of the blood pressure. 7. Reduce sodium intake to less than 100 mmol/day (less than 2.3 gm of sodium or less than 6 gm of sodium choride)    Orders Placed This Encounter  Procedures  . POCT glycosylated hemoglobin (Hb A1C)     Meds ordered this encounter  Medications  . amoxicillin-clavulanate (AUGMENTIN) 875-125 MG tablet    Sig: Take 1 tablet by mouth 2 (two) times daily. Take with food    Dispense:  20 tablet    Refill:  0  . HYDROcodone-acetaminophen (NORCO) 10-325 MG tablet    Sig: Take 1 tablet by mouth every 8 (eight) hours as needed for up to 5 days.    Dispense:  15 tablet    Refill:  0  . furosemide (LASIX) 20 MG tablet    Sig: Take 1 tablet (20 mg total) by mouth daily for 3 days.    Dispense:  3 tablet    Refill:  0    Time spent:30 Minutes

## 2020-06-11 ENCOUNTER — Encounter: Payer: Self-pay | Admitting: Physician Assistant

## 2020-06-11 ENCOUNTER — Inpatient Hospital Stay
Admission: EM | Admit: 2020-06-11 | Discharge: 2020-06-13 | DRG: 603 | Disposition: A | Discharge status: Home / Self Care | Payer: Medicare HMO | Attending: Internal Medicine | Admitting: Internal Medicine

## 2020-06-11 ENCOUNTER — Encounter: Payer: Self-pay | Admitting: Emergency Medicine

## 2020-06-11 ENCOUNTER — Ambulatory Visit (INDEPENDENT_AMBULATORY_CARE_PROVIDER_SITE_OTHER): Payer: Medicare HMO | Admitting: Physician Assistant

## 2020-06-11 ENCOUNTER — Emergency Department: Payer: Medicare HMO

## 2020-06-11 ENCOUNTER — Other Ambulatory Visit: Payer: Self-pay

## 2020-06-11 ENCOUNTER — Inpatient Hospital Stay: Payer: Medicare HMO

## 2020-06-11 VITALS — BP 185/99 | HR 87 | Temp 97.6°F | Resp 16 | Ht 75.5 in | Wt 235.8 lb

## 2020-06-11 DIAGNOSIS — I10 Essential (primary) hypertension: Secondary | ICD-10-CM | POA: Diagnosis not present

## 2020-06-11 DIAGNOSIS — Z809 Family history of malignant neoplasm, unspecified: Secondary | ICD-10-CM

## 2020-06-11 DIAGNOSIS — Z7984 Long term (current) use of oral hypoglycemic drugs: Secondary | ICD-10-CM

## 2020-06-11 DIAGNOSIS — Z79899 Other long term (current) drug therapy: Secondary | ICD-10-CM

## 2020-06-11 DIAGNOSIS — M7989 Other specified soft tissue disorders: Secondary | ICD-10-CM | POA: Diagnosis not present

## 2020-06-11 DIAGNOSIS — E119 Type 2 diabetes mellitus without complications: Secondary | ICD-10-CM | POA: Diagnosis present

## 2020-06-11 DIAGNOSIS — M109 Gout, unspecified: Secondary | ICD-10-CM

## 2020-06-11 DIAGNOSIS — L02416 Cutaneous abscess of left lower limb: Secondary | ICD-10-CM

## 2020-06-11 DIAGNOSIS — C7982 Secondary malignant neoplasm of genital organs: Secondary | ICD-10-CM | POA: Diagnosis not present

## 2020-06-11 DIAGNOSIS — K219 Gastro-esophageal reflux disease without esophagitis: Secondary | ICD-10-CM | POA: Diagnosis present

## 2020-06-11 DIAGNOSIS — L03116 Cellulitis of left lower limb: Secondary | ICD-10-CM

## 2020-06-11 DIAGNOSIS — Z87891 Personal history of nicotine dependence: Secondary | ICD-10-CM

## 2020-06-11 DIAGNOSIS — M19071 Primary osteoarthritis, right ankle and foot: Secondary | ICD-10-CM | POA: Diagnosis not present

## 2020-06-11 DIAGNOSIS — Z7982 Long term (current) use of aspirin: Secondary | ICD-10-CM

## 2020-06-11 DIAGNOSIS — R600 Localized edema: Secondary | ICD-10-CM | POA: Diagnosis not present

## 2020-06-11 DIAGNOSIS — M79662 Pain in left lower leg: Secondary | ICD-10-CM | POA: Diagnosis not present

## 2020-06-11 DIAGNOSIS — E1165 Type 2 diabetes mellitus with hyperglycemia: Secondary | ICD-10-CM

## 2020-06-11 DIAGNOSIS — Z20822 Contact with and (suspected) exposure to covid-19: Secondary | ICD-10-CM | POA: Diagnosis not present

## 2020-06-11 DIAGNOSIS — M79605 Pain in left leg: Secondary | ICD-10-CM | POA: Diagnosis not present

## 2020-06-11 DIAGNOSIS — L0390 Cellulitis, unspecified: Secondary | ICD-10-CM | POA: Diagnosis present

## 2020-06-11 DIAGNOSIS — E785 Hyperlipidemia, unspecified: Secondary | ICD-10-CM | POA: Diagnosis not present

## 2020-06-11 LAB — COMPREHENSIVE METABOLIC PANEL
ALT: 19 U/L (ref 0–44)
AST: 16 U/L (ref 15–41)
Albumin: 4 g/dL (ref 3.5–5.0)
Alkaline Phosphatase: 78 U/L (ref 38–126)
Anion gap: 12 (ref 5–15)
BUN: 22 mg/dL (ref 8–23)
CO2: 27 mmol/L (ref 22–32)
Calcium: 9.7 mg/dL (ref 8.9–10.3)
Chloride: 100 mmol/L (ref 98–111)
Creatinine, Ser: 1 mg/dL (ref 0.61–1.24)
GFR, Estimated: >60 mL/min (ref >60)
Glucose, Bld: 126 mg/dL — ABNORMAL HIGH (ref 70–99)
Potassium: 4.3 mmol/L (ref 3.5–5.1)
Sodium: 139 mmol/L (ref 135–145)
Total Bilirubin: 0.7 mg/dL (ref 0.3–1.2)
Total Protein: 7.5 g/dL (ref 6.5–8.1)

## 2020-06-11 LAB — CBC WITH DIFFERENTIAL/PLATELET
Abs Immature Granulocytes: 0.02 10*3/uL (ref 0.00–0.07)
Basophils Absolute: 0.1 10*3/uL (ref 0.0–0.1)
Basophils Relative: 1 %
Eosinophils Absolute: 0.1 10*3/uL (ref 0.0–0.5)
Eosinophils Relative: 2 %
HCT: 47.5 % (ref 39.0–52.0)
Hemoglobin: 15.2 g/dL (ref 13.0–17.0)
Immature Granulocytes: 0 %
Lymphocytes Relative: 22 %
Lymphs Abs: 1.3 10*3/uL (ref 0.7–4.0)
MCH: 26.6 pg (ref 26.0–34.0)
MCHC: 32 g/dL (ref 30.0–36.0)
MCV: 83 fL (ref 80.0–100.0)
Monocytes Absolute: 0.5 10*3/uL (ref 0.1–1.0)
Monocytes Relative: 9 %
Neutro Abs: 3.9 10*3/uL (ref 1.7–7.7)
Neutrophils Relative %: 66 %
Platelets: 219 10*3/uL (ref 150–400)
RBC: 5.72 MIL/uL (ref 4.22–5.81)
RDW: 14.3 % (ref 11.5–15.5)
WBC: 6 10*3/uL (ref 4.0–10.5)
nRBC: 0 % (ref 0.0–0.2)

## 2020-06-11 LAB — TSH: TSH: 0.714 u[IU]/mL (ref 0.350–4.500)

## 2020-06-11 LAB — CREATININE, SERUM
Creatinine, Ser: 0.83 mg/dL (ref 0.61–1.24)
GFR, Estimated: >60 mL/min (ref >60)

## 2020-06-11 LAB — CBC
HCT: 44.9 % (ref 39.0–52.0)
Hemoglobin: 14.9 g/dL (ref 13.0–17.0)
MCH: 26.9 pg (ref 26.0–34.0)
MCHC: 33.2 g/dL (ref 30.0–36.0)
MCV: 81.2 fL (ref 80.0–100.0)
Platelets: 203 10*3/uL (ref 150–400)
RBC: 5.53 MIL/uL (ref 4.22–5.81)
RDW: 14.1 % (ref 11.5–15.5)
WBC: 6.3 10*3/uL (ref 4.0–10.5)
nRBC: 0 % (ref 0.0–0.2)

## 2020-06-11 LAB — CK: Total CK: 70 U/L (ref 49–397)

## 2020-06-11 LAB — LACTIC ACID, PLASMA
Lactic Acid, Venous: 1.4 mmol/L (ref 0.5–1.9)
Lactic Acid, Venous: 1.6 mmol/L (ref 0.5–1.9)

## 2020-06-11 LAB — PROCALCITONIN: Procalcitonin: <0.1 ng/mL

## 2020-06-11 MED ORDER — SUCRALFATE 1 G PO TABS
1.0000 g | ORAL_TABLET | Freq: Two times a day (BID) | ORAL | Status: DC
  Administered 2020-06-12 – 2020-06-13 (×4): 1 g via ORAL
  Filled 2020-06-11 (×5): qty 1

## 2020-06-11 MED ORDER — ONDANSETRON HCL 4 MG/2ML IJ SOLN
4.0000 mg | Freq: Four times a day (QID) | INTRAMUSCULAR | Status: DC | PRN
  Filled 2020-06-11: qty 2

## 2020-06-11 MED ORDER — ASPIRIN EC 81 MG PO TBEC
81.0000 mg | DELAYED_RELEASE_TABLET | Freq: Every day | ORAL | Status: DC
  Administered 2020-06-11 – 2020-06-13 (×3): 81 mg via ORAL
  Filled 2020-06-11 (×3): qty 1

## 2020-06-11 MED ORDER — INSULIN ASPART 100 UNIT/ML ~~LOC~~ SOLN
0.0000 [IU] | Freq: Three times a day (TID) | SUBCUTANEOUS | Status: DC
  Administered 2020-06-12: 2 [IU] via SUBCUTANEOUS
  Administered 2020-06-12: 1 [IU] via SUBCUTANEOUS
  Administered 2020-06-12: 2 [IU] via SUBCUTANEOUS
  Filled 2020-06-11 (×3): qty 1

## 2020-06-11 MED ORDER — KETOROLAC TROMETHAMINE 30 MG/ML IJ SOLN
15.0000 mg | Freq: Once | INTRAMUSCULAR | Status: AC
  Administered 2020-06-11: 15 mg via INTRAVENOUS
  Filled 2020-06-11: qty 1

## 2020-06-11 MED ORDER — LOSARTAN POTASSIUM-HCTZ 100-12.5 MG PO TABS: 1.0000 | ORAL_TABLET | Freq: Every day | ORAL | Status: DC

## 2020-06-11 MED ORDER — PANTOPRAZOLE SODIUM 40 MG PO TBEC
40.0000 mg | DELAYED_RELEASE_TABLET | Freq: Every day | ORAL | Status: DC
  Administered 2020-06-12 – 2020-06-13 (×2): 40 mg via ORAL
  Filled 2020-06-11 (×2): qty 1

## 2020-06-11 MED ORDER — ALLOPURINOL 300 MG PO TABS
300.0000 mg | ORAL_TABLET | Freq: Every day | ORAL | Status: DC
  Administered 2020-06-12: 300 mg via ORAL
  Filled 2020-06-11 (×4): qty 1

## 2020-06-11 MED ORDER — VANCOMYCIN HCL IN DEXTROSE 1-5 GM/200ML-% IV SOLN
1000.0000 mg | Freq: Once | INTRAVENOUS | Status: AC
  Administered 2020-06-11: 1000 mg via INTRAVENOUS
  Filled 2020-06-11: qty 200

## 2020-06-11 MED ORDER — TAMSULOSIN HCL 0.4 MG PO CAPS
0.4000 mg | ORAL_CAPSULE | Freq: Every day | ORAL | Status: DC
  Administered 2020-06-11 – 2020-06-12 (×2): 0.4 mg via ORAL
  Filled 2020-06-11 (×2): qty 1

## 2020-06-11 MED ORDER — DILTIAZEM HCL ER COATED BEADS 240 MG PO CP24
240.0000 mg | ORAL_CAPSULE | Freq: Every day | ORAL | Status: DC
  Administered 2020-06-12 – 2020-06-13 (×2): 240 mg via ORAL
  Filled 2020-06-11 (×3): qty 1

## 2020-06-11 MED ORDER — IOHEXOL 300 MG/ML  SOLN
100.0000 mL | Freq: Once | INTRAMUSCULAR | Status: AC | PRN
  Administered 2020-06-11: 100 mL via INTRAVENOUS

## 2020-06-11 MED ORDER — HYDROCODONE-ACETAMINOPHEN 10-325 MG PO TABS: 1.0000 | ORAL_TABLET | Freq: Four times a day (QID) | ORAL | Status: DC | PRN

## 2020-06-11 MED ORDER — ALBUTEROL SULFATE (2.5 MG/3ML) 0.083% IN NEBU
2.5000 mg | INHALATION_SOLUTION | RESPIRATORY_TRACT | Status: DC | PRN
  Filled 2020-06-11: qty 3

## 2020-06-11 MED ORDER — ENOXAPARIN SODIUM 40 MG/0.4ML ~~LOC~~ SOLN
40.0000 mg | SUBCUTANEOUS | Status: DC
  Administered 2020-06-11 – 2020-06-12 (×2): 40 mg via SUBCUTANEOUS
  Filled 2020-06-11 (×2): qty 0.4

## 2020-06-11 MED ORDER — VANCOMYCIN HCL 1250 MG/250ML IV SOLN
1250.0000 mg | Freq: Two times a day (BID) | INTRAVENOUS | Status: DC
  Administered 2020-06-12 (×2): 1250 mg via INTRAVENOUS
  Filled 2020-06-11 (×4): qty 250

## 2020-06-11 MED ORDER — LOSARTAN POTASSIUM 50 MG PO TABS
100.0000 mg | ORAL_TABLET | Freq: Every day | ORAL | Status: DC
  Administered 2020-06-12 – 2020-06-13 (×2): 100 mg via ORAL
  Filled 2020-06-11 (×3): qty 2

## 2020-06-11 MED ORDER — SIMVASTATIN 20 MG PO TABS
10.0000 mg | ORAL_TABLET | Freq: Every day | ORAL | Status: DC
  Administered 2020-06-11 – 2020-06-12 (×2): 10 mg via ORAL
  Filled 2020-06-11 (×2): qty 1

## 2020-06-11 MED ORDER — HYDROCHLOROTHIAZIDE 12.5 MG PO CAPS
12.5000 mg | ORAL_CAPSULE | Freq: Every day | ORAL | Status: DC
  Administered 2020-06-12 – 2020-06-13 (×2): 12.5 mg via ORAL
  Filled 2020-06-11 (×3): qty 1

## 2020-06-11 MED ORDER — ONDANSETRON HCL 4 MG PO TABS: 4.0000 mg | ORAL_TABLET | Freq: Four times a day (QID) | ORAL | Status: DC | PRN

## 2020-06-11 MED ORDER — VANCOMYCIN HCL 2000 MG/400ML IV SOLN
2000.0000 mg | Freq: Once | INTRAVENOUS | Status: DC
  Filled 2020-06-11: qty 400

## 2020-06-11 NOTE — H&P (Signed)
History and Physical    [name] [id number] DOB: May 13, 1945 DOA: 06/11/2020  PCP: Gary Ochoa, NP  Patient coming from: [facility]  I have personally briefly reviewed patient's old medical records in [place]  Chief Complaint: cellulitis/left left pain and swelling on abx x 5 days with progressive symptoms  HPI: Gary SEILER Sr. is a 75 y.o. male with medical history significant of  DMII,GERD,HLD, HTN who presents to ed in referral from pcp for diagnosis of cellulitis with failure of out patient treatment. Per patient his symptoms started 27th of January. He states he scratched himself and broke skin he states after that he notes swelling redness and pain to left lower extremity. He states after a week of symptoms he followed up with pcp and was started on oral antibiotics.   He notes even while on antibiotics he noted continued redness, pain and swelling.Due this he was referred to ER.   ED Course:  Vitals:temp: 97.6, bp 185/99, hr 87, rr 16 sat 99% Labs: wbc:6, Hgb:15.2, plt 219 Na:139, K4.3, CL 100, Glu126, cr1.00 Lact 1.4 Left lower ext u/s: negative for dvt  Left lower extremity soft tissue u/s : negative for abscess Diffuse subcutaneous edema may represent cellulitis [id number] Review of Systems: As per HPI otherwise 10 point review of systems negative.   Past Medical History:  Diagnosis Date  . Diabetes mellitus without complication ([place])   . GERD (gastroesophageal reflux disease)   . Hyperlipidemia   . Hypertension     Past Surgical History:  Procedure Laterality Date  . CARDIAC CATHETERIZATION    . COLONOSCOPY WITH PROPOFOL N/A 01/24/2020   Procedure: COLONOSCOPY WITH PROPOFOL;  Surgeon: Gary Bellows, MD;  Location: [facility] ENDOSCOPY;  Service: Gastroenterology;  Laterality: N/A;  . CORONARY/GRAFT ACUTE MI REVASCULARIZATION N/A 05/02/2018   Procedure: Coronary/Graft Acute MI Revascularization;  Surgeon: Gary Kida, MD;  Location: [place] CV LAB;  Service: Cardiovascular;  Laterality: N/A;  . HERNIA REPAIR    . LEFT HEART CATH AND CORONARY ANGIOGRAPHY N/A 05/02/2018   Procedure: LEFT HEART CATH AND CORONARY ANGIOGRAPHY;  Surgeon: Gary Kida, MD;  Location: [place] [facility];  Service: Cardiovascular;  Laterality: N/A;  . PACEMAKER INSERTION Left 05/03/2018   Procedure: INSERTION PACEMAKER;  Surgeon: Gary Cowman, MD;  Location: [facility] ORS;  Service: Cardiovascular;  Laterality: Left;  . TEMPORARY PACEMAKER N/A 05/02/2018   Procedure: TEMPORARY PACEMAKER;  Surgeon: Gary Kida, MD;  Location: [place] [facility] LAB;  Service: Cardiovascular;  Laterality: N/A;  . TEMPORARY PACEMAKER Right 05/03/2018   Procedure: TEMPORARY PACEMAKER REMOVAL;  Surgeon: Gary Cowman, MD;  Location: [facility] ORS;  Service: Cardiovascular;  Laterality: Right;  . TONSILLECTOMY Bilateral    1992     reports that he has quit smoking. His smoking use included cigarettes. He has never used smokeless tobacco. He reports that he does not drink alcohol and does not use drugs.  No Known Allergies  Family History  Problem Relation Age of Onset  . Cirrhosis Father   . Bone cancer Mother    Prior to Admission medications   Medication Sig Start Date End Date Taking? Authorizing Provider  albuterol (VENTOLIN HFA) 108 (90 Base) MCG/ACT inhaler Inhale 1-2 puffs into the lungs every 4 (four) hours as needed for wheezing or shortness of breath. 03/06/19   Karen Kitchens, NP  allopurinol (ZYLOPRIM) 300 MG tablet TAKE 1 TABLET BY MOUTH AT BEDTIME FOR GOUT 06/04/20   Lavera Guise,  MD  amoxicillin-clavulanate (AUGMENTIN) 875-125 MG tablet Take 1 tablet by mouth 2 (two) times daily. Take with food 06/07/20   McDonough, Si Gaul, PA-C  aspirin EC 81 MG tablet Take 81 mg by mouth daily. Swallow whole.    [provider]  benzonatate (TESSALON) 200 MG capsule Take 1 capsule (200 mg total) by mouth 3 (three) times daily as needed  for cough. Patient not taking: Reported on 11/14/2019 03/06/19   Karen Kitchens, NP  colchicine 0.6 MG tablet TAKE 1 TAB BY MOUTH TWICE DAILY X 3 DAYS FOR GOUT FLARE UP. OTHERWISE ONCE DAILY FOR PREVENTION 11/03/19   Ronnell Freshwater, NP  diltiazem (CARTIA XT) 240 MG 24 hr capsule Take 1 capsule (240 mg total) by mouth daily. 12/02/18   Ronnell Freshwater, NP  esomeprazole (NEXIUM) 40 MG capsule TAKE 1 CAPSULE BY MOUTH DAILY AT 12 NOON. 03/13/20   Gary Bellows, MD  furosemide (LASIX) 20 MG tablet Take 1 tablet (20 mg total) by mouth daily for 3 days. 06/07/20 06/10/20  McDonough, Si Gaul, PA-C  HYDROcodone-acetaminophen (NORCO) 10-325 MG tablet Take 1 tablet by mouth every 8 (eight) hours as needed for up to 5 days. 06/07/20 06/12/20  McDonough, Si Gaul, PA-C  losartan-hydrochlorothiazide (HYZAAR) 100-12.5 MG tablet Take 1 tablet by mouth daily. 06/01/20   Lavera Guise, MD  metFORMIN (GLUCOPHAGE) 500 MG tablet Take 1 tablet (500 mg total) by mouth 2 (two) times daily with a meal. 06/23/19   Ronnell Freshwater, NP  simvastatin (ZOCOR) 10 MG tablet TAKE 1 TABLET BY MOUTH EVERY DAY 05/29/20   Lavera Guise, MD  sucralfate (CARAFATE) 1 g tablet Take 1 tablet (1 g total) by mouth 4 (four) times daily. Patient taking differently: Take 1 g by mouth 2 (two) times daily.  05/08/18 05/08/19  Earleen Newport, MD  tamsulosin (FLOMAX) 0.4 MG CAPS capsule TAKE 1 CAPSULE (0.4 MG TOTAL) BY MOUTH DAILY. 05/27/20   Lavera Guise, MD  triamcinolone (KENALOG) 0.025 % cream Apply 1 application topically 2 (two) times daily. Patient not taking: Reported on 11/28/2019 12/02/18   Ronnell Freshwater, NP    Physical Exam: Vitals:   06/11/20 1303 06/11/20 1310 06/11/20 1714  BP:  (!) 167/98 (!) 164/94  Pulse: 81  82  Resp: 18  18  Temp: 97.8 F (36.6 C)  98 F (36.7 C)  TempSrc: Oral    SpO2: 99%  99%  Weight:  107 kg      Vitals:   06/11/20 1303 06/11/20 1310 06/11/20 1714  BP:  (!) 167/98 (!) 164/94  Pulse: 81  82  Resp: 18   18  Temp: 97.8 F (36.6 C)  98 F (36.7 C)  TempSrc: Oral    SpO2: 99%  99%  Weight:  107 kg   Constitutional: NAD, calm, comfortable Eyes: PERRL, lids and conjunctivae normal ENMT: Mucous membranes are moist. Posterior pharynx clear of any exudate or lesions.Normal dentition.  Neck: normal, supple, no masses, no thyromegaly Respiratory: clear to auscultation bilaterally, no wheezing, no crackles. Normal respiratory effort. No accessory muscle use.  Cardiovascular: Regular rate and rhythm, no murmurs / rubs / gallops. + swelling left extremity  2+ pedal pulses. Abdomen: no tenderness, no masses palpated. No hepatosplenomegaly. Bowel sounds positive.  Musculoskeletal: no clubbing / cyanosis. No joint deformity upper and lower extremities. Good ROM, no contractures. Normal muscle tone.  Skin: notes swelling and induration around abrasion mid calf on left leg. Lower left leg  with swelling below need to foot. Noted to be warm and tender to touch Neurologic: CN 2-12 grossly intact. Sensation intact, Strength 5/5 in all 4.  Psychiatric: Normal judgment and insight. Alert and oriented x 3. Normal mood.    Labs on Admission: I have personally reviewed following labs and imaging studies  CBC: Recent Labs  Lab 06/11/20 1313  WBC 6.0  NEUTROABS 3.9  HGB 15.2  HCT 47.5  MCV 83.0  PLT 638   Basic Metabolic Panel: Recent Labs  Lab 06/11/20 1313  NA 139  K 4.3  CL 100  CO2 27  GLUCOSE 126*  BUN 22  CREATININE 1.00  CALCIUM 9.7   GFR: Estimated Creatinine Clearance: 85 mL/min (by C-G formula based on SCr of 1 mg/dL). Liver Function Tests: Recent Labs  Lab 06/11/20 1313  AST 16  ALT 19  ALKPHOS 78  BILITOT 0.7  PROT 7.5  ALBUMIN 4.0   No results for input(s): LIPASE, AMYLASE in the last 168 hours. No results for input(s): AMMONIA in the last 168 hours. Coagulation Profile: No results for input(s): INR, PROTIME in the last 168 hours. Cardiac Enzymes: No results for  input(s): CKTOTAL, CKMB, CKMBINDEX, TROPONINI in the last 168 hours. BNP (last 3 results) No results for input(s): PROBNP in the last 8760 hours. HbA1C: No results for input(s): HGBA1C in the last 72 hours. CBG: No results for input(s): GLUCAP in the last 168 hours. Lipid Profile: No results for input(s): CHOL, HDL, LDLCALC, TRIG, CHOLHDL, LDLDIRECT in the last 72 hours. Thyroid Function Tests: No results for input(s): TSH, T4TOTAL, FREET4, T3FREE, THYROIDAB in the last 72 hours. Anemia Panel: No results for input(s): VITAMINB12, FOLATE, FERRITIN, TIBC, IRON, RETICCTPCT in the last 72 hours. Urine analysis:    Component Value Date/Time   APPEARANCEUR Clear 06/21/2018 0953   GLUCOSEU Negative 06/21/2018 0953   BILIRUBINUR Negative 06/21/2018 0953   PROTEINUR Negative 06/21/2018 0953   NITRITE Negative 06/21/2018 0953   LEUKOCYTESUR Negative 06/21/2018 0953    Radiological Exams on Admission: US Venous Img Lower Unilateral Left  Result Date: 06/11/2020 CLINICAL DATA:  75 year old male with left lower extremity pain and swelling. EXAM: Left LOWER EXTREMITY VENOUS DOPPLER ULTRASOUND TECHNIQUE: Gray-scale sonography with compression, as well as color and duplex ultrasound, were performed to evaluate the deep venous system(s) from the level of the common femoral vein through the popliteal and proximal calf veins. COMPARISON:  None. FINDINGS: VENOUS Normal compressibility of the common femoral, superficial femoral, and popliteal veins, as well as the visualized calf veins. Visualized portions of profunda femoral vein and great saphenous vein unremarkable. No filling defects to suggest DVT on grayscale or color Doppler imaging. Doppler waveforms show normal direction of venous flow, normal respiratory plasticity and response to augmentation. Limited views of the contralateral common femoral vein are unremarkable. OTHER There is diffuse subcutaneous edema of the calf. Limitations: none IMPRESSION:  No DVT in the left lower extremity. Electronically Signed   By: Anner Crete M.D.   On: 06/11/2020 17:34   [place] LT LOWER EXTREM LTD SOFT TISSUE NON VASCULAR  Result Date: 06/11/2020 CLINICAL DATA:  75 year old male with left lower extremity pain and swelling. EXAM: ULTRASOUND left LOWER EXTREMITY LIMITED TECHNIQUE: Ultrasound examination of the lower extremity soft tissues was performed in the area of clinical concern. COMPARISON:  None. FINDINGS: There is diffuse subcutaneous edema of the left lower extremity with mild hyperemia which may represent cellulitis. No drainable fluid collection. IMPRESSION: Diffuse subcutaneous edema may represent cellulitis.  Electronically Signed   By: Anner Crete M.D.   On: 06/11/2020 17:36    EKG: Independently reviewed. n/a  Assessment/Plan Complicated soft tissue infection of left lower extremity  -failure out patient treatment / 5 days of augmentin  -negative soft tissue u/s -not noted abscess  -negative venous doppler for dvt  -f/u on culture data , inflammatory markers -broad spectrum abx per protocol -mrsa pcr ordered -would care consult for assistance    DMII -stable fs  -last  a1c 6.6  -monitor fs  -hold metformin while inhouse  GERD -ppi   HLD -continue statin    HTN Continue diltiazem,lasix,losartan/hctz  Gout -continue allopurinol   DVT prophylaxis: lovenox Code Status:  Full Family Communication: family at bedside [name], [name] (Daughter)  [phone] ([place]) Disposition Plan:patient  expected to be admitted greater than 2 midnights Consults called: n/a Admission status:inpatient    Clance Boll MD [facility]  If 7PM-7AM, please contact night-coverage [email or web address] Password [facility]  06/11/2020, 7:31 PM

## 2020-06-11 NOTE — ED Provider Notes (Signed)
[facility] Emergency Department Provider Note  Time seen: 4:00 PM  I have reviewed the triage vital signs and the nursing notes.   HISTORY  Chief Complaint Leg Swelling and Wound Check   HPI Gary PIKER Sr. is a 75 y.o. male with a past medical history of diabetes, gastric reflux, hypertension, hyperlipidemia presents to the emergency department for left leg pain and swelling.  According to the patient for the past week or so he has been experiencing left leg pain and swelling initially just redness to the left posterior calf which is now enlarged in size and become more tender and swollen with mild drainage.  Patient has been seeing his primary care doctor for the same has been on antibiotics for the past 5 days.  Was seen today for recheck and was sent to the emergency department for worsening infection despite outpatient treatment.  No reported fever.  Largely negative review of systems otherwise.   Past Medical History:  Diagnosis Date  . Diabetes mellitus without complication (H[facility])   . GERD (gastroesophageal reflux disease)   . Hyperlipidemia   . Hypertension     Patient Active Problem List   Diagnosis Date Noted  . Screening for colon cancer 11/09/2019  . Sebaceous cyst of ear 11/09/2019  . Encounter for general adult medical examination with abnormal findings 06/23/2019  . Gout 06/23/2019  . Other fatigue 05/14/2019  . Type 2 diabetes mellitus with hyperglycemia (HCC) 12/13/2018  . Atopic dermatitis 12/13/2018  . Coronary artery disease involving native heart 05/23/2018  . Right upper quadrant abdominal pain 05/23/2018  . Bradycardia 04/30/2018  . Hypertension 05/18/2017  . Gastroesophageal reflux disease without esophagitis 05/18/2017  . Prostate cancer (HCC) 06/26/2016  . Anejaculation 10/18/2012  . Erectile dysfunction following radiation therapy 10/18/2012  . Microscopic hematuria 04/19/2012  . Testicular hypofunction 04/19/2012    Past  Surgical History:  Procedure Laterality Date  . CARDIAC CATHETERIZATION    . COLONOSCOPY WITH PROPOFOL N/A 01/24/2020   Procedure: COLONOSCOPY WITH PROPOFOL;  Surgeon: Wyline Mood, MD;  Location: [facility] ENDOSCOPY;  Service: Gastroenterology;  Laterality: N/A;  . CORONARY/GRAFT ACUTE MI REVASCULARIZATION N/A 05/02/2018   Procedure: Coronary/Graft Acute MI Revascularization;  Surgeon: Alwyn Pea, MD;  Location: [facility] INVASIVE CV LAB;  Service: Cardiovascular;  Laterality: N/A;  . HERNIA REPAIR    . LEFT HEART CATH AND CORONARY ANGIOGRAPHY N/A 05/02/2018   Procedure: LEFT HEART CATH AND CORONARY ANGIOGRAPHY;  Surgeon: Alwyn Pea, MD;  Location: [facility] INVASIVE CV LAB;  Service: Cardiovascular;  Laterality: N/A;  . PACEMAKER INSERTION Left 05/03/2018   Procedure: INSERTION PACEMAKER;  Surgeon: Marcina Millard, MD;  Location: [facility] ORS;  Service: Cardiovascular;  Laterality: Left;  . TEMPORARY PACEMAKER N/A 05/02/2018   Procedure: TEMPORARY PACEMAKER;  Surgeon: Alwyn Pea, MD;  Location: [facility] INVASIVE CV LAB;  Service: Cardiovascular;  Laterality: N/A;  . TEMPORARY PACEMAKER Right 05/03/2018   Procedure: TEMPORARY PACEMAKER REMOVAL;  Surgeon: Marcina Millard, MD;  Location: [facility] ORS;  Service: Cardiovascular;  Laterality: Right;  . TONSILLECTOMY Bilateral    1992    Prior to Admission medications   Medication Sig Start Date End Date Taking? Authorizing Provider  albuterol (VENTOLIN HFA) 108 (90 Base) MCG/ACT inhaler Inhale 1-2 puffs into the lungs every 4 (four) hours as needed for wheezing or shortness of breath. 03/06/19   Verlee Monte, NP  allopurinol (ZYLOPRIM) 300 MG tablet TAKE 1 TABLET BY MOUTH AT BEDTIME FOR GOUT 06/04/20   Welton Flakes,  Timoteo Gaul, MD  amoxicillin-clavulanate (AUGMENTIN) 875-125 MG tablet Take 1 tablet by mouth 2 (two) times daily. Take with food 06/07/20   McDonough, Si Gaul, PA-C  aspirin EC 81 MG tablet Take 81 mg by mouth daily. Swallow whole.     [provider]  benzonatate (TESSALON) 200 MG capsule Take 1 capsule (200 mg total) by mouth 3 (three) times daily as needed for cough. Patient not taking: Reported on 11/14/2019 03/06/19   Karen Kitchens, NP  colchicine 0.6 MG tablet TAKE 1 TAB BY MOUTH TWICE DAILY X 3 DAYS FOR GOUT FLARE UP. OTHERWISE ONCE DAILY FOR PREVENTION 11/03/19   Ronnell Freshwater, NP  diltiazem (CARTIA XT) 240 MG 24 hr capsule Take 1 capsule (240 mg total) by mouth daily. 12/02/18   Ronnell Freshwater, NP  esomeprazole (NEXIUM) 40 MG capsule TAKE 1 CAPSULE BY MOUTH DAILY AT 12 NOON. 03/13/20   [name], MD  furosemide (LASIX) 20 MG tablet Take 1 tablet (20 mg total) by mouth daily for 3 days. 06/07/20 06/10/20  McDonough, Si Gaul, PA-C  HYDROcodone-acetaminophen (NORCO) 10-325 MG tablet Take 1 tablet by mouth every 8 (eight) hours as needed for up to 5 days. 06/07/20 06/12/20  McDonough, Si Gaul, PA-C  losartan-hydrochlorothiazide (HYZAAR) 100-12.5 MG tablet Take 1 tablet by mouth daily. 06/01/20   Lavera Guise, MD  metFORMIN (GLUCOPHAGE) 500 MG tablet Take 1 tablet (500 mg total) by mouth 2 (two) times daily with a meal. 06/23/19   Ronnell Freshwater, NP  simvastatin (ZOCOR) 10 MG tablet TAKE 1 TABLET BY MOUTH EVERY DAY 05/29/20   Lavera Guise, MD  sucralfate (CARAFATE) 1 g tablet Take 1 tablet (1 g total) by mouth 4 (four) times daily. Patient taking differently: Take 1 g by mouth 2 (two) times daily.  05/08/18 05/08/19  Earleen Newport, MD  tamsulosin (FLOMAX) 0.4 MG CAPS capsule TAKE 1 CAPSULE (0.4 MG TOTAL) BY MOUTH DAILY. 05/27/20   Lavera Guise, MD  triamcinolone (KENALOG) 0.025 % cream Apply 1 application topically 2 (two) times daily. Patient not taking: Reported on 11/28/2019 12/02/18   Ronnell Freshwater, NP    No Known Allergies  Family History  Problem Relation Age of Onset  . Cirrhosis Father   . Bone cancer Mother     Social History Social History   Tobacco Use  . Smoking status: Former Smoker     Types: Cigarettes  . Smokeless tobacco: Never Used  Vaping Use  . Vaping Use: Never used  Substance Use Topics  . Alcohol use: No  . Drug use: No    Review of Systems Constitutional: Negative for fever. Cardiovascular: Negative for chest pain. Respiratory: Negative for shortness of breath. Gastrointestinal: Negative for abdominal pain, vomiting Musculoskeletal: Left leg pain and swelling and tenderness Skin: Area of tenderness swelling and drainage to left posterior calf Neurological: Negative for headache All other ROS negative  ____________________________________________   PHYSICAL EXAM:  VITAL SIGNS: ED Triage Vitals  Enc Vitals Group     BP 06/11/20 1310 (!) 167/98     Pulse Rate 06/11/20 1303 81     Resp 06/11/20 1303 18     Temp 06/11/20 1303 97.8 F (36.6 C)     Temp Source 06/11/20 1303 Oral     SpO2 06/11/20 1303 99 %     Weight 06/11/20 1310 235 lb 14.3 oz (107 kg)     Height --      Head Circumference --  Peak Flow --      Pain Score --      Pain Loc --      Pain Edu? --      Excl. in [place]? --     Constitutional: Alert and oriented. Well appearing and in no distress. Eyes: Normal exam ENT      Head: Normocephalic and atraumatic.      Mouth/Throat: Mucous membranes are moist. Cardiovascular: Normal rate, regular rhythm.  Respiratory: Normal respiratory effort without tachypnea nor retractions. Breath sounds are clear and equal bilaterally. Gastrointestinal: Soft and nontender. No distention.   Musculoskeletal: Mild pedal edema to right lower extremity, 1+ lower extremity edema to the level of the left knee.  Patient has tenderness throughout the left lower extremity below the knee warm to touch.  Patient has an area approximately 10 cm in diameter to the posterior aspect of the left calf consistent with cellulitis/induration with a central area of drainage possibly a small abscess. Neurologic:  Normal speech and language. No gross focal neurologic  deficits  Skin:  Skin is warm, left lower extremity changes as described above. Psychiatric: Mood and affect are normal.   ____________________________________________   RADIOLOGY  Ultrasound negative for DVT. Ultrasound soft tissue consistent with cellulitis.  ____________________________________________   INITIAL IMPRESSION / ASSESSMENT AND PLAN / ED COURSE  Pertinent labs & imaging results that were available during my care of the patient were reviewed by me and considered in my medical decision making (see chart for details).   Patient presents emergency department for left lower extremity redness tenderness and swelling that has worsened despite outpatient therapy.  The area of the leg is swollen considerably more than the right lower extremity suspect cellulitis possibly a small abscess.  We will obtain an ultrasound to evaluate for abscess as well as an ultrasound to rule out lower extremity DVT.  Reassuringly patient's lab work is normal including white blood cell count and lactic acid.  However given worsening infection despite outpatient treatment patient may require IV antibiotics.  We will start the patient on IV antibiotics in the emergency department send blood cultures and obtain ultrasound to further evaluate.  Ultrasound shows diffuse soft tissue edema consistent with cellulitis.  No sign of DVT.  Given the patient's failure of outpatient therapy we will start the patient on IV vancomycin and admit to the hospital service for further treatment.  Gary Alar Sr. was evaluated in Emergency Department on 06/11/2020 for the symptoms described in the history of present illness. He was evaluated in the context of the global COVID-19 pandemic, which necessitated consideration that the patient might be at risk for infection with the SARS-CoV-2 virus that causes COVID-19. Institutional protocols and algorithms that pertain to the evaluation of patients at risk for COVID-19 are in a  state of rapid change based on information released by regulatory bodies including the CDC and federal and state organizations. These policies and algorithms were followed during the patient's care in the ED.  ____________________________________________   FINAL CLINICAL IMPRESSION(S) / ED DIAGNOSES  Cellulitis   Harvest Dark, MD 06/11/20 [phone]

## 2020-06-11 NOTE — Progress Notes (Signed)
[facility], [place] [address]  Internal MEDICINE  Office Visit Note  Patient Name: [name]  [id number]  [id number]  Date of Service: 06/12/2020  Chief Complaint  Patient presents with  . Follow-up  . Diabetes  . Gastroesophageal Reflux  . Hyperlipidemia  . Hypertension  . Leg Pain    Has gotten worse its all in his left foot     HPI Pt is here for a sick visit. L leg is worse. He did lasix for 3 days, but it did not help swelling. Swelling in both feet now. He took Norco as needed for pain which helped but also made him a little out of it and he could only take it while at home with no work to be done, otherwise he took tylenol with min relief. He has also been taking Augmentin BID since visit on Thursday. His leg looks much worse though and will need IV antibiotics. He is taking allopurinol daily. No colchicine currently. He is due for lab work.  Current Medication:  No facility-administered encounter medications on file as of 06/11/2020.   Outpatient Encounter Medications as of 06/11/2020  Medication Sig  . albuterol (VENTOLIN HFA) 108 (90 Base) MCG/ACT inhaler Inhale 1-2 puffs into the lungs every 4 (four) hours as needed for wheezing or shortness of breath.  . allopurinol (ZYLOPRIM) 300 MG tablet TAKE 1 TABLET BY MOUTH AT BEDTIME FOR GOUT  . amoxicillin-clavulanate (AUGMENTIN) 875-125 MG tablet Take 1 tablet by mouth 2 (two) times daily. Take with food  . aspirin EC 81 MG tablet Take 81 mg by mouth daily. Swallow whole.  . benzonatate (TESSALON) 200 MG capsule Take 1 capsule (200 mg total) by mouth 3 (three) times daily as needed for cough. (Patient not taking: Reported on 11/14/2019)  . colchicine 0.6 MG tablet TAKE 1 TAB BY MOUTH TWICE DAILY X 3 DAYS FOR GOUT FLARE UP. OTHERWISE ONCE DAILY FOR PREVENTION  . diltiazem (CARTIA XT) 240 MG 24 hr capsule Take 1 capsule (240 mg total) by mouth daily.  Marland Kitchen esomeprazole (NEXIUM) 40 MG  capsule TAKE 1 CAPSULE BY MOUTH DAILY AT 12 NOON.  . furosemide (LASIX) 20 MG tablet Take 1 tablet (20 mg total) by mouth daily for 3 days.  Marland Kitchen HYDROcodone-acetaminophen (NORCO) 10-325 MG tablet Take 1 tablet by mouth every 8 (eight) hours as needed for up to 5 days.  Marland Kitchen losartan-hydrochlorothiazide (HYZAAR) 100-12.5 MG tablet Take 1 tablet by mouth daily.  . metFORMIN (GLUCOPHAGE) 500 MG tablet Take 1 tablet (500 mg total) by mouth 2 (two) times daily with a meal.  . simvastatin (ZOCOR) 10 MG tablet TAKE 1 TABLET BY MOUTH EVERY DAY  . sucralfate (CARAFATE) 1 g tablet Take 1 tablet (1 g total) by mouth 4 (four) times daily. (Patient taking differently: Take 1 g by mouth 2 (two) times daily. )  . tamsulosin (FLOMAX) 0.4 MG CAPS capsule TAKE 1 CAPSULE (0.4 MG TOTAL) BY MOUTH DAILY.  Marland Kitchen triamcinolone (KENALOG) 0.025 % cream Apply 1 application topically 2 (two) times daily. (Patient not taking: Reported on 11/28/2019)      Medical History: Past Medical History:  Diagnosis Date  . Diabetes mellitus without complication ([place])   . GERD (gastroesophageal reflux disease)   . Hyperlipidemia   . Hypertension      Vital Signs: BP (!) 185/99   Pulse 87   Temp 97.6 F (36.4 C)   Resp 16   Ht 6' 3.5" (1.918  m)   Wt 235 lb 12.8 oz (107 kg)   SpO2 99%   BMI 29.08 kg/m    Review of Systems  Constitutional: Negative for fatigue and fever.  HENT: Negative for congestion, mouth sores and postnasal drip.   Eyes: Negative for visual disturbance.  Respiratory: Negative for cough.   Cardiovascular: Positive for leg swelling. Negative for chest pain.  Genitourinary: Negative for flank pain.  Musculoskeletal: Positive for joint swelling and myalgias.  Skin: Positive for wound.       LLE is swollen, red, and has partially scabbed wound; acutely painful but able to ambulate still  Neurological: Negative for headaches.  Psychiatric/Behavioral: Negative.     Physical Exam Constitutional:       General: He is not in acute distress.    Appearance: He is well-developed. He is not diaphoretic.  HENT:     Head: Normocephalic and atraumatic.     Mouth/Throat:     Pharynx: No oropharyngeal exudate.  Eyes:     Pupils: Pupils are equal, round, and reactive to light.  Neck:     Thyroid: No thyromegaly.     Vascular: No JVD.     Trachea: No tracheal deviation.  Cardiovascular:     Rate and Rhythm: Normal rate and regular rhythm.     Heart sounds: Normal heart sounds. No murmur heard. No friction rub. No gallop.   Pulmonary:     Effort: Pulmonary effort is normal. No respiratory distress.     Breath sounds: No wheezing or rales.  Chest:     Chest wall: No tenderness.  Abdominal:     General: Bowel sounds are normal.     Palpations: Abdomen is soft.  Musculoskeletal:        General: Normal range of motion.     Cervical back: Normal range of motion and neck supple.     Right lower leg: Edema present.     Left lower leg: Edema present.  Lymphadenopathy:     Cervical: No cervical adenopathy.  Skin:    General: Skin is warm and dry.     Findings: Erythema and lesion present.     Comments: Erythematous, edematous LLE with partially scabbed wound on back of calf worsening from 4 days ago with concern for underlying DVT  Neurological:     Mental Status: He is alert and oriented to person, place, and time.     Cranial Nerves: No cranial nerve deficit.  Psychiatric:        Behavior: Behavior normal.        Thought Content: Thought content normal.        Judgment: Judgment normal.       Assessment/Plan: 1. Cellulitis and abscess of left lower extremity Wound is worsening despite PO Augmentin, Lasix, and wound cleaning. He will use tylenol and Norco for pain as needed. Sent to ED for IV antibiotic and to evaluate circulation and concern for possible underlying DVT or possible gout flare.  2. Essential hypertension Significantly elevated in office today, likely secondary to pain  and acute infection. He has been taking Diltiazem and Hyzaar daily plus 20mg  lasix the past 3 days for LE edema. He was sent to the ED for further management.   3. Gout Continue Allopurinol daily for prevention. Will need to recheck routine lab work pending acute injury evaluation.  4. Type II Diabetes Continue Metformin 500mg  BID. A1c last visit was 6.6. Will continue to monitor.   General Counseling: martial statt understanding of  the findings of todays visit and agrees with plan of treatment. I have discussed any further diagnostic evaluation that may be needed or ordered today. We also reviewed his medications today. he has been encouraged to call the office with any questions or concerns that should arise related to todays visit.    Counseling: Hypertension Counseling:   The following hypertensive lifestyle modification were recommended and discussed:  1. Limiting alcohol intake to less than 1 oz/day of ethanol:(24 oz of beer or 8 oz of wine or 2 oz of 100-proof whiskey). 2. Take baby ASA 81 mg daily. 3. Importance of regular aerobic exercise and losing weight. 4. Reduce dietary saturated fat and cholesterol intake for overall cardiovascular health. 5. Maintaining adequate dietary potassium, calcium, and magnesium intake. 6. Regular monitoring of the blood pressure. 7. Reduce sodium intake to less than 100 mmol/day (less than 2.3 gm of sodium or less than 6 gm of sodium choride)    No orders of the defined types were placed in this encounter.   No orders of the defined types were placed in this encounter.   Time spent:15 Minutes

## 2020-06-11 NOTE — Progress Notes (Signed)
Pharmacy Antibiotic Note  Gary KOLLMANN Sr. is a 75 y.o. male admitted on 06/11/2020 with cellulitis. Patient with a past medical history of diabetes, gastric reflux, hypertension, hyperlipidemia presents to the emergency department for left leg pain and swelling. Pharmacy has been consulted for vancomycin dosing.  Plan: Will give Vancomycin 2g IV x1 loading dose followed by 1250mg  IV q12h -est AUC: 479 -est pk: 29.2 mcg/ml -est tr: 14.3 mcg/ml -Monitor renal function and adjust dose as clinically indicated -Obtain vanc levels prior to 4th or 5th dose if vancomycin continued  Weight: 107 kg (235 lb 14.3 oz)  Temp (24hrs), Avg:97.8 F (36.6 C), Min:97.6 F (36.4 C), Max:98 F (36.7 C)  Recent Labs  Lab 06/11/20 1313  WBC 6.0  CREATININE 1.00  LATICACIDVEN 1.4    Estimated Creatinine Clearance: 85 mL/min (by C-G formula based on SCr of 1 mg/dL).    No Known Allergies  Antimicrobials this admission: 2/7 Vancomycin >>   Microbiology results: 2/7 BCx: pending  Thank you for allowing pharmacy to be a part of this patient's care.  Sherilyn Banker, PharmD Pharmacy Resident  06/11/2020 8:19 PM

## 2020-06-11 NOTE — ED Notes (Signed)
Lactic acid cancelled per Dr. Kerman Passey due to normal first result.

## 2020-06-11 NOTE — ED Notes (Signed)
Attempted IV stick x2, difficult stick. IV team consult placed.

## 2020-06-11 NOTE — ED Triage Notes (Signed)
Pt sent from Dr. Marella Bile office, for concern over LLE redness and swelling. Would present to L calf, about quarter-sized. Pt states he has been on abx x 5 days w/worsening symptoms. Denies any sob

## 2020-06-12 DIAGNOSIS — L03116 Cellulitis of left lower limb: Secondary | ICD-10-CM | POA: Diagnosis not present

## 2020-06-12 LAB — COMPREHENSIVE METABOLIC PANEL
ALT: 18 U/L (ref 0–44)
AST: 16 U/L (ref 15–41)
Albumin: 3.3 g/dL — ABNORMAL LOW (ref 3.5–5.0)
Alkaline Phosphatase: 73 U/L (ref 38–126)
Anion gap: 12 (ref 5–15)
BUN: 20 mg/dL (ref 8–23)
CO2: 26 mmol/L (ref 22–32)
Calcium: 9.7 mg/dL (ref 8.9–10.3)
Chloride: 97 mmol/L — ABNORMAL LOW (ref 98–111)
Creatinine, Ser: 0.82 mg/dL (ref 0.61–1.24)
GFR, Estimated: >60 mL/min (ref >60)
Glucose, Bld: 180 mg/dL — ABNORMAL HIGH (ref 70–99)
Potassium: 3.6 mmol/L (ref 3.5–5.1)
Sodium: 135 mmol/L (ref 135–145)
Total Bilirubin: 0.8 mg/dL (ref 0.3–1.2)
Total Protein: 6.5 g/dL (ref 6.5–8.1)

## 2020-06-12 LAB — CBC
HCT: 45.5 % (ref 39.0–52.0)
Hemoglobin: 15.4 g/dL (ref 13.0–17.0)
MCH: 27.5 pg (ref 26.0–34.0)
MCHC: 33.8 g/dL (ref 30.0–36.0)
MCV: 81.3 fL (ref 80.0–100.0)
Platelets: 203 10*3/uL (ref 150–400)
RBC: 5.6 MIL/uL (ref 4.22–5.81)
RDW: 14.1 % (ref 11.5–15.5)
WBC: 5.9 10*3/uL (ref 4.0–10.5)
nRBC: 0 % (ref 0.0–0.2)

## 2020-06-12 LAB — GLUCOSE, CAPILLARY
Glucose-Capillary: 163 mg/dL — ABNORMAL HIGH (ref 70–99)
Glucose-Capillary: 177 mg/dL — ABNORMAL HIGH (ref 70–99)
Glucose-Capillary: 123 mg/dL — ABNORMAL HIGH (ref 70–99)
Glucose-Capillary: 136 mg/dL — ABNORMAL HIGH (ref 70–99)

## 2020-06-12 LAB — SARS CORONAVIRUS 2 BY RT PCR (HOSPITAL ORDER, PERFORMED IN ~~LOC~~ HOSPITAL LAB): SARS Coronavirus 2: NEGATIVE

## 2020-06-12 LAB — URINALYSIS, ROUTINE W REFLEX MICROSCOPIC
Bilirubin Urine: NEGATIVE
Glucose, UA: NEGATIVE mg/dL
Hgb urine dipstick: NEGATIVE
Ketones, ur: NEGATIVE mg/dL
Leukocytes,Ua: NEGATIVE
Nitrite: NEGATIVE
Protein, ur: NEGATIVE mg/dL
Specific Gravity, Urine: 1.03 (ref 1.005–1.030)
pH: 6 (ref 5.0–8.0)

## 2020-06-12 LAB — MRSA PCR SCREENING: MRSA by PCR: NEGATIVE

## 2020-06-12 LAB — PROTIME-INR
INR: 1.1 (ref 0.8–1.2)
Prothrombin Time: 13.7 seconds (ref 11.4–15.2)

## 2020-06-12 LAB — PROCALCITONIN: Procalcitonin: <0.1 ng/mL

## 2020-06-12 LAB — CBG MONITORING, ED: Glucose-Capillary: 185 mg/dL — ABNORMAL HIGH (ref 70–99)

## 2020-06-12 LAB — C-REACTIVE PROTEIN: CRP: 2.5 mg/dL — ABNORMAL HIGH (ref <1.0)

## 2020-06-12 MED ORDER — SODIUM CHLORIDE 0.9 % IV SOLN
INTRAVENOUS | Status: DC | PRN
  Administered 2020-06-12: 500 mL via INTRAVENOUS

## 2020-06-12 NOTE — Progress Notes (Signed)
Patient arrived on unit.

## 2020-06-12 NOTE — Progress Notes (Signed)
PROGRESS NOTE    [name]  SNK:[id number] DOB: 1945-08-28 DOA: 06/11/2020 PCP: Luiz Ochoa, NP    Assessment & Plan:   Active Problems:   Cellulitis   Gary PERINE Sr. is a 75 y.o. male with medical history significant of  DMII,GERD,HLD, HTN who presents to ed in referral from pcp for diagnosis of cellulitis with failure of out patient treatment. Per patient his symptoms started 27th of January. He states he scratched himself and broke skin he states after that he notes swelling redness and pain to left lower extremity. He states after a week of symptoms he followed up with pcp and was started on oral antibiotics.   He notes even while on antibiotics he noted continued redness, pain and swelling.  Due this he was referred to ER.    Cellulitis of left lower leg --failed outpatient treatment pf 5 days of augmentin.  Pain, swelling and erythema over left calf area. -negative soft tissue u/s -not noted abscess  -negative venous doppler for dvt  --started on IV vanc with much improvement  Plan: --cont IV vanc --May de-escalate to oral abx Keflex and doxy to finish a 7-10 day course  DMII, well controlled -last  a1c 6.6  --Hold home metformin while inpatient --no need for SSI, d/c fingersticks.  GERD --cont home PPI  HLD -continue statin    HTN --cont home diltiazem, losartan/HCTZ  Gout -continue allopurinol    DVT prophylaxis: Lovenox SQ Code Status: Full code  Family Communication: wife updated at bedside today Status is: inpatient Dispo:   The patient is from: home Anticipated d/c is to: home Anticipated d/c date is: 1-2 days Patient currently is not medically ready to d/c due to: still on IV vanc for cellulitis   Subjective and Interval History:  Pt reported pain, swelling and redness much improved in his leg today.  No other issues.  Normal oral intake.     Objective: Vitals:   06/12/20 0738 06/12/20 1141 06/12/20 1546 06/12/20 2017  BP:  (!) 146/92 (!) 144/79 (!) 148/92 (!) 127/59  Pulse: 61 60 61 60  Resp: 14 18 16 16   Temp: 98.1 F (36.7 C) 98.6 F (37 C) 97.8 F (36.6 C) 98.8 F (37.1 C)  TempSrc:  Oral  Oral  SpO2: 99% 99% 100% 99%  Weight:        Intake/Output Summary (Last 24 hours) at 06/12/2020 2031 Last data filed at 06/12/2020 0039 Gross per 24 hour  Intake --  Output 1 ml  Net -1 ml   Filed Weights   06/11/20 1310  Weight: 107 kg    Examination:   Constitutional: NAD, AAOx3 HEENT: conjunctivae and lids normal, EOMI CV: No cyanosis.   RESP: normal respiratory effort, on RA Extremities: No effusions, edema in BLE SKIN: warm, dry Neuro: II - XII grossly intact.   Psych: Normal mood and affect.  Appropriate judgement and reason   Data Reviewed: I have personally reviewed following labs and imaging studies  CBC: Recent Labs  Lab 06/11/20 1313 06/11/20 2023 06/12/20 0444  WBC 6.0 6.3 5.9  NEUTROABS 3.9  --   --   HGB 15.2 14.9 15.4  HCT 47.5 44.9 45.5  MCV 83.0 81.2 81.3  PLT 219 203 937   Basic Metabolic Panel: Recent Labs  Lab 06/11/20 1313 06/11/20 2023 06/12/20 0444  NA 139  --  135  K 4.3  --  3.6  CL 100  --  97*  CO2  27  --  26  GLUCOSE 126*  --  180*  BUN 22  --  20  CREATININE 1.00 0.83 0.82  CALCIUM 9.7  --  9.7   GFR: Estimated Creatinine Clearance: 103.7 mL/min (by C-G formula based on SCr of 0.82 mg/dL). Liver Function Tests: Recent Labs  Lab 06/11/20 1313 06/12/20 0444  AST 16 16  ALT 19 18  ALKPHOS 78 73  BILITOT 0.7 0.8  PROT 7.5 6.5  ALBUMIN 4.0 3.3*   No results for input(s): LIPASE, AMYLASE in the last 168 hours. No results for input(s): AMMONIA in the last 168 hours. Coagulation Profile: Recent Labs  Lab 06/12/20 0444  INR 1.1   Cardiac Enzymes: Recent Labs  Lab 06/11/20 2023  CKTOTAL 70   BNP (last 3 results) No results for input(s): PROBNP in the last 8760 hours. HbA1C: No results for input(s): HGBA1C in the last 72  hours. CBG: Recent Labs  Lab 06/12/20 0121 06/12/20 0742 06/12/20 1147 06/12/20 1631  GLUCAP 185* 163* 177* 123*   Lipid Profile: No results for input(s): CHOL, HDL, LDLCALC, TRIG, CHOLHDL, LDLDIRECT in the last 72 hours. Thyroid Function Tests: Recent Labs    06/11/20 2023  TSH 0.714   Anemia Panel: No results for input(s): VITAMINB12, FOLATE, FERRITIN, TIBC, IRON, RETICCTPCT in the last 72 hours. Sepsis Labs: Recent Labs  Lab 06/11/20 1313 06/11/20 2023 06/12/20 0444  PROCALCITON  --  <0.10 <0.10  LATICACIDVEN 1.4 1.6  --     Recent Results (from the past 240 hour(s))  Blood culture (routine x 2)     Status: None (Preliminary result)   Collection Time: 06/11/20  8:09 PM   Specimen: BLOOD  Result Value Ref Range Status   Specimen Description BLOOD BLOOD RIGHT HAND  Final   Special Requests   Final    BOTTLES DRAWN AEROBIC AND ANAEROBIC Blood Culture adequate volume   Culture   Final    NO GROWTH < 12 HOURS Performed at [facility], [address] [place], [place] [address]    Report Status PENDING  Incomplete  Blood culture (routine x 2)     Status: None (Preliminary result)   Collection Time: 06/11/20  8:22 PM   Specimen: BLOOD  Result Value Ref Range Status   Specimen Description BLOOD RIGHT ANTECUBITAL  Final   Special Requests   Final    BOTTLES DRAWN AEROBIC AND ANAEROBIC Blood Culture adequate volume   Culture   Final    NO GROWTH < 12 HOURS Performed at [facility], [address] [place], [place] [address]    Report Status PENDING  Incomplete  SARS Coronavirus 2 by RT PCR (hospital order, performed in [place] hospital lab) Nasopharyngeal Nasopharyngeal Swab     Status: None   Collection Time: 06/12/20 12:45 AM   Specimen: Nasopharyngeal Swab  Result Value Ref Range Status   SARS Coronavirus 2 NEGATIVE NEGATIVE Final    Comment: (NOTE) SARS-CoV-2 target nucleic acids are NOT DETECTED.  The SARS-CoV-2 RNA is generally  detectable in upper and lower respiratory specimens during the acute phase of infection. The lowest concentration of SARS-CoV-2 viral copies this assay can detect is 250 copies / mL. A negative result does not preclude SARS-CoV-2 infection and should not be used as the sole basis for treatment or other patient management decisions.  A negative result may occur with improper specimen collection / handling, submission of specimen other than nasopharyngeal swab, presence of viral mutation(s) within the  areas targeted by this assay, and inadequate number of viral copies (<250 copies / mL). A negative result must be combined with clinical observations, patient history, and epidemiological information.  Fact Sheet for Patients:   StrictlyIdeas.no  Fact Sheet for Healthcare Providers: BankingDealers.co.za  This test is not yet approved or  cleared by the [place] FDA and has been authorized for detection and/or diagnosis of SARS-CoV-2 by FDA under an Emergency Use Authorization (EUA).  This EUA will remain in effect (meaning this test can be used) for the duration of the COVID-19 declaration under Section 564(b)(1) of the Act, 21 U.S.C. section 360bbb-3(b)(1), unless the authorization is terminated or revoked sooner.  Performed at [facility], [address] [place], [place] [address]   MRSA PCR Screening     Status: None   Collection Time: 06/12/20  6:35 AM   Specimen: Nasopharyngeal  Result Value Ref Range Status   MRSA by PCR NEGATIVE NEGATIVE Final    Comment:        The GeneXpert MRSA Assay (FDA approved for NASAL specimens only), is one component of a comprehensive MRSA colonization surveillance program. It is not intended to diagnose MRSA infection nor to guide or monitor treatment for MRSA infections. Performed at [facility], [address] [place], [place] [address]       Radiology  Studies: CT TIBIA FIBULA LEFT W CONTRAST  Result Date: 06/11/2020 CLINICAL DATA:  Diffuse swelling, question of osteomyelitis EXAM: CT OF THE LOWER RIGHT EXTREMITY WITH CONTRAST TECHNIQUE: Multidetector CT imaging of the lower right extremity was performed according to the standard protocol following intravenous contrast administration. CONTRAST:  136mL OMNIPAQUE IOHEXOL 300 MG/ML  SOLN COMPARISON:  None. FINDINGS: Bones/Joint/Cartilage No fracture or dislocation. No areas of cortical destruction or periosteal reaction. There is mild to moderate medial compartment osteoarthritis with joint space loss. The tibiotalar joint appears to be intact. Ligaments Suboptimally assessed by CT. Muscles and Tendons The muscles surrounding the lower extremity appear to be intact without focal atrophy or tear. The visualized portions of the tendons are intact. Soft tissues There is diffuse subcutaneous edema and skin thickening seen surrounding the lower extremity. No loculated fluid collections are noted. IMPRESSION: Findings which may be suggestive of diffuse cellulitis. No evidence of osteomyelitis or loculated fluid collections. Electronically Signed   By: Prudencio Pair M.D.   On: 06/11/2020 21:07   US Venous Img Lower Unilateral Left  Result Date: 06/11/2020 CLINICAL DATA:  75 year old male with left lower extremity pain and swelling. EXAM: Left LOWER EXTREMITY VENOUS DOPPLER ULTRASOUND TECHNIQUE: Gray-scale sonography with compression, as well as color and duplex ultrasound, were performed to evaluate the deep venous system(s) from the level of the common femoral vein through the popliteal and proximal calf veins. COMPARISON:  None. FINDINGS: VENOUS Normal compressibility of the common femoral, superficial femoral, and popliteal veins, as well as the visualized calf veins. Visualized portions of profunda femoral vein and great saphenous vein unremarkable. No filling defects to suggest DVT on grayscale or color Doppler  imaging. Doppler waveforms show normal direction of venous flow, normal respiratory plasticity and response to augmentation. Limited views of the contralateral common femoral vein are unremarkable. OTHER There is diffuse subcutaneous edema of the calf. Limitations: none IMPRESSION: No DVT in the left lower extremity. Electronically Signed   By: Anner Crete M.D.   On: 06/11/2020 17:34   [place] LT LOWER EXTREM LTD SOFT TISSUE NON VASCULAR  Result Date: 06/11/2020 CLINICAL DATA:  75 year old male  with left lower extremity pain and swelling. EXAM: ULTRASOUND left LOWER EXTREMITY LIMITED TECHNIQUE: Ultrasound examination of the lower extremity soft tissues was performed in the area of clinical concern. COMPARISON:  None. FINDINGS: There is diffuse subcutaneous edema of the left lower extremity with mild hyperemia which may represent cellulitis. No drainable fluid collection. IMPRESSION: Diffuse subcutaneous edema may represent cellulitis. Electronically Signed   By: Anner Crete M.D.   On: 06/11/2020 17:36     Scheduled Meds: . allopurinol  300 mg Oral QHS  . aspirin EC  81 mg Oral Daily  . diltiazem  240 mg Oral Daily  . enoxaparin (LOVENOX) injection  40 mg Subcutaneous Q24H  . losartan  100 mg Oral Daily   And  . hydrochlorothiazide  12.5 mg Oral Daily  . insulin aspart  0-9 Units Subcutaneous TID WC  . pantoprazole  40 mg Oral Daily  . simvastatin  10 mg Oral QHS  . sucralfate  1 g Oral BID  . tamsulosin  0.4 mg Oral QPC supper   Continuous Infusions: . sodium chloride Stopped (06/12/20 1220)  . vancomycin Stopped (06/12/20 1220)     LOS: 1 day     Enzo Bi, MD [facility] If 7PM-7AM, please contact night-coverage 06/12/2020, 8:31 PM

## 2020-06-12 NOTE — Plan of Care (Signed)
Care plan initiated.

## 2020-06-12 NOTE — ED Notes (Signed)
Pharmacy contacted to verify insulin.

## 2020-06-13 ENCOUNTER — Encounter: Payer: Self-pay | Admitting: Physician Assistant

## 2020-06-13 DIAGNOSIS — L03116 Cellulitis of left lower limb: Secondary | ICD-10-CM | POA: Diagnosis not present

## 2020-06-13 LAB — CBC
HCT: 43.3 % (ref 39.0–52.0)
Hemoglobin: 14.6 g/dL (ref 13.0–17.0)
MCH: 27.1 pg (ref 26.0–34.0)
MCHC: 33.7 g/dL (ref 30.0–36.0)
MCV: 80.5 fL (ref 80.0–100.0)
Platelets: 214 10*3/uL (ref 150–400)
RBC: 5.38 MIL/uL (ref 4.22–5.81)
RDW: 13.9 % (ref 11.5–15.5)
WBC: 6 10*3/uL (ref 4.0–10.5)
nRBC: 0 % (ref 0.0–0.2)

## 2020-06-13 LAB — BASIC METABOLIC PANEL
Anion gap: 12 (ref 5–15)
BUN: 21 mg/dL (ref 8–23)
CO2: 24 mmol/L (ref 22–32)
Calcium: 9.3 mg/dL (ref 8.9–10.3)
Chloride: 99 mmol/L (ref 98–111)
Creatinine, Ser: 0.83 mg/dL (ref 0.61–1.24)
GFR, Estimated: >60 mL/min (ref >60)
Glucose, Bld: 145 mg/dL — ABNORMAL HIGH (ref 70–99)
Potassium: 3.8 mmol/L (ref 3.5–5.1)
Sodium: 135 mmol/L (ref 135–145)

## 2020-06-13 LAB — MAGNESIUM: Magnesium: 1.8 mg/dL (ref 1.7–2.4)

## 2020-06-13 LAB — GLUCOSE, CAPILLARY: Glucose-Capillary: 139 mg/dL — ABNORMAL HIGH (ref 70–99)

## 2020-06-13 MED ORDER — DOXYCYCLINE HYCLATE 100 MG PO TABS: 100.0000 mg | ORAL_TABLET | Freq: Two times a day (BID) | ORAL | 0 refills | Status: AC

## 2020-06-13 MED ORDER — DOXYCYCLINE HYCLATE 100 MG PO TABS
100.0000 mg | ORAL_TABLET | Freq: Two times a day (BID) | ORAL | Status: DC
  Administered 2020-06-13: 100 mg via ORAL
  Filled 2020-06-13: qty 1

## 2020-06-13 NOTE — Discharge Summary (Signed)
[facility] NAME: [name]    MR#:  [id number]  DATE OF BIRTH:  07/07/1945  DATE OF ADMISSION:  06/11/2020 ADMITTING PHYSICIAN: Clance Boll, MD  DATE OF DISCHARGE: 06/13/2020  PRIMARY CARE PHYSICIAN: Luiz Ochoa, NP    ADMISSION DIAGNOSIS:  Cellulitis [L03.90]  DISCHARGE DIAGNOSIS:  Left Calf cellulitis  SECONDARY DIAGNOSIS:   Past Medical History:  Diagnosis Date  . Diabetes mellitus without complication ([place])   . GERD (gastroesophageal reflux disease)   . Hyperlipidemia   . Hypertension     HOSPITAL COURSE:   Gary Bishopis a 75 y.o.malewith medical history significant of DMII,GERD,HLD, HTN who presents to ed in referral from pcp for diagnosis of cellulitis with failure of out patient treatment. Per patient his symptoms started27th of January. He states he scratched himself and broke skin he states after that he notes swelling redness and pain to left lower extremity. He states after a week of symptoms he followed up with pcp and was started on oral antibiotics. He noteseven while on antibiotics he noted continued redness, pain and swelling.  Due this he was referred to ER.   Cellulitis of left lower leg --failed outpatient treatment pf 5 days of augmentin.  Pain, swelling and erythema over left calf area--now improved -negative soft tissue u/s -not noted abscess   -negative venous doppler for dvt  --started on IV vanc with much improvement --change to po Doxycyline  --BC negative --Wbc wnl, no fever  DMII, well controlled -lasta1c 6.6 --resume home metformin at d/c  GERD --cont home PPI  HLD -continue statin   HTN --cont home diltiazem, losartan/HCTZ and BB  Gout -continue allopurinol   DVT prophylaxis: Lovenox SQ Code Status: Full code  Family Communication:pt says family is informed Status is: inpatient Dispo:   The patient is from: home Anticipated d/c is to:  home Anticipated d/c date is: today Patient currently ismedically ready to d/c  CONSULTS OBTAINED:    DRUG ALLERGIES:  No Known Allergies  DISCHARGE MEDICATIONS:   Allergies as of 06/13/2020   No Known Allergies     Medication List    STOP taking these medications   albuterol 108 (90 Base) MCG/ACT inhaler Commonly known as: VENTOLIN HFA   amoxicillin-clavulanate 875-125 MG tablet Commonly known as: AUGMENTIN   benzonatate 200 MG capsule Commonly known as: TESSALON   esomeprazole 40 MG capsule Commonly known as: NEXIUM   HYDROcodone-acetaminophen 10-325 MG tablet Commonly known as: NORCO   triamcinolone 0.025 % cream Commonly known as: KENALOG     TAKE these medications   allopurinol 300 MG tablet Commonly known as: ZYLOPRIM TAKE 1 TABLET BY MOUTH AT BEDTIME FOR GOUT   aspirin EC 81 MG tablet Take 81 mg by mouth daily. Swallow whole.   colchicine 0.6 MG tablet TAKE 1 TAB BY MOUTH TWICE DAILY X 3 DAYS FOR GOUT FLARE UP. OTHERWISE ONCE DAILY FOR PREVENTION   diltiazem 240 MG 24 hr capsule Commonly known as: Cartia XT Take 1 capsule (240 mg total) by mouth daily.   doxycycline 100 MG tablet Commonly known as: VIBRA-TABS Take 1 tablet (100 mg total) by mouth every 12 (twelve) hours for 5 days.   losartan-hydrochlorothiazide 100-12.5 MG tablet Commonly known as: HYZAAR Take 1 tablet by mouth daily.   metFORMIN 500 MG tablet Commonly known as: GLUCOPHAGE Take 1 tablet (500 mg total) by mouth 2 (two) times daily with a meal.   metoprolol  succinate 50 MG 24 hr tablet Commonly known as: TOPROL-XL Take 50 mg by mouth daily.   potassium chloride SA 20 MEQ tablet Commonly known as: KLOR-CON Take 1 tablet by mouth daily.   simvastatin 10 MG tablet Commonly known as: ZOCOR TAKE 1 TABLET BY MOUTH EVERY DAY   sucralfate 1 g tablet Commonly known as: Carafate Take 1 tablet (1 g total) by mouth 4 (four) times daily. What changed: when to take this    tamsulosin 0.4 MG Caps capsule Commonly known as: FLOMAX TAKE 1 CAPSULE (0.4 MG TOTAL) BY MOUTH DAILY.       If you experience worsening of your admission symptoms, develop shortness of breath, life threatening emergency, suicidal or homicidal thoughts you must seek medical attention immediately by calling 911 or calling your MD immediately  if symptoms less severe.  You Must read complete instructions/literature along with all the possible adverse reactions/side effects for all the Medicines you take and that have been prescribed to you. Take any new Medicines after you have completely understood and accept all the possible adverse reactions/side effects.   Please note  You were cared for by a hospitalist during your hospital stay. If you have any questions about your discharge medications or the care you received while you were in the hospital after you are discharged, you can call the unit and asked to speak with the hospitalist on call if the hospitalist that took care of you is not available. Once you are discharged, your primary care physician will handle any further medical issues. Please note that NO REFILLS for any discharge medications will be authorized once you are discharged, as it is imperative that you return to your primary care physician (or establish a relationship with a primary care physician if you do not have one) for your aftercare needs so that they can reassess your need for medications and monitor your lab values. Today   SUBJECTIVE  Doing well. Feels better overall. Left leg swelling improved Dry scab being formed. No local tenderness   VITAL SIGNS:  Blood pressure 140/67, pulse 62, temperature 97.8 F (36.6 C), temperature source Oral, resp. rate 16, weight 107 kg, SpO2 97 %.  I/O:  No intake or output data in the 24 hours ending 06/13/20 0747  PHYSICAL EXAMINATION:  GENERAL:  75 y.o.-year-old patient lying in the bed with no acute distress.  LUNGS: Normal  breath sounds bilaterally, no wheezing, rales,rhonchi or crepitation. No use of accessory muscles of respiration.  CARDIOVASCULAR: S1, S2 normal. No murmurs, rubs, or gallops.  ABDOMEN: Soft, non-tender, non-distended. Bowel sounds present. No organomegaly or mass.  EXTREMITIES: No pedal edema, cyanosis, or clubbing. Scab about quarter size left calf--minimal edema. No redness or tenderness NEUROLOGIC: grossly nonfocal Muscle strength 5/5 in all extremities. Sensation intact. Gait not checked.  PSYCHIATRIC: patient is alert and oriented x 3.  SKIN: No obvious rash, lesion, or ulcer.   DATA REVIEW:   CBC  Recent Labs  Lab 06/13/20 0501  WBC 6.0  HGB 14.6  HCT 43.3  PLT 214    Chemistries  Recent Labs  Lab 06/12/20 0444 06/13/20 0501  NA 135 135  K 3.6 3.8  CL 97* 99  CO2 26 24  GLUCOSE 180* 145*  BUN 20 21  CREATININE 0.82 0.83  CALCIUM 9.7 9.3  MG  --  1.8  AST 16  --   ALT 18  --   ALKPHOS 73  --   BILITOT 0.8  --  Microbiology Results   Recent Results (from the past 240 hour(s))  Blood culture (routine x 2)     Status: None (Preliminary result)   Collection Time: 06/11/20  8:09 PM   Specimen: BLOOD  Result Value Ref Range Status   Specimen Description BLOOD BLOOD RIGHT HAND  Final   Special Requests   Final    BOTTLES DRAWN AEROBIC AND ANAEROBIC Blood Culture adequate volume   Culture   Final    NO GROWTH 2 DAYS Performed at [facility], [address] [place], [place] [address]    Report Status PENDING  Incomplete  Blood culture (routine x 2)     Status: None (Preliminary result)   Collection Time: 06/11/20  8:22 PM   Specimen: BLOOD  Result Value Ref Range Status   Specimen Description BLOOD RIGHT ANTECUBITAL  Final   Special Requests   Final    BOTTLES DRAWN AEROBIC AND ANAEROBIC Blood Culture adequate volume   Culture   Final    NO GROWTH 2 DAYS Performed at [facility], [address], [place], [place] [address]     Report Status PENDING  Incomplete  SARS Coronavirus 2 by RT PCR (hospital order, performed in [facility] hospital lab) Nasopharyngeal Nasopharyngeal Swab     Status: None   Collection Time: 06/12/20 12:45 AM   Specimen: Nasopharyngeal Swab  Result Value Ref Range Status   SARS Coronavirus 2 NEGATIVE NEGATIVE Final    Comment: (NOTE) SARS-CoV-2 target nucleic acids are NOT DETECTED.  The SARS-CoV-2 RNA is generally detectable in upper and lower respiratory specimens during the acute phase of infection. The lowest concentration of SARS-CoV-2 viral copies this assay can detect is 250 copies / mL. A negative result does not preclude SARS-CoV-2 infection and should not be used as the sole basis for treatment or other patient management decisions.  A negative result may occur with improper specimen collection / handling, submission of specimen other than nasopharyngeal swab, presence of viral mutation(s) within the areas targeted by this assay, and inadequate number of viral copies (<250 copies / mL). A negative result must be combined with clinical observations, patient history, and epidemiological information.  Fact Sheet for Patients:   StrictlyIdeas.no  Fact Sheet for Healthcare Providers: BankingDealers.co.za  This test is not yet approved or  cleared by the [place] FDA and has been authorized for detection and/or diagnosis of SARS-CoV-2 by FDA under an Emergency Use Authorization (EUA).  This EUA will remain in effect (meaning this test can be used) for the duration of the COVID-19 declaration under Section 564(b)(1) of the Act, 21 U.S.C. section 360bbb-3(b)(1), unless the authorization is terminated or revoked sooner.  Performed at [facility], [address] [place], [place] [address]   MRSA PCR Screening     Status: None   Collection Time: 06/12/20  6:35 AM   Specimen: Nasopharyngeal  Result Value Ref Range  Status   MRSA by PCR NEGATIVE NEGATIVE Final    Comment:        The GeneXpert MRSA Assay (FDA approved for NASAL specimens only), is one component of a comprehensive MRSA colonization surveillance program. It is not intended to diagnose MRSA infection nor to guide or monitor treatment for MRSA infections. Performed at [facility], [address] [place],  [address]     RADIOLOGY:  CT TIBIA FIBULA LEFT W CONTRAST  Result Date: 06/11/2020 CLINICAL DATA:  Diffuse swelling, question of osteomyelitis EXAM: CT OF THE LOWER  RIGHT EXTREMITY WITH CONTRAST TECHNIQUE: Multidetector CT imaging of the lower right extremity was performed according to the standard protocol following intravenous contrast administration. CONTRAST:  140mL OMNIPAQUE IOHEXOL 300 MG/ML  SOLN COMPARISON:  None. FINDINGS: Bones/Joint/Cartilage No fracture or dislocation. No areas of cortical destruction or periosteal reaction. There is mild to moderate medial compartment osteoarthritis with joint space loss. The tibiotalar joint appears to be intact. Ligaments Suboptimally assessed by CT. Muscles and Tendons The muscles surrounding the lower extremity appear to be intact without focal atrophy or tear. The visualized portions of the tendons are intact. Soft tissues There is diffuse subcutaneous edema and skin thickening seen surrounding the lower extremity. No loculated fluid collections are noted. IMPRESSION: Findings which may be suggestive of diffuse cellulitis. No evidence of osteomyelitis or loculated fluid collections. Electronically Signed   By: Prudencio Pair M.D.   On: 06/11/2020 21:07   US Venous Img Lower Unilateral Left  Result Date: 06/11/2020 CLINICAL DATA:  75 year old male with left lower extremity pain and swelling. EXAM: Left LOWER EXTREMITY VENOUS DOPPLER ULTRASOUND TECHNIQUE: Gray-scale sonography with compression, as well as color and duplex ultrasound, were performed to evaluate the deep venous  system(s) from the level of the common femoral vein through the popliteal and proximal calf veins. COMPARISON:  None. FINDINGS: VENOUS Normal compressibility of the common femoral, superficial femoral, and popliteal veins, as well as the visualized calf veins. Visualized portions of profunda femoral vein and great saphenous vein unremarkable. No filling defects to suggest DVT on grayscale or color Doppler imaging. Doppler waveforms show normal direction of venous flow, normal respiratory plasticity and response to augmentation. Limited views of the contralateral common femoral vein are unremarkable. OTHER There is diffuse subcutaneous edema of the calf. Limitations: none IMPRESSION: No DVT in the left lower extremity. Electronically Signed   By: Anner Crete M.D.   On: 06/11/2020 17:34   [place] LT LOWER EXTREM LTD SOFT TISSUE NON VASCULAR  Result Date: 06/11/2020 CLINICAL DATA:  75 year old male with left lower extremity pain and swelling. EXAM: ULTRASOUND left LOWER EXTREMITY LIMITED TECHNIQUE: Ultrasound examination of the lower extremity soft tissues was performed in the area of clinical concern. COMPARISON:  None. FINDINGS: There is diffuse subcutaneous edema of the left lower extremity with mild hyperemia which may represent cellulitis. No drainable fluid collection. IMPRESSION: Diffuse subcutaneous edema may represent cellulitis. Electronically Signed   By: Anner Crete M.D.   On: 06/11/2020 17:36     CODE STATUS:     Code Status Orders  (From admission, onward)         Start     Ordered   06/11/20 1952  Full code  Continuous        06/11/20 1957        Code Status History    Date Active Date Inactive Code Status Order ID Comments User Context   04/30/2018 1506 05/04/2018 1517 Full Code [id number]  Saundra Shelling, MD Inpatient   Advance Care Planning Activity       TOTAL TIME TAKING CARE OF THIS PATIENT: *35 minutes.    Fritzi Mandes M.D  [facility]    CC: Primary  care physician; Luiz Ochoa, NP

## 2020-06-13 NOTE — Plan of Care (Signed)
  Problem: Skin Integrity: Goal: Skin integrity will improve Outcome: Progressing   Problem: Cardiac: Goal: Ability to achieve and maintain adequate cardiopulmonary perfusion will improve Outcome: Progressing   Problem: Education: Goal: Ability to demonstrate management of disease process will improve Outcome: Progressing Goal: Ability to verbalize understanding of medication therapies will improve Outcome: Progressing Goal: Individualized Educational Video(s) Outcome: Progressing

## 2020-06-13 NOTE — Progress Notes (Signed)
Pt discharged home in own vehicle. Went over discharge instructions and pt showed understanding. Took IV out of left forearm without complications. Pt wheeled down to medical mall by volunteer services.

## 2020-06-15 ENCOUNTER — Encounter: Payer: Self-pay | Admitting: Emergency Medicine

## 2020-06-15 ENCOUNTER — Emergency Department
Admission: EM | Admit: 2020-06-15 | Discharge: 2020-06-15 | Disposition: A | Discharge status: Home / Self Care | Payer: Medicare HMO | Attending: Emergency Medicine | Admitting: Emergency Medicine

## 2020-06-15 ENCOUNTER — Other Ambulatory Visit: Payer: Self-pay

## 2020-06-15 DIAGNOSIS — Z5321 Procedure and treatment not carried out due to patient leaving prior to being seen by health care provider: Secondary | ICD-10-CM | POA: Insufficient documentation

## 2020-06-15 DIAGNOSIS — S81802A Unspecified open wound, left lower leg, initial encounter: Secondary | ICD-10-CM | POA: Diagnosis not present

## 2020-06-15 DIAGNOSIS — X58XXXA Exposure to other specified factors, initial encounter: Secondary | ICD-10-CM | POA: Insufficient documentation

## 2020-06-15 LAB — COMPREHENSIVE METABOLIC PANEL
ALT: 21 U/L (ref 0–44)
AST: 18 U/L (ref 15–41)
Albumin: 3.8 g/dL (ref 3.5–5.0)
Alkaline Phosphatase: 76 U/L (ref 38–126)
Anion gap: 10 (ref 5–15)
BUN: 22 mg/dL (ref 8–23)
CO2: 25 mmol/L (ref 22–32)
Calcium: 9.6 mg/dL (ref 8.9–10.3)
Chloride: 103 mmol/L (ref 98–111)
Creatinine, Ser: 0.81 mg/dL (ref 0.61–1.24)
GFR, Estimated: >60 mL/min (ref >60)
Glucose, Bld: 115 mg/dL — ABNORMAL HIGH (ref 70–99)
Potassium: 3.9 mmol/L (ref 3.5–5.1)
Sodium: 138 mmol/L (ref 135–145)
Total Bilirubin: 0.7 mg/dL (ref 0.3–1.2)
Total Protein: 7.6 g/dL (ref 6.5–8.1)

## 2020-06-15 LAB — CBC WITH DIFFERENTIAL/PLATELET
Abs Immature Granulocytes: 0.03 10*3/uL (ref 0.00–0.07)
Basophils Absolute: 0.1 10*3/uL (ref 0.0–0.1)
Basophils Relative: 1 %
Eosinophils Absolute: 0.3 10*3/uL (ref 0.0–0.5)
Eosinophils Relative: 4 %
HCT: 44.6 % (ref 39.0–52.0)
Hemoglobin: 14.7 g/dL (ref 13.0–17.0)
Immature Granulocytes: 1 %
Lymphocytes Relative: 26 %
Lymphs Abs: 1.7 10*3/uL (ref 0.7–4.0)
MCH: 26.9 pg (ref 26.0–34.0)
MCHC: 33 g/dL (ref 30.0–36.0)
MCV: 81.7 fL (ref 80.0–100.0)
Monocytes Absolute: 0.5 10*3/uL (ref 0.1–1.0)
Monocytes Relative: 7 %
Neutro Abs: 3.9 10*3/uL (ref 1.7–7.7)
Neutrophils Relative %: 61 %
Platelets: 264 10*3/uL (ref 150–400)
RBC: 5.46 MIL/uL (ref 4.22–5.81)
RDW: 14.1 % (ref 11.5–15.5)
WBC: 6.4 10*3/uL (ref 4.0–10.5)
nRBC: 0 % (ref 0.0–0.2)

## 2020-06-15 NOTE — ED Notes (Signed)
First Nurse Note: Pt to ED via POV, pt states that he recently had COVID pneumonia. Pt had D-dimer yesterday at [facility], if was 625.44

## 2020-06-15 NOTE — ED Triage Notes (Signed)
Pt reports wound on left calf was previously seen here on 7th for same swelling noted on the calf, pt reports swelling had decreased over the last week but started swelling again yesterday. Pt reports that the wound busted. Pt reports he has continued taking prescribed antibiotics since being seen and has been placing neosporin on the area. Area appears open and draining, with black area in center. No warmth noted with palpation. Denies any other complains.

## 2020-06-15 NOTE — ED Notes (Signed)
This RN completed documentation previous triage for this ED visit under wrong use name

## 2020-06-15 NOTE — ED Notes (Signed)
Pt wife to front desk to check about pts wait time. Pt wife informed pt was waiting for a room to see EDP.

## 2020-06-16 LAB — CULTURE, BLOOD (ROUTINE X 2)
Culture: NO GROWTH
Culture: NO GROWTH
Special Requests: ADEQUATE
Special Requests: ADEQUATE

## 2020-06-20 ENCOUNTER — Other Ambulatory Visit: Payer: Self-pay

## 2020-06-20 ENCOUNTER — Encounter: Payer: Medicare HMO | Attending: Internal Medicine | Admitting: Internal Medicine

## 2020-06-20 DIAGNOSIS — L03116 Cellulitis of left lower limb: Secondary | ICD-10-CM | POA: Diagnosis not present

## 2020-06-20 DIAGNOSIS — L97228 Non-pressure chronic ulcer of left calf with other specified severity: Secondary | ICD-10-CM | POA: Diagnosis not present

## 2020-06-20 DIAGNOSIS — E11622 Type 2 diabetes mellitus with other skin ulcer: Secondary | ICD-10-CM | POA: Diagnosis not present

## 2020-06-20 DIAGNOSIS — Z950 Presence of cardiac pacemaker: Secondary | ICD-10-CM | POA: Insufficient documentation

## 2020-06-20 DIAGNOSIS — L97222 Non-pressure chronic ulcer of left calf with fat layer exposed: Secondary | ICD-10-CM | POA: Diagnosis not present

## 2020-06-20 DIAGNOSIS — I87312 Chronic venous hypertension (idiopathic) with ulcer of left lower extremity: Secondary | ICD-10-CM | POA: Diagnosis not present

## 2020-06-21 NOTE — Progress Notes (Signed)
NICHOLOUS, GIRGENTI ([id number]) Visit Report for 06/20/2020 Abuse/Suicide Risk Screen Details Patient Name: [name], [name]. Date of Service: 06/20/2020 3:30 PM Medical Record Number: [id number] Patient Account Number: [id number] Date of Birth/Sex: 09/02/1945 (75 y.o. Male) Treating RN: [name] Primary Care Makayli Bracken: [name] Other Clinician: Referring Rumor Sun: Clayborn Bigness Treating Jamiel Goncalves/Extender: [name]: 0 Abuse/Suicide Risk Screen Items Answer ABUSE RISK SCREEN: Has anyone close to you tried to hurt or harm you recentlyo No Do you feel uncomfortable with anyone in your familyo No Has anyone forced you do things that you didnot want to doo No Electronic Signature(s) Signed: 06/21/2020 12:42:05 PM By: [name], [name] RN Entered By: [name], [name] on 06/20/2020 15:58:07 [name] ([id number]) -------------------------------------------------------------------------------- Activities of Daily Living Details Patient Name: [name], Gary A. Date of Service: 06/20/2020 3:30 PM Medical Record Number: [id number] Patient Account Number: [id number] Date of Birth/Sex: Aug 02, 1945 (75 y.o. Male) Treating RN: [name] Primary Care Kameron Blethen: [name] Other Clinician: Referring Diron Haddon: Clayborn Bigness Treating Phylisha Dix/Extender: [name]: 0 Activities of Daily Living Items Answer Activities of Daily Living (Please select one for each item) Drive Automobile Completely Able Take Medications Completely Able Use Telephone Completely Able Care for Appearance Completely Able Use Toilet Completely Able Bath / Shower Completely Able Dress Self Completely Able Feed Self Completely Able Walk Completely Able Get In / Out Bed Completely Able Housework Completely Able Prepare Meals Completely [place] for Self Completely Able Electronic Signature(s) Signed: 06/21/2020 12:42:05  PM By: [name], [name] RN Entered By: [name], [name] on 06/20/2020 15:58:23 [name] ([id number]) -------------------------------------------------------------------------------- Education Screening Details Patient Name: Gary Merlin A. Date of Service: 06/20/2020 3:30 PM Medical Record Number: [id number] Patient Account Number: [id number] Date of Birth/Sex: 01/31/1946 (75 y.o. Male) Treating RN: [name] Primary Care Tailey Top: [name] Other Clinician: Referring Khanh Cordner: Clayborn Bigness Treating Abid Bolla/Extender: [name]: 0 Primary Learner Assessed: Patient Learning Preferences/Education Level/Primary Language Learning Preference: Explanation, Demonstration Highest Education Level: High School Preferred Language: English Cognitive Barrier Language Barrier: No Translator Needed: No Memory Deficit: No Emotional Barrier: No Cultural/Religious Beliefs Affecting Medical Care: No Physical Barrier Impaired Vision: No Impaired Hearing: No Decreased Hand dexterity: No Knowledge/Comprehension Knowledge Level: Medium Comprehension Level: Medium Ability to understand written instructions: Medium Ability to understand verbal instructions: Medium Motivation Anxiety Level: Calm Cooperation: Cooperative Education Importance: Acknowledges Need Interest in Health Problems: Asks Questions Perception: Coherent Willingness to Engage in Self-Management High Activities: Readiness to Engage in Self-Management High Activities: Electronic Signature(s) Signed: 06/21/2020 12:42:05 PM By: [name], [name] RN Entered By: [name], [name] on 06/20/2020 15:58:56 [name] ([id number]) -------------------------------------------------------------------------------- Fall Risk Assessment Details Patient Name: [name]. Date of Service: 06/20/2020 3:30 PM Medical Record Number: [id number] Patient Account Number:  [id number] Date of Birth/Sex: [birth year] (75 y.o. Male) Treating RN: [name] Primary Care Lanny Lipkin: [name] Other Clinician: Referring Shadee Rathod: Clayborn Bigness Treating Kayn Haymore/Extender: [name]: 0 Fall Risk Assessment Items Have you had 2 or more falls in the last 12 monthso 0 No Have you had any fall that resulted in injury in the last 12 monthso 0 No FALLS RISK SCREEN History of falling - immediate or within 3 months 0 No Secondary diagnosis (Do you have 2 or more medical diagnoseso) 15 Yes Ambulatory aid None/bed rest/wheelchair/nurse 0 Yes Crutches/cane/walker 0 No Furniture 0 No Intravenous therapy Access/Saline/Heparin Lock 0 No Gait/Transferring  Normal/ bed rest/ wheelchair 0 Yes Weak (short steps with or without shuffle, stooped but able to lift head while walking, may 0 No seek support from furniture) Impaired (short steps with shuffle, may have difficulty arising from chair, head down, impaired 0 No balance) Mental Status Oriented to own ability 0 Yes Electronic Signature(s) Signed: 06/21/2020 12:42:05 PM By: [name], [name] RN Entered By: [name], Kenia on 06/20/2020 15:59:10 [name] ([id number]) -------------------------------------------------------------------------------- Foot Assessment Details Patient Name: Gary Merlin A. Date of Service: 06/20/2020 3:30 PM Medical Record Number: [id number] Patient Account Number: [id number] Date of Birth/Sex: 1945/07/10 (75 y.o. Male) Treating RN: [name] Primary Care Yizel Canby: [name] Other Clinician: Referring Pleasant Britz: Clayborn Bigness Treating Meia Emley/Extender: [name]: 0 Foot Assessment Items Site Locations + = Sensation present, - = Sensation absent, C = Callus, U = Ulcer R = Redness, W = Warmth, M = Maceration, PU = Pre-ulcerative lesion F = Fissure, S = Swelling, D = Dryness Assessment Right: Left: Other  Deformity: No No Prior Foot Ulcer: No No Prior Amputation: No No Charcot Joint: No No Ambulatory Status: Ambulatory Without Help Gait: Steady Electronic Signature(s) Signed: 06/21/2020 12:42:05 PM By: [name], [name] RN Entered By: [name], [name] on 06/20/2020 16:05:02 [name] ([id number]) -------------------------------------------------------------------------------- Nutrition Risk Screening Details Patient Name: Gary Merlin A. Date of Service: 06/20/2020 3:30 PM Medical Record Number: [id number] Patient Account Number: [id number] Date of Birth/Sex: [birth year] (75 y.o. Male) Treating RN: [name] Primary Care Davette Nugent: [name] Other Clinician: Referring Alegra Rost: Clayborn Bigness Treating Saima Monterroso/Extender: [name]: 0 Height (in): Weight (lbs): Body Mass Index (BMI): Nutrition Risk Screening Items Score Screening NUTRITION RISK SCREEN: I have an illness or condition that made me change the kind and/or amount of food I eat 0 No I eat fewer than two meals per day 0 No I eat few fruits and vegetables, or milk products 0 No I have three or more drinks of beer, liquor or wine almost every day 0 No I have tooth or mouth problems that make it hard for me to eat 0 No I don't always have enough money to buy the food I need 0 No I eat alone most of the time 0 No I take three or more different prescribed or over-the-counter drugs a day 1 Yes Without wanting to, I have lost or gained 10 pounds in the last six months 0 No I am not always physically able to shop, cook and/or feed myself 0 No Nutrition Protocols Good Risk Protocol 0 No interventions needed Moderate Risk Protocol High Risk Proctocol Risk Level: Good Risk Score: 1 Electronic Signature(s) Signed: 06/21/2020 12:42:05 PM By: [name], [name] RN Entered By: [name], [name] on 06/20/2020 15:59:22

## 2020-06-21 NOTE — Progress Notes (Signed)
CAL, GINDLESPERGER ([id number]) Visit Report for 06/20/2020 Allergy List Details Patient Name: [name], [name]. Date of Service: 06/20/2020 3:30 PM Medical Record Number: [id number] Patient Account Number: [id number] Date of Birth/Sex: 22-Dec-1945 (75 y.o. Male) Treating RN: [name] Primary Care Brewer Hitchman: [name] Other Clinician: Referring Reghan Thul: Clayborn Bigness Treating Loranzo Desha/Extender: [name]: 0 Allergies Active Allergies No Known Allergies Type: Allergen Allergy Notes Electronic Signature(s) Signed: 06/21/2020 12:42:05 PM By: [name], [name] RN Entered By: [name], [name] on 06/20/2020 15:51:14 [name] ([id number]) -------------------------------------------------------------------------------- Arrival Information Details Patient Name: [name], [name] Date of Service: 06/20/2020 3:30 PM Medical Record Number: [id number] Patient Account Number: [id number] Date of Birth/Sex: 1946-01-15 (75 y.o. Male) Treating RN: [name] Primary Care Slade Pierpoint: [name] Other Clinician: Referring Leahna Hewson: Clayborn Bigness Treating Humna Moorehouse/Extender: [name]: 0 Visit Information Patient Arrived: Ambulatory Arrival Time: 15:50 Accompanied By: self Transfer Assistance: None Patient Identification Verified: Yes Secondary Verification Process Completed: Yes Electronic Signature(s) Signed: 06/21/2020 12:42:05 PM By: [name], [name] RN Entered By: [name], [name] on 06/20/2020 15:50:58 [name] ([id number]) -------------------------------------------------------------------------------- Clinic Level of Care Assessment Details Patient Name: [name], [name] Date of Service: 06/20/2020 3:30 PM Medical Record Number: [id number] Patient Account Number: [id number] Date of Birth/Sex: 28-Jan-1946 (75 y.o. Male) Treating RN: [name] Primary Care Malayshia All: [name] Other  Clinician: Referring Osie Amparo: Clayborn Bigness Treating Diva Lemberger/Extender: [name]: 0 Clinic Level of Care Assessment Items TOOL 4 Quantity Score X - Use when only an EandM is performed on FOLLOW-UP visit 1 0 ASSESSMENTS - Nursing Assessment / Reassessment X - Reassessment of Co-morbidities (includes updates in patient status) 1 10 X- 1 5 Reassessment of Adherence to Treatment Plan ASSESSMENTS - Wound and Skin Assessment / Reassessment X - Simple Wound Assessment / Reassessment - one wound 1 5 []  - 0 Complex Wound Assessment / Reassessment - multiple wounds []  - 0 Dermatologic / Skin Assessment (not related to wound area) ASSESSMENTS - Focused Assessment []  - Circumferential Edema Measurements - multi extremities 0 []  - 0 Nutritional Assessment / Counseling / Intervention []  - 0 Lower Extremity Assessment (monofilament, tuning fork, pulses) X- 1 10 Peripheral Arterial Disease Assessment (using hand held doppler) ASSESSMENTS - Ostomy and/or Continence Assessment and Care []  - Incontinence Assessment and Management 0 []  - 0 Ostomy Care Assessment and Management (repouching, etc.) PROCESS - Coordination of Care X - Simple Patient / Family Education for ongoing care 1 15 []  - 0 Complex (extensive) Patient / Family Education for ongoing care []  - 0 Staff obtains Programmer, systems, Records, Test Results / Process Orders []  - 0 Staff telephones HHA, Nursing Homes / Clarify orders / etc []  - 0 Routine Transfer to another Facility (non-emergent condition) []  - 0 Routine Hospital Admission (non-emergent condition) []  - 0 New Admissions / Biomedical engineer / Ordering NPWT, Apligraf, etc. []  - 0 Emergency Hospital Admission (emergent condition) X- 1 10 Simple Discharge Coordination []  - 0 Complex (extensive) Discharge Coordination PROCESS - Special Needs []  - Pediatric / Minor Patient Management 0 []  - 0 Isolation Patient Management []  - 0 Hearing /  Language / Visual special needs []  - 0 Assessment of Community assistance (transportation, D/C planning, etc.) []  - 0 Additional assistance / Altered mentation []  - 0 Support Surface(s) Assessment (bed, cushion, seat, etc.) INTERVENTIONS - Wound Cleansing / Measurement NASHUA, HOMEWOOD A. ([id number]) X- 1 5 Simple Wound Cleansing - one wound []  - 0  Complex Wound Cleansing - multiple wounds X- 1 5 Wound Imaging (photographs - any number of wounds) []  - 0 Wound Tracing (instead of photographs) X- 1 5 Simple Wound Measurement - one wound []  - 0 Complex Wound Measurement - multiple wounds INTERVENTIONS - Wound Dressings []  - Small Wound Dressing one or multiple wounds 0 X- 1 15 Medium Wound Dressing one or multiple wounds []  - 0 Large Wound Dressing one or multiple wounds []  - 0 Application of Medications - topical []  - 0 Application of Medications - injection INTERVENTIONS - Miscellaneous []  - External ear exam 0 []  - 0 Specimen Collection (cultures, biopsies, blood, body fluids, etc.) []  - 0 Specimen(s) / Culture(s) sent or taken to Lab for analysis []  - 0 Patient Transfer (multiple staff / Civil Service fast streamer / Similar devices) []  - 0 Simple Staple / Suture removal (25 or less) []  - 0 Complex Staple / Suture removal (26 or more) []  - 0 Hypo / Hyperglycemic Management (close monitor of Blood Glucose) X- 1 15 Ankle / Brachial Index (ABI) - do not check if billed separately X- 1 5 Vital Signs Has the patient been seen at the hospital within the last three years: Yes Total Score: 105 Level Of Care: New/Established - Level 3 Electronic Signature(s) Signed: 06/21/2020 12:42:05 PM By: [name], [name] RN Entered By: [name], [name] on 06/20/2020 16:33:16 [name] ([id number]) -------------------------------------------------------------------------------- Encounter Discharge Information Details Patient Name: Gary Merlin A. Date of Service: 06/20/2020 3:30  PM Medical Record Number: [id number] Patient Account Number: [id number] Date of Birth/Sex: 1945/08/15 (75 y.o. Male) Treating RN: [name] Primary Care Areon Cocuzza: [name] Other Clinician: Referring Korrie Hofbauer: Clayborn Bigness Treating Kadedra Vanaken/Extender: [name]: 0 Encounter Discharge Information Items Post Procedure Vitals Discharge Condition: Stable Temperature (F): 98.2 Ambulatory Status: Ambulatory Pulse (bpm): 66 Discharge Destination: Home Respiratory Rate (breaths/min): 18 Transportation: Private Auto Blood Pressure (mmHg): 145/90 Accompanied By: self Schedule Follow-up Appointment: Yes Clinical Summary of Care: Electronic Signature(s) Signed: 06/21/2020 12:42:05 PM By: [name], [name] RN Entered By: [name], [name] on 06/20/2020 16:56:18 [name] ([id number]) -------------------------------------------------------------------------------- Lower Extremity Assessment Details Patient Name: Gary Merlin A. Date of Service: 06/20/2020 3:30 PM Medical Record Number: [id number] Patient Account Number: [id number] Date of Birth/Sex: 1945-12-26 (75 y.o. Male) Treating RN: [name] Primary Care Hunt Zajicek: [name] Other Clinician: Referring Adelaine Roppolo: Clayborn Bigness Treating Dermot Gremillion/Extender: [name]: 0 Edema Assessment Assessed: [Left: Yes] [Right: Yes] Edema: [Left: Yes] [Right: Yes] Calf Left: Right: Point of Measurement: 31 cm From Medial Instep 37 cm 34.5 cm Ankle Left: Right: Point of Measurement: 10 cm From Medial Instep 29 cm 23.5 cm Knee To Floor Left: Right: From Medial Instep 52 cm 51 cm Vascular Assessment Pulses: Dorsalis Pedis Palpable: [Left:Yes] [Right:Yes] Doppler Audible: [Left:Yes] Posterior Tibial Doppler Audible: [Left:Yes] Blood Pressure: Brachial: [Left:145] Dorsalis Pedis: 168 Ankle: Posterior Tibial: 182 Ankle Brachial Index: [Left:1.26] Electronic  Signature(s) Signed: 06/21/2020 12:42:05 PM By: [name], [name] RN Entered By: [name], [name] on 06/20/2020 16:16:42 [name] ([id number]) -------------------------------------------------------------------------------- Multi Wound Chart Details Patient Name: Gary Merlin A. Date of Service: 06/20/2020 3:30 PM Medical Record Number: [id number] Patient Account Number: [id number] Date of Birth/Sex: 19-Feb-1946 (75 y.o. Male) Treating RN: [name] Primary Care Patriciaann Rabanal: [name] Other Clinician: Referring Brice Potteiger: Clayborn Bigness Treating Raesean Bartoletti/Extender: [name]: 0 Vital Signs Height(in): 75 Pulse(bpm): 66 Weight(lbs): 239 Blood Pressure(mmHg): 145/90 Body Mass Index(BMI): 30 Temperature(F): 98.2 Respiratory  Rate(breaths/min): 18 Photos: [N/A:N/A] Wound Location: Left, Posterior Lower Leg N/A N/A Wounding Event: Trauma N/A N/A Primary Etiology: Trauma, Other N/A N/A Comorbid History: Cataracts, Coronary Artery Disease, N/A N/A Hypertension, Gout, Received Chemotherapy Date Acquired: 06/07/2020 N/A N/A Weeks of Treatment: 0 N/A N/A Wound Status: Open N/A N/A Measurements L x W x D (cm) 1.7x2x0.1 N/A N/A Area (cm) : 2.67 N/A N/A Volume (cm) : 0.267 N/A N/A % Reduction in Area: 0.00% N/A N/A % Reduction in Volume: 0.00% N/A N/A Classification: Full Thickness Without Exposed N/A N/A Support Structures Exudate Amount: Medium N/A N/A Exudate Type: Serosanguineous N/A N/A Exudate Color: red, brown N/A N/A Granulation Amount: Small (1-33%) N/A N/A Granulation Quality: Red N/A N/A Necrotic Amount: Large (67-100%) N/A N/A Necrotic Tissue: Eschar, Adherent Slough N/A N/A Exposed Structures: Fat Layer (Subcutaneous Tissue): N/A N/A Yes Fascia: No Tendon: No Muscle: No Joint: No Bone: No Epithelialization: None N/A N/A Debridement: Debridement - Excisional N/A N/A Pre-procedure Verification/Time 16:25 N/A N/A Out  Taken: Pain Control: Lidocaine 4% Topical Solution N/A N/A Tissue Debrided: Necrotic/Eschar, Subcutaneous, N/A N/A Slough Level: Skin/Subcutaneous Tissue N/A N/A Debridement Area (sq cm): 3.4 N/A N/A Instrument: Curette N/A N/A Bleeding: Minimum N/A N/A Debridement Treatment Procedure was tolerated well N/A N/A ResponseJAASIEL, [place]LLYFIELD ([id number]) Post Debridement 1.7x2x0.3 N/A N/A Measurements L x W x D (cm) Post Debridement Volume: 0.801 N/A N/A (cm) Procedures Performed: Debridement N/A N/A Treatment Notes Electronic Signature(s) Signed: 06/21/2020 12:09:37 PM By: Linton Ham MD Entered By: Linton Ham on 06/20/2020 16:38:26 [name] ([id number]) -------------------------------------------------------------------------------- Adell Details Patient Name: EMAAD, NANNA A. Date of Service: 06/20/2020 3:30 PM Medical Record Number: [id number] Patient Account Number: [id number] Date of Birth/Sex: [birth year] (75 y.o. Male) Treating RN: [name] Primary Care Clista Rainford: [name] Other Clinician: Referring Deshanae Lindo: Clayborn Bigness Treating Jataya Wann/Extender: [name]: 0 Active Inactive Orientation to the Wound Care Program Nursing Diagnoses: Knowledge deficit related to the wound healing center program Goals: Patient/caregiver will verbalize understanding of the [facility] Program Date Initiated: 06/20/2020 Target Resolution Date: 06/20/2020 Goal Status: Active Interventions: Provide education on orientation to the wound center Notes: Wound/Skin Impairment Nursing Diagnoses: Impaired tissue integrity Goals: Patient/caregiver will verbalize understanding of skin care regimen Date Initiated: 06/20/2020 Target Resolution Date: 06/20/2020 Goal Status: Active Ulcer/skin breakdown will have a volume reduction of 30% by week 4 Date Initiated: 06/20/2020 Target Resolution Date: 07/18/2020 Goal Status:  Active Ulcer/skin breakdown will have a volume reduction of 50% by week 8 Date Initiated: 06/20/2020 Target Resolution Date: 08/18/2020 Goal Status: Active Ulcer/skin breakdown will have a volume reduction of 80% by week 12 Date Initiated: 06/20/2020 Target Resolution Date: 09/17/2020 Goal Status: Active Interventions: Assess patient/caregiver ability to obtain necessary supplies Assess patient/caregiver ability to perform ulcer/skin care regimen upon admission and as needed Assess ulceration(s) every visit Provide education on ulcer and skin care Treatment Activities: Skin care regimen initiated : 06/20/2020 Topical wound management initiated : 06/20/2020 Notes: Electronic Signature(s) Signed: 06/21/2020 12:42:05 PM By: [name], [name] RN Entered By: [name], [name] on 06/20/2020 16:26:15 [name] ([id number]) -------------------------------------------------------------------------------- Pain Assessment Details Patient Name: Gary Merlin A. Date of Service: 06/20/2020 3:30 PM Medical Record Number: [id number] Patient Account Number: [id number] Date of Birth/Sex: January 05, 1946 (75 y.o. Male) Treating RN: [name] Primary Care Imogene Gravelle: [name] Other Clinician: Referring Amadi Frady: Clayborn Bigness Treating Jabes Primo/Extender: [name]: 0 Active Problems Location of Pain Severity and Description of Pain  Patient Has Paino No Site Locations Rate the pain. Current Pain Level: 0 Pain Management and Medication Current Pain Management: Electronic Signature(s) Signed: 06/21/2020 12:42:05 PM By: [name], [name] RN Entered By: [name], Kenia on 06/20/2020 15:51:04 [name] ([id number]) -------------------------------------------------------------------------------- Patient/Caregiver Education Details Patient Name: Gary Merlin A. Date of Service: 06/20/2020 3:30 PM Medical Record Number: [id number] Patient Account  Number: [id number] Date of Birth/Gender: 1945/11/24 (75 y.o. Male) Treating RN: [name] Primary Care Physician: [name] Other Clinician: Referring Physician: Clayborn Bigness Treating Physician/Extender: [name]: 0 Education Assessment Education Provided To: Patient Education Topics Provided Notes Educated on leg elevation when resting to decrease swelling Electronic Signature(s) Signed: 06/21/2020 12:42:05 PM By: [name], [name] RN Entered By: [name], [name] on 06/20/2020 16:33:49 [name] ([id number]) -------------------------------------------------------------------------------- Wound Assessment Details Patient Name: XZAVIAR, MALOOF A. Date of Service: 06/20/2020 3:30 PM Medical Record Number: [id number] Patient Account Number: [id number] Date of Birth/Sex: 08/04/1945 (75 y.o. Male) Treating RN: [name] Primary Care Kendrick Haapala: [name] Other Clinician: Referring Jujhar Everett: Clayborn Bigness Treating Deunte Bledsoe/Extender: [name]: 0 Wound Status Wound Number: 1 Primary Trauma, Other Etiology: Wound Location: Left, Posterior Lower Leg Wound Status: Open Wounding Event: Trauma Comorbid Cataracts, Coronary Artery Disease, Hypertension, Gout, Date Acquired: 06/07/2020 History: Received Chemotherapy Weeks Of Treatment: 0 Clustered Wound: No Photos Wound Measurements Length: (cm) 1.7 Width: (cm) 2 Depth: (cm) 0.1 Area: (cm) 2.67 Volume: (cm) 0.267 % Reduction in Area: 0% % Reduction in Volume: 0% Epithelialization: None Tunneling: No Undermining: No Wound Description Classification: Full Thickness Without Exposed Support Structu Exudate Amount: Medium Exudate Type: Serosanguineous Exudate Color: red, brown res Foul Odor After Cleansing: No Slough/Fibrino Yes Wound Bed Granulation Amount: Small (1-33%) Exposed Structure Granulation Quality: Red Fascia Exposed: No Necrotic Amount:  Large (67-100%) Fat Layer (Subcutaneous Tissue) Exposed: Yes Necrotic Quality: Eschar, Adherent Slough Tendon Exposed: No Muscle Exposed: No Joint Exposed: No Bone Exposed: No Electronic Signature(s) Signed: 06/21/2020 12:42:05 PM By: [name], [name] RN Entered By: [name], [name] on 06/20/2020 16:08:55 [name] ([id number]) -------------------------------------------------------------------------------- Vitals Details Patient Name: Gary Merlin A. Date of Service: 06/20/2020 3:30 PM Medical Record Number: [id number] Patient Account Number: [id number] Date of Birth/Sex: [birth year] (75 y.o. Male) Treating RN: [name] Primary Care Duward Allbritton: [name] Other Clinician: Referring Clinten Howk: Clayborn Bigness Treating Ernst Cumpston/Extender: [name]: 0 Vital Signs Time Taken: 16:04 Temperature (F): 98.2 Height (in): 75 Pulse (bpm): 66 Source: Stated Respiratory Rate (breaths/min): 18 Weight (lbs): 239 Blood Pressure (mmHg): 145/90 Source: Stated Reference Range: 80 - 120 mg / dl Body Mass Index (BMI): 29.9 Electronic Signature(s) Signed: 06/21/2020 12:42:05 PM By: [name], [name] RN Entered By: [name], [name] on 06/20/2020 81:01:75

## 2020-06-21 NOTE — Progress Notes (Signed)
[name], [name] ([id number]) Visit Report for 06/20/2020 Chief Complaint Document Details Patient Name: [name], [name]. Date of Service: 06/20/2020 3:30 PM Medical Record Number: [id number] Patient Account Number: [id number] Date of Birth/Sex: [birth year] (75 y.o. Male) Treating RN: [name] Primary Care Provider: [name] Other Clinician: Referring Provider: Clayborn Bigness Treating Provider/Extender: [name]: 0 Information Obtained from: Patient Chief Complaint 06/20/2020; patient admitted for review of a wound on his left posterior mid calf Electronic Signature(s) Signed: 06/21/2020 12:09:37 PM By: Linton Ham MD Entered By: Linton Ham on 06/20/2020 16:39:29 [name] ([id number]) -------------------------------------------------------------------------------- Debridement Details Patient Name: Gary Merlin A. Date of Service: 06/20/2020 3:30 PM Medical Record Number: [id number] Patient Account Number: [id number] Date of Birth/Sex: [birth year] (75 y.o. Male) Treating RN: [name] Primary Care Provider: [name] Other Clinician: Referring Provider: Clayborn Bigness Treating Provider/Extender: [name]: 0 Debridement Performed for Wound #1 Left,Posterior Lower Leg Assessment: Performed By: Physician [name], MD Debridement Type: Debridement Level of Consciousness (Pre- Awake and Alert procedure): Pre-procedure Verification/Time Out Yes - 16:25 Taken: Start Time: 16:25 Pain Control: Lidocaine 4% Topical Solution Total Area Debrided (L x W): 1.7 (cm) x 2 (cm) = 3.4 (cm) Tissue and other material Viable, Non-Viable, Eschar, Slough, Subcutaneous, Slough debrided: Level: Skin/Subcutaneous Tissue Debridement Description: Excisional Instrument: Curette Bleeding: Minimum Response to Treatment: Procedure was tolerated well Level of Consciousness (Post- Awake and Alert procedure): Post Debridement  Measurements of Total Wound Length: (cm) 1.7 Width: (cm) 2 Depth: (cm) 0.3 Volume: (cm) 0.801 Character of Wound/Ulcer Post Debridement: Stable Post Procedure Diagnosis Same as Pre-procedure Electronic Signature(s) Signed: 06/20/2020 9:26:32 PM By: [name], [name], RN, [name], [name] RN, [name] Signed: 06/21/2020 12:09:37 PM By: Linton Ham MD Entered By: Linton Ham on 06/20/2020 16:38:38 [name] ([id number]) -------------------------------------------------------------------------------- HPI Details Patient Name: [name], Gary A. Date of Service: 06/20/2020 3:30 PM Medical Record Number: [id number] Patient Account Number: [id number] Date of Birth/Sex: 03/16/1976 (75 y.o. 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 He is on his feet for long hours. He tells [place] that in early February he scratched himself with his fingernails on his posterior calf unbeknownst to him causing a wound. He was admitted to hospital from 06/11/2020 through 06/13/2020 with left leg cellulitis. Blood cultures were negative and ultrasound was negative for DVT and no abscess was found. He has completed antibiotics. He is using a Neosporin and a Band-Aid over this area. Past medical history includes type 2 diabetes, sick sinus syndrome, hypertension, coronary artery disease, pacemaker. He is not on oral anticoagulants ABI in our clinic was 1.26 on the left Electronic Signature(s) Signed: 06/21/2020 12:09:37 PM By: Linton Ham MD Entered By: Linton Ham on 06/20/2020 16:41:19 [name] ([id number]) -------------------------------------------------------------------------------- Physical Exam Details Patient Name:  [name], Gary A. Date of Service: 06/20/2020 3:30 PM Medical Record Number: [id number] Patient Account Number: [id number] Date of Birth/Sex: 1976/02/22 (75 y.o. Male) Treating RN: [name] Primary Care Provider: [name] Other Clinician: Referring Provider: Clayborn Bigness Treating Provider/Extender: [name]: 0 Constitutional Sitting or standing Blood Pressure is within target range for patient.. Pulse regular and within target range for patient.Marland Kitchen Respirations regular, non- labored and within target range.. Temperature  is normal and within the target range for the patient.Marland Kitchen appears in no distress. Cardiovascular Popliteal pulse was palpable on the left. Pedal pulses were palpable on the left. 2+ edema of the left leg. No warmth no evidence of a DVT. There is hemosiderin deposition anteriorly perhaps venous stasis. Integumentary (Hair, Skin) Skin changes of chronic venous insufficiency with hemosiderin deposition. Notes Wound exam; the area of questions on the left posterior calf. This is not that large however very adherent necrotic debris. I did as much of a debridement with a #3 curette that I could do when he could tolerate. But I still could not get down to a completely viable surface. There is significant tissue necrosis here which is going to require ongoing debridement Electronic Signature(s) Signed: 06/21/2020 12:09:37 PM By: Linton Ham MD Entered By: Linton Ham on 06/20/2020 16:42:59 [name] ([id number]) -------------------------------------------------------------------------------- Physician Orders Details Patient Name: Gary Merlin A. Date of Service: 06/20/2020 3:30 PM Medical Record Number: [id number] Patient Account Number: [id number] Date of Birth/Sex: 15-Jul-1975 (75 y.o. Male) Treating RN: [name] Primary Care Provider: [name] Other Clinician: Referring Provider: Clayborn Bigness Treating Provider/Extender: [name]: 0 Verbal / Phone Orders: No Diagnosis Coding Follow-up Appointments Wound #1 Left,Posterior Lower Leg o Return Appointment in 1 week. o Nurse Visit as needed Bathing/ Shower/ Hygiene Wound #1 Left,Posterior Lower Leg o Wash wounds with antibacterial soap and water. o May shower with wound dressing protected with water repellent cover or cast protector. Anesthetic (Use 'Patient Medications' Section for Anesthetic Order Entry) Wound #1 Left,Posterior Lower Leg o Lidocaine applied to wound bed o Benzocaine applied to wound bed. Edema Control - Lymphedema / Segmental Compressive Device / Other Left Lower Extremity o Optional: One layer of unna paste to top of compression wrap (to act as an anchor). o 3 Layer Compression System for Lymphedema. o Elevate, Exercise Daily and Avoid Standing for Long Periods of Time. o Elevate legs to the level of the heart and pump ankles as often as possible o Elevate leg(s) parallel to the floor when sitting. Wound Treatment Wound #1 - Lower Leg Wound Laterality: Left, Posterior Cleanser: Soap and Water 1 x Per Week/30 Days Discharge Instructions: Gently cleanse wound with antibacterial soap, rinse and pat dry prior to dressing wounds Primary Dressing: IODOFLEX 0.9% Cadexomer Iodine Pad 1 x Per Week/30 Days Discharge Instructions: Apply Iodoflex to wound bed only as directed. Secondary Dressing: ABD Pad 5x9 (in/in) 1 x Per Week/30 Days Discharge Instructions: Cover with ABD pad Compression Wrap: Profore Lite LF 3 Multilayer Compression Bandaging System 1 x Per Week/30 Days Discharge Instructions: Apply 3 multi-layer wrap as prescribed. Electronic Signature(s) Signed: 06/21/2020 12:09:37 PM By: Linton Ham MD Signed: 06/21/2020 12:42:05 PM By: [name], [name] RN Entered By: [name], [name] on 06/20/2020 16:31:36 [name]  ([id number]) -------------------------------------------------------------------------------- Problem List Details Patient Name: [name], Gary A. Date of Service: 06/20/2020 3:30 PM Medical Record Number: [id number] Patient Account Number: [id number] Date of Birth/Sex: December 14, 1945 (75 y.o. Male) Treating RN: [name] Primary Care Provider: [name] Other Clinician: Referring Provider: Clayborn Bigness Treating Provider/Extender: [name]: 0 Active Problems ICD-10 Encounter Code Description Active Date MDM Diagnosis S80.812D Abrasion, left lower leg, subsequent encounter 06/20/2020 No Yes L97.228 Non-pressure chronic ulcer of left calf with other specified severity 06/20/2020 No Yes I87.312 Chronic venous hypertension (idiopathic) with ulcer of left lower 06/20/2020 No Yes extremity Inactive Problems Resolved Problems Electronic Signature(s) Signed: 06/21/2020 12:09:37  PM By: Linton Ham MD Entered By: Linton Ham on 06/20/2020 16:38:18 [name] ([id number]) -------------------------------------------------------------------------------- Progress Note Details Patient Name: [name], Gary A. Date of Service: 06/20/2020 3:30 PM Medical Record Number: [id number] Patient Account Number: [id number] Date of Birth/Sex: 14-Mar-1946 (75 y.o. 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 He is on his feet for long hours. He tells [place] that in early February he scratched himself with his fingernails on his posterior calf unbeknownst to him  causing a wound. He was admitted to hospital from 06/11/2020 through 06/13/2020 with left leg cellulitis. Blood cultures were negative and ultrasound was negative for DVT and no abscess was found. He has completed antibiotics. He is using a Neosporin and a Band-Aid over this area. Past medical history includes type 2 diabetes, sick sinus syndrome, hypertension, coronary artery disease, pacemaker. He is not on oral anticoagulants ABI in our clinic was 1.26 on the left Patient History Information obtained from Patient. Allergies No Known Allergies Social History Never smoker, Marital Status - Divorced, Alcohol Use - Never, Drug Use - No History, Caffeine Use - Rarely. Medical History Eyes Patient has history of Cataracts Denies history of Glaucoma, Optic Neuritis Ear/Nose/Mouth/Throat Denies history of Chronic sinus problems/congestion, Middle ear problems Hematologic/Lymphatic Denies history of Anemia, Hemophilia, Human Immunodeficiency Virus, Lymphedema, Sickle Cell Disease Respiratory Denies history of Aspiration, Asthma, Chronic Obstructive Pulmonary Disease (COPD), Pneumothorax, Sleep Apnea, Tuberculosis Cardiovascular Patient has history of Coronary Artery Disease, Hypertension Denies history of Angina, Arrhythmia, Congestive Heart Failure, Hypotension, Myocardial Infarction, Peripheral Arterial Disease, Peripheral Venous Disease, Phlebitis, Vasculitis Gastrointestinal Denies history of Cirrhosis , Colitis, Crohn s, Hepatitis A, Hepatitis B, Hepatitis C Endocrine Denies history of Type I Diabetes, Type II Diabetes Genitourinary Denies history of End Stage Renal Disease Immunological Denies history of Lupus Erythematosus, Raynaud s, Scleroderma Integumentary (Skin) Denies history of History of Burn, History of pressure wounds Musculoskeletal Patient has history of Gout Denies history of Rheumatoid Arthritis, Osteoarthritis, Osteomyelitis Neurologic Denies history of Dementia,  Neuropathy, Quadriplegia, Paraplegia, Seizure Disorder Oncologic Patient has history of Received Chemotherapy Psychiatric Denies history of Anorexia/bulimia, Confinement Anxiety Medical And Surgical History Notes Oncologic [name], [name] ([id number]) prostate cancer Review of Systems (ROS) Constitutional Symptoms (General Health) Denies complaints or symptoms of Fatigue, Fever, Chills, Marked Weight Change. Eyes Denies complaints or symptoms of Dry Eyes, Vision Changes, Glasses / Contacts. Ear/Nose/Mouth/Throat Denies complaints or symptoms of Difficult clearing ears, Sinusitis. Hematologic/Lymphatic Denies complaints or symptoms of Bleeding / Clotting Disorders, Human Immunodeficiency Virus. Respiratory Denies complaints or symptoms of Chronic or frequent coughs, Shortness of Breath. Cardiovascular Complains or has symptoms of LE edema. Denies complaints or symptoms of Chest pain. Gastrointestinal Denies complaints or symptoms of Frequent diarrhea, Nausea, Vomiting. Endocrine Denies complaints or symptoms of Hepatitis, Thyroid disease, Polydypsia (Excessive Thirst). Genitourinary Denies complaints or symptoms of Kidney failure/ Dialysis, Incontinence/dribbling. Immunological Denies complaints or symptoms of Hives, Itching. Integumentary (Skin) Complains or has symptoms of Wounds, Swelling. Denies complaints or symptoms of Bleeding or bruising tendency, Breakdown. Musculoskeletal Denies complaints or symptoms of Muscle Pain, Muscle Weakness. Neurologic Denies complaints or symptoms of Numbness/parasthesias, Focal/Weakness. Psychiatric Denies complaints or symptoms  of Anxiety, Claustrophobia. Objective Constitutional Sitting or standing Blood Pressure is within target range for patient.. Pulse regular and within target range for patient.Marland Kitchen Respirations regular, non- labored and within target range.. Temperature is normal and within the target range for the patient.Marland Kitchen appears  in no distress. Vitals Time Taken: 4:04 PM, Height: 75 in, Source: Stated, Weight: 239 lbs, Source: Stated, BMI: 29.9, Temperature: 98.2 F, Pulse: 66 bpm, Respiratory Rate: 18 breaths/min, Blood Pressure: 145/90 mmHg. Cardiovascular Popliteal pulse was palpable on the left. Pedal pulses were palpable on the left. 2+ edema of the left leg. No warmth no evidence of a DVT. There is hemosiderin deposition anteriorly perhaps venous stasis. General Notes: Wound exam; the area of questions on the left posterior calf. This is not that large however very adherent necrotic debris. I did as much of a debridement with a #3 curette that I could do when he could tolerate. But I still could not get down to a completely viable surface. There is significant tissue necrosis here which is going to require ongoing debridement Integumentary (Hair, Skin) Skin changes of chronic venous insufficiency with hemosiderin deposition. Wound #1 status is Open. Original cause of wound was Trauma. The wound is located on the Left,Posterior Lower Leg. The wound measures 1.7cm length x 2cm width x 0.1cm depth; 2.67cm^2 area and 0.267cm^3 volume. There is Fat Layer (Subcutaneous Tissue) exposed. There is no tunneling or undermining noted. There is a medium amount of serosanguineous drainage noted. There is small (1-33%) red granulation within the wound bed. There is a large (67-100%) amount of necrotic tissue within the wound bed including Eschar and Adherent Slough. Assessment Active Problems ICD-10 [name], [name] ([id number]) Abrasion, left lower leg, subsequent encounter Non-pressure chronic ulcer of left calf with other specified severity Chronic venous hypertension (idiopathic) with ulcer of left lower extremity Procedures Wound #1 Pre-procedure diagnosis of Wound #1 is a Trauma, Other located on the Left,Posterior Lower Leg . There was a Excisional Skin/Subcutaneous Tissue Debridement with a total area of 3.4 sq cm  performed by [name], MD. With the following instrument(s): Curette to remove Viable and Non-Viable tissue/material. Material removed includes Eschar, Subcutaneous Tissue, and Slough after achieving pain control using Lidocaine 4% Topical Solution. A time out was conducted at 16:25, prior to the start of the procedure. A Minimum amount of bleeding was controlled with N/A. The procedure was tolerated well. Post Debridement Measurements: 1.7cm length x 2cm width x 0.3cm depth; 0.801cm^3 volume. Character of Wound/Ulcer Post Debridement is stable. Post procedure Diagnosis Wound #1: Same as Pre-Procedure Plan Follow-up Appointments: Wound #1 Left,Posterior Lower Leg: Return Appointment in 1 week. Nurse Visit as needed Bathing/ Shower/ Hygiene: Wound #1 Left,Posterior Lower Leg: Wash wounds with antibacterial soap and water. May shower with wound dressing protected with water repellent cover or cast protector. Anesthetic (Use 'Patient Medications' Section for Anesthetic Order Entry): Wound #1 Left,Posterior Lower Leg: Lidocaine applied to wound bed Benzocaine applied to wound bed. Edema Control - Lymphedema / Segmental Compressive Device / Other: Optional: One layer of unna paste to top of compression wrap (to act as an anchor). 3 Layer Compression System for Lymphedema. Elevate, Exercise Daily and Avoid Standing for Long Periods of Time. Elevate legs to the level of the heart and pump ankles as often as possible Elevate leg(s) parallel to the floor when sitting. WOUND #1: - Lower Leg Wound Laterality: Left, Posterior Cleanser: Soap and Water 1 x Per Week/30 Days Discharge Instructions: Gently cleanse wound with antibacterial  soap, rinse and pat dry prior to dressing wounds Primary Dressing: IODOFLEX 0.9% Cadexomer Iodine Pad 1 x Per Week/30 Days Discharge Instructions: Apply Iodoflex to wound bed only as directed. Secondary Dressing: ABD Pad 5x9 (in/in) 1 x Per Week/30  Days Discharge Instructions: Cover with ABD pad Compression Wrap: Profore Lite LF 3 Multilayer Compression Bandaging System 1 x Per Week/30 Days Discharge Instructions: Apply 3 multi-layer wrap as prescribed. 1. This is a difficult wound complicated by infection and tissue necrosis. He has been successfully treated for the cellulitis although there is still further debridement to be done 2. We used Iodoflex under compression 3 layer 3. I think this patient has chronic venous insufficiency which additionally complicates wound healing 4. I do not think there was any arterial issues here based on clinical exam and our ABIs 5. I did not think he needed any additional antibiotics 6. I told him that further debridement may be necessary. The surface of the wound is simply not conducive to closing this area at this point I spent 35 minutes in review of this patient's past medical history, face-to-face evaluation and preparation of this record Electronic Signature(s) Signed: 06/21/2020 12:09:37 PM By: Linton Ham MD Entered By: Linton Ham on 06/20/2020 16:45:30 [name] ([id number]) -------------------------------------------------------------------------------- ROS/PFSH Details Patient Name: Gary Merlin A. Date of Service: 06/20/2020 3:30 PM Medical Record Number: [id number] Patient Account Number: [id number] Date of Birth/Sex: February 19, 1946 (75 y.o. Male) Treating RN: [name] Primary Care Provider: [name] Other Clinician: Referring Provider: Clayborn Bigness Treating Provider/Extender: [name]: 0 Information Obtained From Patient Constitutional Symptoms (General Health) Complaints and Symptoms: Negative for: Fatigue; Fever; Chills; Marked Weight Change Eyes Complaints and Symptoms: Negative for: Dry Eyes; Vision Changes; Glasses / Contacts Medical History: Positive for: Cataracts Negative for: Glaucoma; Optic  Neuritis Ear/Nose/Mouth/Throat Complaints and Symptoms: Negative for: Difficult clearing ears; Sinusitis Medical History: Negative for: Chronic sinus problems/congestion; Middle ear problems Hematologic/Lymphatic Complaints and Symptoms: Negative for: Bleeding / Clotting Disorders; Human Immunodeficiency Virus Medical History: Negative for: Anemia; Hemophilia; Human Immunodeficiency Virus; Lymphedema; Sickle Cell Disease Respiratory Complaints and Symptoms: Negative for: Chronic or frequent coughs; Shortness of Breath Medical History: Negative for: Aspiration; Asthma; Chronic Obstructive Pulmonary Disease (COPD); Pneumothorax; Sleep Apnea; Tuberculosis Cardiovascular Complaints and Symptoms: Positive for: LE edema Negative for: Chest pain Medical History: Positive for: Coronary Artery Disease; Hypertension Negative for: Angina; Arrhythmia; Congestive Heart Failure; Hypotension; Myocardial Infarction; Peripheral Arterial Disease; Peripheral Venous Disease; Phlebitis; Vasculitis Gastrointestinal Complaints and Symptoms: Negative for: Frequent diarrhea; Nausea; Vomiting Medical History: Negative for: Cirrhosis ; Colitis; Crohnos; Hepatitis A; Hepatitis B; Hepatitis C Endocrine [name], Gary A. ([id number]) Complaints and Symptoms: Negative for: Hepatitis; Thyroid disease; Polydypsia (Excessive Thirst) Medical History: Negative for: Type I Diabetes; Type II Diabetes Genitourinary Complaints and Symptoms: Negative for: Kidney failure/ Dialysis; Incontinence/dribbling Medical History: Negative for: End Stage Renal Disease Immunological Complaints and Symptoms: Negative for: Hives; Itching Medical History: Negative for: Lupus Erythematosus; Raynaudos; Scleroderma Integumentary (Skin) Complaints and Symptoms: Positive for: Wounds; Swelling Negative for: Bleeding or bruising tendency; Breakdown Medical History: Negative for: History of Burn; History of pressure  wounds Musculoskeletal Complaints and Symptoms: Negative for: Muscle Pain; Muscle Weakness Medical History: Positive for: Gout Negative for: Rheumatoid Arthritis; Osteoarthritis; Osteomyelitis Neurologic Complaints and Symptoms: Negative for: Numbness/parasthesias; Focal/Weakness Medical History: Negative for: Dementia; Neuropathy; Quadriplegia; Paraplegia; Seizure Disorder Psychiatric Complaints and Symptoms: Negative for: Anxiety; Claustrophobia Medical History: Negative for: Anorexia/bulimia; Confinement Anxiety Oncologic Medical History: Positive for: Received Chemotherapy Past Medical  History Notes: prostate cancer HBO Extended History Items Eyes: Cataracts Immunizations Pneumococcal Vaccine: Received Pneumococcal Vaccination: No [name], AGUINO ([id number]) Implantable Devices Yes Family and Social History Never smoker; Marital Status - Divorced; Alcohol Use: Never; Drug Use: No History; Caffeine Use: Rarely Electronic Signature(s) Signed: 06/21/2020 12:09:37 PM By: Linton Ham MD Signed: 06/21/2020 12:42:05 PM By: [name], [name] RN Entered By: [name], [name] on 06/20/2020 15:57:54 [name] ([id number]) -------------------------------------------------------------------------------- SuperBill Details Patient Name: KENROY, TIMBERMAN A. Date of Service: 06/20/2020 Medical Record Number: [id number] Patient Account Number: [id number] Date of Birth/Sex: 20-Jan-1946 (75 y.o. Male) Treating RN: [name] Primary Care Provider: [name] Other Clinician: Referring Provider: Clayborn Bigness Treating Provider/Extender: [name]: 0 Diagnosis Coding ICD-10 Codes Code Description [phone] Abrasion, left lower leg, subsequent encounter L97.228 Non-pressure chronic ulcer of left calf with other specified severity I87.312 Chronic venous hypertension (idiopathic) with ulcer of left lower extremity Facility Procedures CPT4 Code:  15830940 Description: 76808 - WOUND CARE VISIT-LEV 3 EST PT Modifier: Quantity: 1 CPT4 Code: 81103159 Description: 45859 - DEB SUBQ TISSUE 20 SQ CM/< Modifier: Quantity: 1 CPT4 Code: Description: ICD-10 Diagnosis Description L97.228 Non-pressure chronic ulcer of left calf with other specified severity Modifier: Quantity: Physician Procedures CPT4 Code: 2924462 Description: WC PHYS LEVEL 3 o NEW PT Modifier: 25 Quantity: 1 CPT4 Code: Description: ICD-10 Diagnosis Description S80.812D Abrasion, left lower leg, subsequent encounter L97.228 Non-pressure chronic ulcer of left calf with other specified severity I87.312 Chronic venous hypertension (idiopathic) with ulcer of left lower  extremit Modifier: y Quantity: CPT4 Code: 8638177 Description: 11042 - WC PHYS SUBQ TISS 20 SQ CM Modifier: Quantity: 1 CPT4 Code: Description: ICD-10 Diagnosis Description L97.228 Non-pressure chronic ulcer of left calf with other specified severity Modifier: Quantity: Electronic Signature(s) Signed: 06/21/2020 12:09:37 PM By: Linton Ham MD Entered By: Linton Ham on 06/20/2020 16:46:10

## 2020-06-25 ENCOUNTER — Ambulatory Visit: Payer: Medicare Other | Admitting: Hospice and Palliative Medicine

## 2020-06-27 ENCOUNTER — Encounter: Payer: Medicare HMO | Admitting: Internal Medicine

## 2020-06-27 DIAGNOSIS — L97228 Non-pressure chronic ulcer of left calf with other specified severity: Secondary | ICD-10-CM | POA: Diagnosis not present

## 2020-06-27 DIAGNOSIS — L97222 Non-pressure chronic ulcer of left calf with fat layer exposed: Secondary | ICD-10-CM | POA: Diagnosis not present

## 2020-06-27 DIAGNOSIS — E11622 Type 2 diabetes mellitus with other skin ulcer: Secondary | ICD-10-CM | POA: Diagnosis not present

## 2020-06-27 DIAGNOSIS — Z950 Presence of cardiac pacemaker: Secondary | ICD-10-CM | POA: Diagnosis not present

## 2020-06-27 DIAGNOSIS — L03116 Cellulitis of left lower limb: Secondary | ICD-10-CM | POA: Diagnosis not present

## 2020-06-28 NOTE — Progress Notes (Signed)
ELIAV, MECHLING ([id number]) Visit Report for 06/27/2020 Debridement Details Patient Name: [name], [name]. Date of Service: 06/27/2020 3:30 PM Medical Record Number: [id number] Patient Account Number: [id number] Date of Birth/Sex: 1945-05-27 (75 y.o. M) Treating RN: [name] Primary Care Provider: [name] Other Clinician: Referring Provider: [name] Treating Provider/Extender: [name]: 1 Debridement Performed for Wound #1 Left,Posterior Lower Leg Assessment: Performed By: Physician [name], MD Debridement Type: Debridement Level of Consciousness (Pre- Awake and Alert procedure): Pre-procedure Verification/Time Out Yes - 15:45 Taken: Start Time: 15:45 Total Area Debrided (L x W): 1 (cm) x 1 (cm) = 1 (cm) Tissue and other material Viable, Non-Viable, Eschar, Slough, Subcutaneous, Slough debrided: Level: Skin/Subcutaneous Tissue Debridement Description: Excisional Instrument: Curette Bleeding: Minimum Hemostasis Achieved: Pressure Response to Treatment: Procedure was tolerated well Level of Consciousness (Post- Awake and Alert procedure): Post Debridement Measurements of Total Wound Length: (cm) 1.9 Width: (cm) 1.5 Depth: (cm) 0.2 Volume: (cm) 0.448 Character of Wound/Ulcer Post Debridement: Stable Post Procedure Diagnosis Same as Pre-procedure Electronic Signature(s) Signed: 06/27/2020 5:08:01 PM By: [name], [name], RN, [name], [name] RN, [name] Signed: 06/28/2020 12:48:36 PM By: Linton Ham MD Entered By: Linton Ham on 06/27/2020 15:52:22 [name] ([id number]) -------------------------------------------------------------------------------- HPI Details Patient Name: [name], [name] Date of Service: 06/27/2020 3:30 PM Medical Record Number: [id number] Patient Account Number: [id number] Date of Birth/Sex: Sep 26, 1945 (75 y.o. 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 He is on his feet for long hours. He tells [place] that in early February he scratched himself with his fingernails on his posterior calf unbeknownst to him causing a wound. He was admitted to hospital from 06/11/2020 through 06/13/2020 with left leg cellulitis. Blood cultures were negative and ultrasound was negative for DVT and no abscess was found. He has completed antibiotics. He is using a Neosporin and a Band-Aid over this area. Past medical history includes type 2 diabetes, sick sinus syndrome, hypertension, coronary artery disease, pacemaker. He is not on oral anticoagulants ABI in our clinic was 1.26 on the left 2/23; patient who presented to clinic last week having a minor trauma to his leg however he required admitting to the hospital for treatment of left leg cellulitis. Small wound on the left posterior calf however completely necrotic last week requiring an aggressive debridement. We put him in 3 layer compression with Iodoflex. He tolerated this well Electronic Signature(s) Signed: 06/28/2020 12:48:36 PM By: Linton Ham MD Entered By: Linton Ham on 06/27/2020 15:53:09 [name] ([id number]) -------------------------------------------------------------------------------- Physical Exam Details Patient Name: [name], [name] Date of Service: 06/27/2020 3:30 PM Medical Record Number: [id number] Patient Account Number: [id number] Date of Birth/Sex: Sep 24, 1945 (75 y.o. M) Treating RN: [name] Primary Care Provider: [name] Other Clinician: Referring Provider: [name] Treating Provider/Extender: [name]: 1 Constitutional Sitting or standing Blood Pressure is within target range for patient..  Pulse regular and within target range for patient.Marland Kitchen Respirations regular, non- labored and within target range.. Temperature is normal and within the target range for the patient.Marland Kitchen appears in no distress. Notes Wound exam; left posterior calf dime sized wound. Still necrotic debris but not nearly as much as last week and the surface area dimensions were better. I  tried to debride this further although it is very difficult to do. I was able to remove some necrotic debris but it was tolerated poorly. Hemostasis with direct pressure Electronic Signature(s) Signed: 06/28/2020 12:48:36 PM By: Linton Ham MD Entered By: Linton Ham on 06/27/2020 15:54:12 [name] ([id number]) -------------------------------------------------------------------------------- Physician Orders Details Patient Name: Gary Merlin A. Date of Service: 06/27/2020 3:30 PM Medical Record Number: [id number] Patient Account Number: [id number] Date of Birth/Sex: 08-13-1945 (75 y.o. M) Treating RN: [name] Primary Care Provider: [name] Other Clinician: Referring Provider: [name] Treating Provider/Extender: [name]: 1 Verbal / Phone Orders: No Diagnosis Coding Follow-up Appointments Wound #1 Left,Posterior Lower Leg o Return Appointment in 1 week. o Nurse Visit as needed Bathing/ Shower/ Hygiene Wound #1 Left,Posterior Lower Leg o Wash wounds with antibacterial soap and water. o May shower with wound dressing protected with water repellent cover or cast protector. Anesthetic (Use 'Patient Medications' Section for Anesthetic Order Entry) Wound #1 Left,Posterior Lower Leg o Lidocaine applied to wound bed o Benzocaine applied to wound bed. Edema Control - Lymphedema / Segmental Compressive Device / Other Left Lower Extremity o Optional: One layer of unna paste to top of compression wrap (to act as an anchor). o 3 Layer Compression System for  Lymphedema. o Elevate, Exercise Daily and Avoid Standing for Long Periods of Time. o Elevate legs to the level of the heart and pump ankles as often as possible o Elevate leg(s) parallel to the floor when sitting. Wound Treatment Wound #1 - Lower Leg Wound Laterality: Left, Posterior Cleanser: Soap and Water 1 x Per Week/30 Days Discharge Instructions: Gently cleanse wound with antibacterial soap, rinse and pat dry prior to dressing wounds Primary Dressing: IODOFLEX 0.9% Cadexomer Iodine Pad 1 x Per Week/30 Days Discharge Instructions: Apply Iodoflex to wound bed only as directed. Secondary Dressing: ABD Pad 5x9 (in/in) 1 x Per Week/30 Days Discharge Instructions: Cover with ABD pad Compression Wrap: Profore Lite LF 3 Multilayer Compression Bandaging System 1 x Per Week/30 Days Discharge Instructions: Apply 3 multi-layer wrap as prescribed. Electronic Signature(s) Signed: 06/27/2020 4:05:32 PM By: [name], [name] RN Signed: 06/28/2020 12:48:36 PM By: Linton Ham MD Entered By: [name], [name] on 06/27/2020 15:48:48 [name] ([id number]) -------------------------------------------------------------------------------- Problem List Details Patient Name: ZAXTON, ANGERER A. Date of Service: 06/27/2020 3:30 PM Medical Record Number: [id number] Patient Account Number: [id number] Date of Birth/Sex: May 04, 1946 (75 y.o. M) Treating RN: [name] Primary Care Provider: [name] Other Clinician: Referring Provider: [name] Treating Provider/Extender: [name]: 1 Active Problems ICD-10 Encounter Code Description Active Date MDM Diagnosis S80.812D Abrasion, left lower leg, subsequent encounter 06/20/2020 No Yes L97.228 Non-pressure chronic ulcer of left calf with other specified severity 06/20/2020 No Yes I87.312 Chronic venous hypertension (idiopathic) with ulcer of left lower 06/20/2020 No Yes extremity Inactive Problems Resolved  Problems Electronic Signature(s) Signed: 06/28/2020 12:48:36 PM By: Linton Ham MD Entered By: Linton Ham on 06/27/2020 15:52:02 [name] ([id number]) -------------------------------------------------------------------------------- Progress Note Details Patient Name: Gary Merlin A. Date of Service: 06/27/2020 3:30 PM Medical Record Number: [id number] Patient Account Number: [id number] Date of Birth/Sex: 1945-10-31 (75 y.o. 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 He is on his  feet for long hours. He tells [place] that in early February he scratched himself with his fingernails on his posterior calf unbeknownst to him causing a wound. He was admitted to hospital from 06/11/2020 through 06/13/2020 with left leg cellulitis. Blood cultures were negative and ultrasound was negative for DVT and no abscess was found. He has completed antibiotics. He is using a Neosporin and a Band-Aid over this area. Past medical history includes type 2 diabetes, sick sinus syndrome, hypertension, coronary artery disease, pacemaker. He is not on oral anticoagulants ABI in our clinic was 1.26 on the left 2/23; patient who presented to clinic last week having a minor trauma to his leg however he required admitting to the hospital for treatment of left leg cellulitis. Small wound on the left posterior calf however completely necrotic last week requiring an aggressive debridement. We put him in 3 layer compression with Iodoflex. He tolerated this well Objective Constitutional Sitting or standing Blood Pressure is within target range for patient.. Pulse regular and within target range for patient.Marland Kitchen Respirations regular,  non- labored and within target range.. Temperature is normal and within the target range for the patient.Marland Kitchen appears in no distress. Vitals Time Taken: 3:28 PM, Height: 75 in, Weight: 239 lbs, BMI: 29.9, Temperature: 97.6 F, Pulse: 77 bpm, Respiratory Rate: 16 breaths/min, Blood Pressure: 143/88 mmHg. General Notes: Wound exam; left posterior calf dime sized wound. Still necrotic debris but not nearly as much as last week and the surface area dimensions were better. I tried to debride this further although it is very difficult to do. I was able to remove some necrotic debris but it was tolerated poorly. Hemostasis with direct pressure Integumentary (Hair, Skin) Wound #1 status is Open. Original cause of wound was Skin Tear/Laceration. The date acquired was: 06/07/2020. The wound has been in treatment 1 weeks. The wound is located on the Left,Posterior Lower Leg. The wound measures 1.9cm length x 1.5cm width x 0.1cm depth; 2.238cm^2 area and 0.224cm^3 volume. There is Fat Layer (Subcutaneous Tissue) exposed. There is no tunneling or undermining noted. There is a medium amount of serosanguineous drainage noted. There is medium (34-66%) red, pink, hyper - granulation within the wound bed. There is a small (1-33%) amount of necrotic tissue within the wound bed including Eschar and Adherent Slough. Assessment Active Problems ICD-10 Abrasion, left lower leg, subsequent encounter Non-pressure chronic ulcer of left calf with other specified severity Chronic venous hypertension (idiopathic) with ulcer of left lower extremity ANGELOS, WASCO A. ([id number]) Procedures Wound #1 Pre-procedure diagnosis of Wound #1 is a Trauma, Other located on the Left,Posterior Lower Leg . There was a Excisional Skin/Subcutaneous Tissue Debridement with a total area of 1 sq cm performed by [name], MD. With the following instrument(s): Curette to remove Viable and Non-Viable tissue/material. Material removed  includes Eschar, Subcutaneous Tissue, and Slough. A time out was conducted at 15:45, prior to the start of the procedure. A Minimum amount of bleeding was controlled with Pressure. The procedure was tolerated well. Post Debridement Measurements: 1.9cm length x 1.5cm width x 0.2cm depth; 0.448cm^3 volume. Character of Wound/Ulcer Post Debridement is stable. Post procedure Diagnosis Wound #1: Same as Pre-Procedure Plan Follow-up Appointments: Wound #1 Left,Posterior Lower Leg: Return Appointment in 1 week. Nurse Visit as needed Bathing/ Shower/ Hygiene: Wound #1 Left,Posterior Lower Leg: Wash wounds with antibacterial soap and water. May shower with wound dressing protected with water repellent cover or cast protector. Anesthetic (Use 'Patient Medications' Section for Anesthetic Order Entry): Wound #1  Left,Posterior Lower Leg: Lidocaine applied to wound bed Benzocaine applied to wound bed. Edema Control - Lymphedema / Segmental Compressive Device / Other: Optional: One layer of unna paste to top of compression wrap (to act as an anchor). 3 Layer Compression System for Lymphedema. Elevate, Exercise Daily and Avoid Standing for Long Periods of Time. Elevate legs to the level of the heart and pump ankles as often as possible Elevate leg(s) parallel to the floor when sitting. WOUND #1: - Lower Leg Wound Laterality: Left, Posterior Cleanser: Soap and Water 1 x Per Week/30 Days Discharge Instructions: Gently cleanse wound with antibacterial soap, rinse and pat dry prior to dressing wounds Primary Dressing: IODOFLEX 0.9% Cadexomer Iodine Pad 1 x Per Week/30 Days Discharge Instructions: Apply Iodoflex to wound bed only as directed. Secondary Dressing: ABD Pad 5x9 (in/in) 1 x Per Week/30 Days Discharge Instructions: Cover with ABD pad Compression Wrap: Profore Lite LF 3 Multilayer Compression Bandaging System 1 x Per Week/30 Days Discharge Instructions: Apply 3 multi-layer wrap as  prescribed. 1. Continue Iodoflex under 3 layer compression 2. Small wound on the left posterior calf however developed significant tissue necrosis after infection. 3. The surface of the wound was better today however not completely viable. Careful attention to surface area measurements next time Electronic Signature(s) Signed: 06/28/2020 12:48:36 PM By: Linton Ham MD Entered By: Linton Ham on 06/27/2020 15:54:49 [name] ([id number]) -------------------------------------------------------------------------------- SuperBill Details Patient Name: Gary Merlin A. Date of Service: 06/27/2020 Medical Record Number: [id number] Patient Account Number: [id number] Date of Birth/Sex: Jul 19, 1945 (75 y.o. M) Treating RN: [name] Primary Care Provider: [name] Other Clinician: Referring Provider: [name] Treating Provider/Extender: [name]: 1 Diagnosis Coding ICD-10 Codes Code Description S80.812D Abrasion, left lower leg, subsequent encounter L97.228 Non-pressure chronic ulcer of left calf with other specified severity I87.312 Chronic venous hypertension (idiopathic) with ulcer of left lower extremity Facility Procedures CPT4 Code: [id number] Description: 09735 - DEB SUBQ TISSUE 20 SQ CM/< Modifier: Quantity: 1 CPT4 Code: Description: ICD-10 Diagnosis Description L97.228 Non-pressure chronic ulcer of left calf with other specified severity Modifier: Quantity: Physician Procedures CPT4 Code: 3299242 Description: 68341 - WC PHYS SUBQ TISS 20 SQ CM Modifier: Quantity: 1 CPT4 Code: Description: ICD-10 Diagnosis Description L97.228 Non-pressure chronic ulcer of left calf with other specified severity Modifier: Quantity: Electronic Signature(s) Signed: 06/28/2020 12:48:36 PM By: Linton Ham MD Entered By: Linton Ham on 06/27/2020 15:54:59

## 2020-06-29 ENCOUNTER — Other Ambulatory Visit: Payer: Self-pay | Admitting: Internal Medicine

## 2020-06-29 NOTE — Progress Notes (Signed)
Gary, Bishop ([id number]) Visit Report for 06/27/2020 Arrival Information Details Patient Name: [name], [name]. Date of Service: 06/27/2020 3:30 PM Medical Record Number: [id number] Patient Account Number: [id number] Date of Birth/Sex: Aug 20, 1945 (75 y.o. M) Treating RN: [name] Primary Care Nathen Balaban: [name] Other Clinician: Referring Breindel Collier: [name] Treating Ayumi Wangerin/Extender: [name]: 1 Visit Information History Since Last Visit Added or deleted any medications: No Patient Arrived: Ambulatory Had a fall or experienced change in No Arrival Time: 15:27 activities of daily living that may affect Accompanied By: self risk of falls: Transfer Assistance: None Hospitalized since last visit: No Patient Identification Verified: Yes Pain Present Now: No Electronic Signature(s) Signed: 06/29/2020 4:23:58 PM By: [name] Entered By: [name] on 06/27/2020 15:28:32 [name] ([id number]) -------------------------------------------------------------------------------- Clinic Level of Care Assessment Details Patient Name: [name]. Date of Service: 06/27/2020 3:30 PM Medical Record Number: [id number] Patient Account Number: [id number] Date of Birth/Sex: [birth year] (75 y.o. M) Treating RN: [name] Primary Care Makyna Niehoff: [name] Other Clinician: Referring Farmer Mccahill: [name] Treating Kieana Livesay/Extender: [name]: 1 Clinic Level of Care Assessment Items TOOL 1 Quantity Score []  - Use when EandM and Procedure is performed on INITIAL visit 0 ASSESSMENTS - Nursing Assessment / Reassessment []  - General Physical Exam (combine w/ comprehensive assessment (listed just below) when performed on new 0 pt. evals) []  - 0 Comprehensive Assessment (HX, ROS, Risk Assessments, Wounds Hx, etc.) ASSESSMENTS - Wound and Skin Assessment / Reassessment []  - Dermatologic / Skin Assessment (not  related to wound area) 0 ASSESSMENTS - Ostomy and/or Continence Assessment and Care []  - Incontinence Assessment and Management 0 []  - 0 Ostomy Care Assessment and Management (repouching, etc.) PROCESS - Coordination of Care []  - Simple Patient / Family Education for ongoing care 0 []  - 0 Complex (extensive) Patient / Family Education for ongoing care []  - 0 Staff obtains Consents, Records, Test Results / Process Orders []  - 0 Staff telephones HHA, Nursing Homes / Clarify orders / etc []  - 0 Routine Transfer to another Facility (non-emergent condition) []  - 0 Routine Hospital Admission (non-emergent condition) []  - 0 New Admissions / Biomedical engineer / Ordering NPWT, Apligraf, etc. []  - 0 Emergency Hospital Admission (emergent condition) PROCESS - Special Needs []  - Pediatric / Minor Patient Management 0 []  - 0 Isolation Patient Management []  - 0 Hearing / Language / Visual special needs []  - 0 Assessment of Community assistance (transportation, D/C planning, etc.) []  - 0 Additional assistance / Altered mentation []  - 0 Support Surface(s) Assessment (bed, cushion, seat, etc.) INTERVENTIONS - Miscellaneous []  - External ear exam 0 []  - 0 Patient Transfer (multiple staff / Civil Service fast streamer / Similar devices) []  - 0 Simple Staple / Suture removal (25 or less) []  - 0 Complex Staple / Suture removal (26 or more) []  - 0 Hypo/Hyperglycemic Management (do not check if billed separately) []  - 0 Ankle / Brachial Index (ABI) - do not check if billed separately Has the patient been seen at the hospital within the last three years: Yes Total Score: 0 Level Of Care: ____ [name] ([id number]) Electronic Signature(s) Signed: 06/27/2020 4:05:32 PM By: [name], [name] RN Entered By: [name], [name] on 06/27/2020 15:49:34 [name] ([id number]) -------------------------------------------------------------------------------- Encounter Discharge Information  Details Patient Name: Gary Merlin A. Date of Service: 06/27/2020 3:30 PM Medical Record Number: [id number] Patient Account Number: [id number] Date of Birth/Sex: [birth year] (75 y.o. M) Treating RN:  [name] Primary Care Theron Cumbie: [name] Other Clinician: Referring Paulla Mcclaskey: [name] Treating Jas Betten/Extender: [name]: 1 Encounter Discharge Information Items Post Procedure Vitals Discharge Condition: Stable Temperature (F): 97.6 Ambulatory Status: Ambulatory Pulse (bpm): 77 Discharge Destination: Home Respiratory Rate (breaths/min): 16 Transportation: Private Auto Blood Pressure (mmHg): 143/88 Accompanied By: self Schedule Follow-up Appointment: Yes Clinical Summary of Care: Electronic Signature(s) Signed: 06/27/2020 4:05:32 PM By: [name], [name] RN Entered By: [name], [name] on 06/27/2020 15:50:47 [name] ([id number]) -------------------------------------------------------------------------------- Lower Extremity Assessment Details Patient Name: Gary Merlin A. Date of Service: 06/27/2020 3:30 PM Medical Record Number: [id number] Patient Account Number: [id number] Date of Birth/Sex: 1945/09/22 (75 y.o. M) Treating RN: [name] Primary Care Dontrez Pettis: [name] Other Clinician: Referring Samin Milke: [name] Treating Guido Comp/Extender: [name]: 1 Edema Assessment Assessed: [Left: Yes] [Right: No] Edema: [Left: N] [Right: o] Calf Left: Right: Point of Measurement: 31 cm From Medial Instep 35.9 cm Ankle Left: Right: Point of Measurement: 10 cm From Medial Instep 24 cm Vascular Assessment Pulses: Dorsalis Pedis Palpable: [Left:Yes] Electronic Signature(s) Signed: 06/27/2020 5:08:01 PM By: [name], [name], RN, [name], [name] RN, [name] Signed: 06/29/2020 4:23:58 PM By: [name] Entered By: [name] on 06/27/2020 15:41:59 [name]  ([id number]) -------------------------------------------------------------------------------- Multi Wound Chart Details Patient Name: Gary Merlin A. Date of Service: 06/27/2020 3:30 PM Medical Record Number: [id number] Patient Account Number: [id number] Date of Birth/Sex: 1946-02-09 (75 y.o. M) Treating RN: [name] Primary Care Sheyli Horwitz: [name] Other Clinician: Referring Bud Kaeser: [name] Treating Leoma Folds/Extender: [name]: 1 Vital Signs Height(in): 75 Pulse(bpm): 9 Weight(lbs): 239 Blood Pressure(mmHg): 143/88 Body Mass Index(BMI): 30 Temperature(F): 97.6 Respiratory Rate(breaths/min): 16 Photos: [N/A:N/A] Wound Location: Left, Posterior Lower Leg N/A N/A Wounding Event: Skin Tear/Laceration N/A N/A Primary Etiology: Trauma, Other N/A N/A Comorbid History: Cataracts, Coronary Artery Disease, N/A N/A Hypertension, Gout, Received Chemotherapy Date Acquired: 06/07/2020 N/A N/A Weeks of Treatment: 1 N/A N/A Wound Status: Open N/A N/A Measurements L x W x D (cm) 1.9x1.5x0.1 N/A N/A Area (cm) : 2.238 N/A N/A Volume (cm) : 0.224 N/A N/A % Reduction in Area: 16.20% N/A N/A % Reduction in Volume: 16.10% N/A N/A Classification: Full Thickness Without Exposed N/A N/A Support Structures Exudate Amount: Medium N/A N/A Exudate Type: Serosanguineous N/A N/A Exudate Color: red, brown N/A N/A Granulation Amount: Medium (34-66%) N/A N/A Granulation Quality: Red, Pink, Hyper-granulation N/A N/A Necrotic Amount: Small (1-33%) N/A N/A Necrotic Tissue: Eschar, Adherent Slough N/A N/A Exposed Structures: Fat Layer (Subcutaneous Tissue): N/A N/A Yes Fascia: No Tendon: No Muscle: No Joint: No Bone: No Epithelialization: None N/A N/A Debridement: Debridement - Excisional N/A N/A Pre-procedure Verification/Time 15:45 N/A N/A Out Taken: Tissue Debrided: Necrotic/Eschar, Subcutaneous, N/A N/A Slough Level: Skin/Subcutaneous Tissue N/A  N/A Debridement Area (sq cm): 1 N/A N/A Instrument: Curette N/A N/A Bleeding: Minimum N/A N/A Hemostasis Achieved: Pressure N/A N/A Debridement Treatment Procedure was tolerated well N/A N/A ResponseELIOTT, AMPARAN ([id number]) Post Debridement 1.9x1.5x0.2 N/A N/A Measurements L x W x D (cm) Post Debridement Volume: 0.448 N/A N/A (cm) Procedures Performed: Debridement N/A N/A Treatment Notes Wound #1 (Lower Leg) Wound Laterality: Left, Posterior Cleanser Soap and Water Discharge Instruction: Gently cleanse wound with antibacterial soap, rinse and pat dry prior to dressing wounds Peri-Wound Care Topical Primary Dressing IODOFLEX 0.9% Cadexomer Iodine Pad Discharge Instruction: Apply Iodoflex to wound bed only as directed. Secondary Dressing ABD Pad 5x9 (in/in) Discharge Instruction: Cover with  ABD pad Secured With Compression Wrap Profore Lite LF 3 Multilayer Compression Bandaging System Discharge Instruction: Apply 3 multi-layer wrap as prescribed. Compression Stockings Add-Ons Electronic Signature(s) Signed: 06/28/2020 12:48:36 PM By: Linton Ham MD Entered By: Linton Ham on 06/27/2020 15:52:10 [name] ([id number]) -------------------------------------------------------------------------------- [place] Details Patient Name: KIVEN, VANGILDER A. Date of Service: 06/27/2020 3:30 PM Medical Record Number: [id number] Patient Account Number: [id number] Date of Birth/Sex: Jul 23, 1945 (75 y.o. M) Treating RN: [name] Primary Care Osie Amparo: [name] Other Clinician: Referring Rush Salce: [name] Treating Kehlani Vancamp/Extender: [name]: 1 Active Inactive Wound/Skin Impairment Nursing Diagnoses: Impaired tissue integrity Goals: Patient/caregiver will verbalize understanding of skin care regimen Date Initiated: 06/20/2020 Target Resolution Date: 06/20/2020 Goal Status: Active Ulcer/skin breakdown will  have a volume reduction of 30% by week 4 Date Initiated: 06/20/2020 Target Resolution Date: 07/18/2020 Goal Status: Active Ulcer/skin breakdown will have a volume reduction of 50% by week 8 Date Initiated: 06/20/2020 Target Resolution Date: 08/18/2020 Goal Status: Active Ulcer/skin breakdown will have a volume reduction of 80% by week 12 Date Initiated: 06/20/2020 Target Resolution Date: 09/17/2020 Goal Status: Active Interventions: Assess patient/caregiver ability to obtain necessary supplies Assess patient/caregiver ability to perform ulcer/skin care regimen upon admission and as needed Assess ulceration(s) every visit Provide education on ulcer and skin care Treatment Activities: Skin care regimen initiated : 06/20/2020 Topical wound management initiated : 06/20/2020 Notes: Electronic Signature(s) Signed: 06/27/2020 4:05:32 PM By: [name], [name] RN Entered By: [name], [name] on 06/27/2020 15:45:58 [name] ([id number]) -------------------------------------------------------------------------------- Pain Assessment Details Patient Name: Gary Merlin A. Date of Service: 06/27/2020 3:30 PM Medical Record Number: [id number] Patient Account Number: [id number] Date of Birth/Sex: 01-Apr-1946 (75 y.o. M) Treating RN: [name] Primary Care Kenyatte Chatmon: [name] Other Clinician: Referring Delmas Faucett: [name] Treating Obera Stauch/Extender: [name]: 1 Active Problems Location of Pain Severity and Description of Pain Patient Has Paino No Site Locations Pain Management and Medication Current Pain Management: Electronic Signature(s) Signed: 06/27/2020 5:08:01 PM By: [name], [name], RN, [name], [name] RN, [name] Signed: 06/29/2020 4:23:58 PM By: [name] Entered By: [name] on 06/27/2020 15:31:40 [name] ([id number]) -------------------------------------------------------------------------------- Patient/Caregiver Education  Details Patient Name: HUMBERT, MOROZOV A. Date of Service: 06/27/2020 3:30 PM Medical Record Number: [id number] Patient Account Number: [id number] Date of Birth/Gender: September 01, 1945 (75 y.o. M) Treating RN: [name] Primary Care Physician: [name] Other Clinician: Referring Physician: [name] Treating Physician/Extender: [name]: 1 Education Assessment Education Provided To: Patient Education Topics Provided Wound Debridement: Methods: Explain/Verbal Responses: State content correctly Electronic Signature(s) Signed: 06/27/2020 4:05:32 PM By: [name], [name] RN Entered By: [name], [name] on 06/27/2020 15:49:56 [name] ([id number]) -------------------------------------------------------------------------------- Wound Assessment Details Patient Name: PEARLEY, BARANEK A. Date of Service: 06/27/2020 3:30 PM Medical Record Number: [id number] Patient Account Number: [id number] Date of Birth/Sex: [birth year] (75 y.o. M) Treating RN: [name] Primary Care Koa Zoeller: [name] Other Clinician: Referring Chee Dimon: [name] Treating Franchot Pollitt/Extender: [name]: 1 Wound Status Wound Number: 1 Primary Trauma, Other Etiology: Wound Location: Left, Posterior Lower Leg Wound Status: Open Wounding Event: Skin Tear/Laceration Comorbid Cataracts, Coronary Artery Disease, Hypertension, Date Acquired: 06/07/2020 History: Gout, Received Chemotherapy Weeks Of Treatment: 1 Clustered Wound: No Photos Wound Measurements Length: (cm) 1.9 Width: (cm) 1.5 Depth: (cm) 0.1 Area: (cm) 2.238 Volume: (cm) 0.224 % Reduction in Area: 16.2% % Reduction in Volume: 16.1% Epithelialization: None Tunneling: No Undermining: No  Wound Description Classification: Full Thickness Without Exposed Support Structu Exudate Amount: Medium Exudate Type: Serosanguineous Exudate Color: red, brown res Foul Odor After  Cleansing: No Slough/Fibrino Yes Wound Bed Granulation Amount: Medium (34-66%) Exposed Structure Granulation Quality: Red, Pink, Hyper-granulation Fascia Exposed: No Necrotic Amount: Small (1-33%) Fat Layer (Subcutaneous Tissue) Exposed: Yes Necrotic Quality: Eschar, Adherent Slough Tendon Exposed: No Muscle Exposed: No Joint Exposed: No Bone Exposed: No Treatment Notes Wound #1 (Lower Leg) Wound Laterality: Left, Posterior Cleanser Soap and Water Discharge Instruction: Gently cleanse wound with antibacterial soap, rinse and pat dry prior to dressing wounds Peri-Wound Care TAJAE, RYBICKI A. ([id number]) Topical Primary Dressing IODOFLEX 0.9% Cadexomer Iodine Pad Discharge Instruction: Apply Iodoflex to wound bed only as directed. Secondary Dressing ABD Pad 5x9 (in/in) Discharge Instruction: Cover with ABD pad Secured With Compression Wrap Profore Lite LF 3 Multilayer Compression Bandaging System Discharge Instruction: Apply 3 multi-layer wrap as prescribed. Compression Stockings Environmental education officer) Signed: 06/27/2020 5:08:01 PM By: [name], [name], RN, [name], [name] RN, [name] Signed: 06/29/2020 4:23:58 PM By: [name] Entered By: [name] on 06/27/2020 15:41:42 [name] ([id number]) -------------------------------------------------------------------------------- Vitals Details Patient Name: JOELL, USMAN A. Date of Service: 06/27/2020 3:30 PM Medical Record Number: [id number] Patient Account Number: [id number] Date of Birth/Sex: 1946/02/04 (75 y.o. M) Treating RN: [name] Primary Care Lawton Dollinger: [name] Other Clinician: Referring Ethaniel Garfield: [name] Treating Consuelo Thayne/Extender: [name]: 1 Vital Signs Time Taken: 15:28 Temperature (F): 97.6 Height (in): 75 Pulse (bpm): 77 Weight (lbs): 239 Respiratory Rate (breaths/min): 16 Body Mass Index (BMI): 29.9 Blood Pressure (mmHg): 143/88 Reference Range: 80 - 120  mg / dl Electronic Signature(s) Signed: 06/29/2020 4:23:58 PM By: [name] Entered By: [name] on 06/27/2020 15:31:33

## 2020-07-04 ENCOUNTER — Encounter: Payer: Medicare HMO | Attending: Internal Medicine | Admitting: Internal Medicine

## 2020-07-04 ENCOUNTER — Other Ambulatory Visit: Payer: Self-pay

## 2020-07-04 DIAGNOSIS — L97228 Non-pressure chronic ulcer of left calf with other specified severity: Secondary | ICD-10-CM | POA: Insufficient documentation

## 2020-07-04 DIAGNOSIS — L03116 Cellulitis of left lower limb: Secondary | ICD-10-CM | POA: Insufficient documentation

## 2020-07-04 DIAGNOSIS — Z950 Presence of cardiac pacemaker: Secondary | ICD-10-CM | POA: Insufficient documentation

## 2020-07-04 DIAGNOSIS — E11622 Type 2 diabetes mellitus with other skin ulcer: Secondary | ICD-10-CM | POA: Insufficient documentation

## 2020-07-04 DIAGNOSIS — L97222 Non-pressure chronic ulcer of left calf with fat layer exposed: Secondary | ICD-10-CM | POA: Diagnosis not present

## 2020-07-05 ENCOUNTER — Other Ambulatory Visit: Payer: Self-pay | Admitting: Internal Medicine

## 2020-07-05 NOTE — Progress Notes (Signed)
MANDEL, SEIDEN ([id number]) Visit Report for 07/04/2020 Debridement Details Patient Name: [name], [name]. Date of Service: 07/04/2020 2:45 PM Medical Record Number: [id number] Patient Account Number: [id number] Date of Birth/Sex: November 12, 1945 (75 y.o. M) Treating RN: [name] Primary Care Provider: [name] Other Clinician: [name] Referring Provider: [name] Treating Provider/Extender: [name]: 2 Debridement Performed for Wound #1 Left,Posterior Lower Leg Assessment: Performed By: Physician [name], MD Debridement Type: Debridement Level of Consciousness (Pre- Awake and Alert procedure): Pre-procedure Verification/Time Out Yes - 15:44 Taken: Total Area Debrided (L x W): 1.5 (cm) x 1.4 (cm) = 2.1 (cm) Tissue and other material Slough, Subcutaneous, Biofilm, Slough debrided: Level: Skin/Subcutaneous Tissue Debridement Description: Excisional Instrument: Curette Bleeding: Moderate Hemostasis Achieved: Pressure Response to Treatment: Procedure was tolerated well Level of Consciousness (Post- Awake and Alert procedure): Post Debridement Measurements of Total Wound Length: (cm) 1.5 Width: (cm) 1.4 Depth: (cm) 0.2 Volume: (cm) 0.33 Character of Wound/Ulcer Post Debridement: Stable Post Procedure Diagnosis Same as Pre-procedure Electronic Signature(s) Signed: 07/04/2020 5:07:57 PM By: Linton Ham MD Signed: 07/04/2020 5:56:34 PM By: [name], [name], RN, [name], [name] RN, [name] Entered By: Linton Ham on 07/04/2020 16:09:30 [name] ([id number]) -------------------------------------------------------------------------------- HPI Details Patient Name: [name], Gary A. Date of Service: 07/04/2020 2:45 PM Medical Record Number: [id number] Patient Account Number: [id number] Date of Birth/Sex: November 26, 1945 (75 y.o. 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 He is on his feet for long hours. He tells [place] that in early February he scratched himself with his fingernails on his posterior calf unbeknownst to him causing a wound. He was admitted to hospital from 06/11/2020 through 06/13/2020 with left leg cellulitis. Blood cultures were negative and ultrasound was negative for DVT and no abscess was found. He has completed antibiotics. He is using a Neosporin and a Band-Aid over this area. Past medical history includes type 2 diabetes, sick sinus syndrome, hypertension, coronary artery disease, pacemaker. He is not on oral anticoagulants ABI in our clinic was 1.26 on the left 2/23; patient who presented to clinic last week having a minor trauma to his leg however he required admitting to the hospital for treatment of left leg cellulitis. Small wound on the left posterior calf however completely necrotic last week requiring an aggressive debridement. We put him in 3 layer compression with Iodoflex. He tolerated this well 3/2 patient continues to have a small wound on the left posterior calf however still with a mostly necrotic surface. Still requires debridement we have been using Iodoflex under compression. Original wound was minor trauma complicated by cellulitis earlier this month. Electronic Signature(s) Signed: 07/04/2020 5:07:57 PM By: Linton Ham MD Entered By: Linton Ham on 07/04/2020 16:10:49 [name] ([id number]) -------------------------------------------------------------------------------- Physical Exam Details Patient Name: [name], Gary A. Date of Service: 07/04/2020 2:45 PM Medical Record Number: [id number] Patient Account Number: [id number] Date of Birth/Sex: 1945-05-18 (75 y.o.  M) Treating RN: [name] Primary Care Provider: [name] Other Clinician: [name] Referring Provider: [name] Treating Provider/Extender: [name]: 2 Constitutional Sitting or standing Blood Pressure is within target range for patient.. Pulse regular and within target range for patient.Marland Kitchen Respirations regular, non- labored and within target range.. Temperature  is normal and within the target range for the patient.Marland Kitchen appears in no distress. Notes Wound exam; left posterior calf dime sized wound. Still mostly necrotic surface but better I still using #3 curette for debridement. The surface of this appears healthier. Inflammation around the wound is better. Electronic Signature(s) Signed: 07/04/2020 5:07:57 PM By: Linton Ham MD Entered By: Linton Ham on 07/04/2020 16:11:46 [name] ([id number]) -------------------------------------------------------------------------------- Physician Orders Details Patient Name: [name], Gary A. Date of Service: 07/04/2020 2:45 PM Medical Record Number: [id number] Patient Account Number: [id number] Date of Birth/Sex: 12/05/1945 (75 y.o. M) Treating RN: [name] Primary Care Provider: [name] Other Clinician: [name] Referring Provider: [name] Treating Provider/Extender: [name]: 2 Verbal / Phone Orders: No Diagnosis Coding Follow-up Appointments Wound #1 Left,Posterior Lower Leg o Return Appointment in 1 week. o Nurse Visit as needed Bathing/ Shower/ Hygiene Wound #1 Left,Posterior Lower Leg o Wash wounds with antibacterial soap and water. o May shower with wound dressing protected with water repellent cover or cast protector. Anesthetic (Use 'Patient Medications' Section for Anesthetic Order Entry) Wound #1 Left,Posterior Lower Leg o Lidocaine applied to wound bed o Benzocaine applied to wound bed. Edema Control -  Lymphedema / Segmental Compressive Device / Other Left Lower Extremity o Optional: One layer of unna paste to top of compression wrap (to act as an anchor). o 3 Layer Compression System for Lymphedema. o Elevate, Exercise Daily and Avoid Standing for Long Periods of Time. o Elevate legs to the level of the heart and pump ankles as often as possible o Elevate leg(s) parallel to the floor when sitting. Wound Treatment Wound #1 - Lower Leg Wound Laterality: Left, Posterior Cleanser: Soap and Water 1 x Per Week/30 Days Discharge Instructions: Gently cleanse wound with antibacterial soap, rinse and pat dry prior to dressing wounds Primary Dressing: IODOFLEX 0.9% Cadexomer Iodine Pad 1 x Per Week/30 Days Discharge Instructions: Apply Iodoflex to wound bed only as directed. Secondary Dressing: ABD Pad 5x9 (in/in) 1 x Per Week/30 Days Discharge Instructions: Cover with ABD pad Compression Wrap: Profore Lite LF 3 Multilayer Compression Bandaging System 1 x Per Week/30 Days Discharge Instructions: Apply 3 multi-layer wrap as prescribed. Electronic Signature(s) Signed: 07/04/2020 5:07:57 PM By: Linton Ham MD Signed: 07/04/2020 5:56:34 PM By: [name], [name], RN, [name], [name] RN, [name] Entered By: [name], [name], RN, [name], [name] on 07/04/2020 15:46:13 [name] ([id number]) -------------------------------------------------------------------------------- Problem List Details Patient Name: [name], [name]. Date of Service: 07/04/2020 2:45 PM Medical Record Number: [id number] Patient Account Number: [id number] Date of Birth/Sex: Oct 10, 1945 (75 y.o. M) Treating RN: [name] Primary Care Provider: [name] Other Clinician: [name] Referring Provider: [name] Treating Provider/Extender: [name]: 2 Active Problems ICD-10 Encounter Code Description Active Date MDM Diagnosis S80.812D Abrasion, left lower leg, subsequent encounter 06/20/2020 No Yes L97.228  Non-pressure chronic ulcer of left calf with other specified severity 06/20/2020 No Yes I87.312 Chronic venous hypertension (idiopathic) with ulcer of left lower 06/20/2020 No Yes extremity Inactive Problems Resolved Problems Electronic Signature(s) Signed: 07/04/2020 5:07:57 PM By: Linton Ham MD Entered By: Linton Ham on 07/04/2020 16:08:27 [name] ([id number]) -------------------------------------------------------------------------------- Progress Note Details Patient Name: Gary Merlin A. Date of Service: 07/04/2020 2:45 PM Medical Record Number: [id number] Patient Account Number: [id number] Date of Birth/Sex: 31-Jul-1945 (75 y.o. M) Treating RN: [name] Primary Care Provider: [name] Other Clinician: [name] Referring Provider: [name] Treating Provider/Extender: [name]:  2 Subjective History of Present Illness (HPI) ADMISSION 06/20/2020 This is a 75 year old man who still works 60 hours a week in the [employer]. He is on his feet for long hours. He tells [place] that in early February he scratched himself with his fingernails on his posterior calf unbeknownst to him causing a wound. He was admitted to hospital from 06/11/2020 through 06/13/2020 with left leg cellulitis. Blood cultures were negative and ultrasound was negative for DVT and no abscess was found. He has completed antibiotics. He is using a Neosporin and a Band-Aid over this area. Past medical history includes type 2 diabetes, sick sinus syndrome, hypertension, coronary artery disease, pacemaker. He is not on oral anticoagulants ABI in our clinic was 1.26 on the left 2/23; patient who presented to clinic last week having a minor trauma to his leg however he required admitting to the hospital for treatment of left leg cellulitis. Small wound on the left posterior calf however completely necrotic last week requiring an aggressive debridement. We put him  in 3 layer compression with Iodoflex. He tolerated this well 3/2 patient continues to have a small wound on the left posterior calf however still with a mostly necrotic surface. Still requires debridement we have been using Iodoflex under compression. Original wound was minor trauma complicated by cellulitis earlier this month. Objective Constitutional Sitting or standing Blood Pressure is within target range for patient.. Pulse regular and within target range for patient.Marland Kitchen Respirations regular, non- labored and within target range.. Temperature is normal and within the target range for the patient.Marland Kitchen appears in no distress. Vitals Time Taken: 2:55 PM, Height: 75 in, Weight: 239 lbs, BMI: 29.9, Temperature: 97.8 F, Pulse: 71 bpm, Respiratory Rate: 16 breaths/min, Blood Pressure: 144/83 mmHg. General Notes: Wound exam; left posterior calf dime sized wound. Still mostly necrotic surface but better I still using #3 curette for debridement. The surface of this appears healthier. Inflammation around the wound is better. Integumentary (Hair, Skin) Wound #1 status is Open. Original cause of wound was Skin Tear/Laceration. The date acquired was: 06/07/2020. The wound has been in treatment 2 weeks. The wound is located on the Left,Posterior Lower Leg. The wound measures 1.5cm length x 1.4cm width x 0.1cm depth; 1.649cm^2 area and 0.165cm^3 volume. There is Fat Layer (Subcutaneous Tissue) exposed. There is no tunneling or undermining noted. There is a medium amount of serosanguineous drainage noted. There is medium (34-66%) red, pink, hyper - granulation within the wound bed. There is a small (1-33%) amount of necrotic tissue within the wound bed including Adherent Slough. Assessment Active Problems ICD-10 Abrasion, left lower leg, subsequent encounter Non-pressure chronic ulcer of left calf with other specified severity Chronic venous hypertension (idiopathic) with ulcer of left lower extremity GIACOMO, VALONE A. ([id number]) Procedures Wound #1 Pre-procedure diagnosis of Wound #1 is a Trauma, Other located on the Left,Posterior Lower Leg . There was a Excisional Skin/Subcutaneous Tissue Debridement with a total area of 2.1 sq cm performed by [name], MD. With the following instrument(s): Curette Material removed includes Subcutaneous Tissue, Slough, and Biofilm. No specimens were taken. A time out was conducted at 15:44, prior to the start of the procedure. A Moderate amount of bleeding was controlled with Pressure. The procedure was tolerated well. Post Debridement Measurements: 1.5cm length x 1.4cm width x 0.2cm depth; 0.33cm^3 volume. Character of Wound/Ulcer Post Debridement is stable. Post procedure Diagnosis Wound #1: Same as Pre-Procedure Pre-procedure diagnosis of Wound #1 is a Trauma, Other located on the  Left,Posterior Lower Leg . There was a Three Layer Compression Therapy Procedure with a pre-treatment ABI of 1.3 by [name], RN. Post procedure Diagnosis Wound #1: Same as Pre-Procedure Plan Follow-up Appointments: Wound #1 Left,Posterior Lower Leg: Return Appointment in 1 week. Nurse Visit as needed Bathing/ Shower/ Hygiene: Wound #1 Left,Posterior Lower Leg: Wash wounds with antibacterial soap and water. May shower with wound dressing protected with water repellent cover or cast protector. Anesthetic (Use 'Patient Medications' Section for Anesthetic Order Entry): Wound #1 Left,Posterior Lower Leg: Lidocaine applied to wound bed Benzocaine applied to wound bed. Edema Control - Lymphedema / Segmental Compressive Device / Other: Optional: One layer of unna paste to top of compression wrap (to act as an anchor). 3 Layer Compression System for Lymphedema. Elevate, Exercise Daily and Avoid Standing for Long Periods of Time. Elevate legs to the level of the heart and pump ankles as often as possible Elevate leg(s) parallel to the floor when sitting. WOUND #1: - Lower  Leg Wound Laterality: Left, Posterior Cleanser: Soap and Water 1 x Per Week/30 Days Discharge Instructions: Gently cleanse wound with antibacterial soap, rinse and pat dry prior to dressing wounds Primary Dressing: IODOFLEX 0.9% Cadexomer Iodine Pad 1 x Per Week/30 Days Discharge Instructions: Apply Iodoflex to wound bed only as directed. Secondary Dressing: ABD Pad 5x9 (in/in) 1 x Per Week/30 Days Discharge Instructions: Cover with ABD pad Compression Wrap: Profore Lite LF 3 Multilayer Compression Bandaging System 1 x Per Week/30 Days Discharge Instructions: Apply 3 multi-layer wrap as prescribed. 1. Still Iodoflex under compression. 2. Hopefully can switch to [facility] soon Electronic Signature(s) Signed: 07/04/2020 5:07:57 PM By: Linton Ham MD Entered By: Linton Ham on 07/04/2020 16:12:10 [name] ([id number]) -------------------------------------------------------------------------------- SuperBill Details Patient Name: Gary Merlin A. Date of Service: 07/04/2020 Medical Record Number: [id number] Patient Account Number: [id number] Date of Birth/Sex: 1946-04-18 (75 y.o. M) Treating RN: [name] Primary Care Provider: [name] Other Clinician: [name] Referring Provider: [name] Treating Provider/Extender: [name]: 2 Diagnosis Coding ICD-10 Codes Code Description [phone] Abrasion, left lower leg, subsequent encounter L97.228 Non-pressure chronic ulcer of left calf with other specified severity I87.312 Chronic venous hypertension (idiopathic) with ulcer of left lower extremity Facility Procedures CPT4 Code: [id number] Description: 69678 - DEB SUBQ TISSUE 20 SQ CM/< Modifier: Quantity: 1 CPT4 Code: Description: ICD-10 Diagnosis Description S80.812D Abrasion, left lower leg, subsequent encounter L97.228 Non-pressure chronic ulcer of left calf with other specified severity I87.312 Chronic venous hypertension  (idiopathic) with ulcer of left lower  extre Modifier: mity Quantity: Physician Procedures CPT4 Code: 9381017 Description: 51025 - WC PHYS SUBQ TISS 20 SQ CM Modifier: Quantity: 1 CPT4 Code: Description: ICD-10 Diagnosis Description S80.812D Abrasion, left lower leg, subsequent encounter L97.228 Non-pressure chronic ulcer of left calf with other specified severity I87.312 Chronic venous hypertension (idiopathic) with ulcer of left lower  extre Modifier: mity Quantity: Electronic Signature(s) Signed: 07/04/2020 5:07:57 PM By: Linton Ham MD Entered By: Linton Ham on 07/04/2020 16:12:27

## 2020-07-06 NOTE — Progress Notes (Signed)
Gary, Bishop ([id number]) Visit Report for 07/04/2020 Arrival Information Details Patient Name: Gary, Bishop. Date of Service: 07/04/2020 2:45 PM Medical Record Number: [id number] Patient Account Number: [id number] Date of Birth/Sex: [birth year] (75 y.o. M) Treating RN: [name] Primary Care Crystalynn Mcinerney: [name] Other Clinician: [name] Referring Joselynne Killam: [name] Treating Lynnsey Barbara/Extender: [name]: 2 Visit Information History Since Last Visit Added or deleted any medications: No Patient Arrived: Ambulatory Had a fall or experienced change in No Arrival Time: 14:55 activities of daily living that may affect Accompanied By: self risk of falls: Transfer Assistance: None Hospitalized since last visit: No Patient Identification Verified: Yes Pain Present Now: No Secondary Verification Process Completed: Yes Electronic Signature(s) Signed: 07/05/2020 1:24:27 PM By: [name] Entered By: [name] on 07/04/2020 14:55:33 [name] ([id number]) -------------------------------------------------------------------------------- Compression Therapy Details Patient Name: Gary Merlin A. Date of Service: 07/04/2020 2:45 PM Medical Record Number: [id number] Patient Account Number: [id number] Date of Birth/Sex: Oct 06, 1945 (75 y.o. M) Treating RN: [name] Primary Care Ege Muckey: [name] Other Clinician: [name] Referring Taiga Lupinacci: [name] Treating Delina Kruczek/Extender: [name]: 2 Compression Therapy Performed for Wound Assessment: Wound #1 Left,Posterior Lower Leg Performed By: Clinician [name], RN Compression Type: Three Layer Pre Treatment ABI: 1.3 Post Procedure Diagnosis Same as Pre-procedure Electronic Signature(s) Signed: 07/04/2020 5:56:34 PM By: [name], [name], RN, [name], [name] RN, [name] Entered By: [name], [name], RN, [name], [name] on 07/04/2020 15:45:44 [name]  ([id number]) -------------------------------------------------------------------------------- Encounter Discharge Information Details Patient Name: [name], Gary A. Date of Service: 07/04/2020 2:45 PM Medical Record Number: [id number] Patient Account Number: [id number] Date of Birth/Sex: 1946/04/09 (75 y.o. M) Treating RN: [name] Primary Care Avis Mcmahill: [name] Other Clinician: [name] Referring Jheri Mitter: [name] Treating Keondre Markson/Extender: [name]: 2 Encounter Discharge Information Items Post Procedure Vitals Discharge Condition: Stable Temperature (F): 97.8 Ambulatory Status: Ambulatory Pulse (bpm): 71 Discharge Destination: Home Respiratory Rate (breaths/min): 16 Transportation: Private Auto Blood Pressure (mmHg): 144/83 Accompanied By: self Schedule Follow-up Appointment: Yes Clinical Summary of Care: Patient Declined Electronic Signature(s) Signed: 07/06/2020 8:47:47 AM By: Carlene Coria RN Entered By: Carlene Coria on 07/04/2020 16:04:11 [name] ([id number]) -------------------------------------------------------------------------------- Lower Extremity Assessment Details Patient Name: Gary Merlin A. Date of Service: 07/04/2020 2:45 PM Medical Record Number: [id number] Patient Account Number: [id number] Date of Birth/Sex: 1945/09/13 (75 y.o. M) Treating RN: [name] Primary Care Maxyne Derocher: [name] Other Clinician: [name] Referring Dreyson Mishkin: [name] Treating Miyani Cronic/Extender: [name]: 2 Edema Assessment Assessed: [Left: Yes] [Right: No] [Left: Edema] [Right: :] Calf Left: Right: Point of Measurement: 31 cm From Medial Instep 34.9 cm Ankle Left: Right: Point of Measurement: 10 cm From Medial Instep 22.6 cm Vascular Assessment Pulses: Dorsalis Pedis Palpable: [Left:Yes] Electronic Signature(s) Signed: 07/04/2020 5:56:34 PM By: [name], [name], RN, [name], [name] RN,  [name] Signed: 07/05/2020 1:24:27 PM By: [name] Entered By: [name] on 07/04/2020 15:10:28 [name] ([id number]) -------------------------------------------------------------------------------- Multi Wound Chart Details Patient Name: [name], Gary A. Date of Service: 07/04/2020 2:45 PM Medical Record Number: [id number] Patient Account Number: [id number] Date of Birth/Sex: November 01, 1945 (75 y.o. M) Treating RN: [name] Primary Care Syble Picco: [name] Other Clinician: [name] Referring Ivianna Notch: [name] Treating Keimani Laufer/Extender: [name]: 2 Vital Signs Height(in): 75 Pulse(bpm): 71 Weight(lbs): 239 Blood Pressure(mmHg): 144/83 Body Mass Index(BMI): 30 Temperature(F): 97.8 Respiratory Rate(breaths/min): 16 Photos: [N/A:N/A] Wound Location: Left, Posterior  Lower Leg N/A N/A Wounding Event: Skin Tear/Laceration N/A N/A Primary Etiology: Trauma, Other N/A N/A Comorbid History: Cataracts, Coronary Artery Disease, N/A N/A Hypertension, Gout, Received Chemotherapy Date Acquired: 06/07/2020 N/A N/A Weeks of Treatment: 2 N/A N/A Wound Status: Open N/A N/A Measurements L x W x D (cm) 1.5x1.4x0.1 N/A N/A Area (cm) : 1.649 N/A N/A Volume (cm) : 0.165 N/A N/A % Reduction in Area: 38.20% N/A N/A % Reduction in Volume: 38.20% N/A N/A Classification: Full Thickness Without Exposed N/A N/A Support Structures Exudate Amount: Medium N/A N/A Exudate Type: Serosanguineous N/A N/A Exudate Color: red, brown N/A N/A Granulation Amount: Medium (34-66%) N/A N/A Granulation Quality: Red, Pink, Hyper-granulation N/A N/A Necrotic Amount: Small (1-33%) N/A N/A Exposed Structures: Fat Layer (Subcutaneous Tissue): N/A N/A Yes Fascia: No Tendon: No Muscle: No Joint: No Bone: No Epithelialization: None N/A N/A Debridement: Debridement - Excisional N/A N/A Pre-procedure Verification/Time 15:44 N/A N/A Out Taken: Tissue Debrided:  Subcutaneous N/A N/A Level: Skin/Subcutaneous Tissue N/A N/A Debridement Area (sq cm): 2.1 N/A N/A Instrument: Curette N/A N/A Bleeding: Moderate N/A N/A Hemostasis Achieved: Pressure N/A N/A Debridement Treatment Procedure was tolerated well N/A N/A Response: Post Debridement 1.5x1.4x0.2 N/A N/A Measurements L x W x D (cm) [name], [name] ([id number]) Post Debridement Volume: 0.33 N/A N/A (cm) Procedures Performed: Compression Therapy N/A N/A Debridement Treatment Notes Wound #1 (Lower Leg) Wound Laterality: Left, Posterior Cleanser Soap and Water Discharge Instruction: Gently cleanse wound with antibacterial soap, rinse and pat dry prior to dressing wounds Peri-Wound Care Topical Primary Dressing IODOFLEX 0.9% Cadexomer Iodine Pad Discharge Instruction: Apply Iodoflex to wound bed only as directed. Secondary Dressing ABD Pad 5x9 (in/in) Discharge Instruction: Cover with ABD pad Secured With Compression Wrap Profore Lite LF 3 Multilayer Compression Bandaging System Discharge Instruction: Apply 3 multi-layer wrap as prescribed. Compression Stockings Add-Ons Electronic Signature(s) Signed: 07/04/2020 5:07:57 PM By: Linton Ham MD Entered By: Linton Ham on 07/04/2020 16:08:34 [name] ([id number]) -------------------------------------------------------------------------------- [place] Details Patient Name: IZACK, HOOGLAND A. Date of Service: 07/04/2020 2:45 PM Medical Record Number: [id number] Patient Account Number: [id number] Date of Birth/Sex: August 02, 1945 (75 y.o. M) Treating RN: [name] Primary Care Bayler Nehring: [name] Other Clinician: [name] Referring Karlene Southard: [name] Treating Carrell Rahmani/Extender: [name]: 2 Active Inactive Necrotic Tissue Nursing Diagnoses: Impaired tissue integrity related to necrotic/devitalized tissue Knowledge deficit related to management of necrotic/devitalized  tissue Goals: Necrotic/devitalized tissue will be minimized in the wound bed Date Initiated: 07/04/2020 Target Resolution Date: 07/11/2020 Goal Status: Active Patient/caregiver will verbalize understanding of reason and process for debridement of necrotic tissue Date Initiated: 07/04/2020 Target Resolution Date: 07/11/2020 Goal Status: Active Interventions: Assess patient pain level pre-, during and post procedure and prior to discharge Provide education on necrotic tissue and debridement process Treatment Activities: Excisional debridement : 07/04/2020 Notes: Wound/Skin Impairment Nursing Diagnoses: Impaired tissue integrity Goals: Patient/caregiver will verbalize understanding of skin care regimen Date Initiated: 06/20/2020 Target Resolution Date: 06/20/2020 Goal Status: Active Ulcer/skin breakdown will have a volume reduction of 30% by week 4 Date Initiated: 06/20/2020 Target Resolution Date: 07/18/2020 Goal Status: Active Ulcer/skin breakdown will have a volume reduction of 50% by week 8 Date Initiated: 06/20/2020 Target Resolution Date: 08/18/2020 Goal Status: Active Ulcer/skin breakdown will have a volume reduction of 80% by week 12 Date Initiated: 06/20/2020 Target Resolution Date: 09/17/2020 Goal Status: Active Interventions: Assess patient/caregiver ability to obtain necessary supplies Assess patient/caregiver ability to perform ulcer/skin care regimen upon admission and as needed  Assess ulceration(s) every visit Provide education on ulcer and skin care Treatment Activities: Skin care regimen initiated : 06/20/2020 Topical wound management initiated : 06/20/2020 Notes: [name], [name] ([id number]) Electronic Signature(s) Signed: 07/04/2020 5:56:34 PM By: [name], [name], RN, [name], [name] RN, [name] Entered By: [name], [name], RN, [name], [name] on 07/04/2020 15:43:47 [name] ([id number]) -------------------------------------------------------------------------------- Pain Assessment  Details Patient Name: [name], Gary A. Date of Service: 07/04/2020 2:45 PM Medical Record Number: [id number] Patient Account Number: [id number] Date of Birth/Sex: June 19, 1945 (75 y.o. M) Treating RN: [name] Primary Care Plez Belton: [name] Other Clinician: [name] Referring Vesta Wheeland: [name] Treating Dionisia Pacholski/Extender: [name]: 2 Active Problems Location of Pain Severity and Description of Pain Patient Has Paino No Site Locations Pain Management and Medication Current Pain Management: Electronic Signature(s) Signed: 07/04/2020 5:56:34 PM By: [name], [name], RN, [name], [name] RN, [name] Signed: 07/05/2020 1:24:27 PM By: [name] Entered By: [name] on 07/04/2020 14:57:34 [name] ([id number]) -------------------------------------------------------------------------------- Patient/Caregiver Education Details Patient Name: DEVANTE, CAPANO A. Date of Service: 07/04/2020 2:45 PM Medical Record Number: [id number] Patient Account Number: [id number] Date of Birth/Gender: Oct 25, 1945 (75 y.o. M) Treating RN: [name] Primary Care Physician: [name] Other Clinician: [name] Referring Physician: [name] Treating Physician/Extender: [name]: 2 Education Assessment Education Provided To: Patient Education Topics Provided Wound Debridement: Handouts: Wound Debridement Methods: Demonstration, Explain/Verbal Responses: State content correctly Wound/Skin Impairment: Handouts: Caring for Your Ulcer Methods: Demonstration, Explain/Verbal Responses: State content correctly Electronic Signature(s) Signed: 07/04/2020 5:56:34 PM By: [name], [name], RN, [name], [name] RN, [name] Entered By: [name], [name], RN, [name], [name] on 07/04/2020 15:47:16 [name] ([id number]) -------------------------------------------------------------------------------- Wound Assessment Details Patient Name: [name], Gary A. Date of  Service: 07/04/2020 2:45 PM Medical Record Number: [id number] Patient Account Number: [id number] Date of Birth/Sex: 13-Apr-1946 (75 y.o. M) Treating RN: [name] Primary Care Yazlin Ekblad: [name] Other Clinician: [name] Referring Davionna Blacksher: [name] Treating Talmage Teaster/Extender: [name]: 2 Wound Status Wound Number: 1 Primary Trauma, Other Etiology: Wound Location: Left, Posterior Lower Leg Wound Status: Open Wounding Event: Skin Tear/Laceration Comorbid Cataracts, Coronary Artery Disease, Hypertension, Date Acquired: 06/07/2020 History: Gout, Received Chemotherapy Weeks Of Treatment: 2 Clustered Wound: No Photos Wound Measurements Length: (cm) 1.5 Width: (cm) 1.4 Depth: (cm) 0.1 Area: (cm) 1.649 Volume: (cm) 0.165 % Reduction in Area: 38.2% % Reduction in Volume: 38.2% Epithelialization: None Tunneling: No Undermining: No Wound Description Classification: Full Thickness Without Exposed Support Structu Exudate Amount: Medium Exudate Type: Serosanguineous Exudate Color: red, brown res Foul Odor After Cleansing: No Slough/Fibrino Yes Wound Bed Granulation Amount: Medium (34-66%) Exposed Structure Granulation Quality: Red, Pink, Hyper-granulation Fascia Exposed: No Necrotic Amount: Small (1-33%) Fat Layer (Subcutaneous Tissue) Exposed: Yes Necrotic Quality: Adherent Slough Tendon Exposed: No Muscle Exposed: No Joint Exposed: No Bone Exposed: No Treatment Notes Wound #1 (Lower Leg) Wound Laterality: Left, Posterior Cleanser Soap and Water Discharge Instruction: Gently cleanse wound with antibacterial soap, rinse and pat dry prior to dressing wounds Peri-Wound Care [name], [name] A. ([id number]) Topical Primary Dressing IODOFLEX 0.9% Cadexomer Iodine Pad Discharge Instruction: Apply Iodoflex to wound bed only as directed. Secondary Dressing ABD Pad 5x9 (in/in) Discharge Instruction: Cover with ABD pad Secured  With Compression Wrap Profore Lite LF 3 Multilayer Compression Bandaging System Discharge Instruction: Apply 3 multi-layer wrap as prescribed. Compression Stockings Add-Ons Electronic Signature(s) Signed: 07/04/2020 5:56:34 PM By: [name], [name], RN, [name], [name] RN, [name] Signed: 07/05/2020 1:24:27 PM By:  [name] Entered By: [name] on 07/04/2020 Gary ([id number]) -------------------------------------------------------------------------------- Vitals Details Patient Name: Gary Merlin A. Date of Service: 07/04/2020 2:45 PM Medical Record Number: [id number] Patient Account Number: [id number] Date of Birth/Sex: 1946/01/16 (75 y.o. M) Treating RN: [name] Primary Care Jorrell Kuster: [name] Other Clinician: [name] Referring Urian Martenson: [name] Treating Genecis Veley/Extender: [name]: 2 Vital Signs Time Taken: 14:55 Temperature (F): 97.8 Height (in): 75 Pulse (bpm): 71 Weight (lbs): 239 Respiratory Rate (breaths/min): 16 Body Mass Index (BMI): 29.9 Blood Pressure (mmHg): 144/83 Reference Range: 80 - 120 mg / dl Electronic Signature(s) Signed: 07/05/2020 1:24:27 PM By: [name] Entered By: [name] on 07/04/2020 14:57:26

## 2020-07-11 ENCOUNTER — Other Ambulatory Visit: Payer: Self-pay

## 2020-07-11 ENCOUNTER — Encounter: Payer: Medicare HMO | Admitting: Physician Assistant

## 2020-07-11 DIAGNOSIS — E11622 Type 2 diabetes mellitus with other skin ulcer: Secondary | ICD-10-CM | POA: Diagnosis not present

## 2020-07-11 DIAGNOSIS — L03116 Cellulitis of left lower limb: Secondary | ICD-10-CM | POA: Diagnosis not present

## 2020-07-11 DIAGNOSIS — L97228 Non-pressure chronic ulcer of left calf with other specified severity: Secondary | ICD-10-CM | POA: Diagnosis not present

## 2020-07-11 DIAGNOSIS — L97222 Non-pressure chronic ulcer of left calf with fat layer exposed: Secondary | ICD-10-CM | POA: Diagnosis not present

## 2020-07-11 DIAGNOSIS — Z950 Presence of cardiac pacemaker: Secondary | ICD-10-CM | POA: Diagnosis not present

## 2020-07-11 NOTE — Progress Notes (Addendum)
ARNALDO, HEFFRON ([id number]) Visit Report for 07/11/2020 Chief Complaint Document Details Patient Name: [name], [name]. Date of Service: 07/11/2020 8:15 AM Medical Record Number: [id number] Patient Account Number: [id number] Date of Birth/Sex: December 07, 1945 (75 y.o. M) Treating RN: [name] Primary Care Provider: [name] Other Clinician: [name] Referring Provider: [name] Treating Provider/Extender: Skipper Cliche in Treatment: 3 Information Obtained from: Patient Chief Complaint 06/20/2020; patient admitted for review of a wound on his left posterior mid calf Electronic Signature(s) Signed: 07/11/2020 8:37:47 AM By: Worthy Keeler PA-C Entered By: Worthy Keeler on 07/11/2020 08:37:47 [name] ([id number]) -------------------------------------------------------------------------------- Debridement Details Patient Name: Gary Merlin A. Date of Service: 07/11/2020 8:15 AM Medical Record Number: [id number] Patient Account Number: [id number] Date of Birth/Sex: March 14, 1946 (75 y.o. M) Treating RN: [name] Primary Care Provider: [name] Other Clinician: [name] Referring Provider: [name] Treating Provider/Extender: Skipper Cliche in Treatment: 3 Debridement Performed for Wound #1 Left,Posterior Lower Leg Assessment: Performed By: Physician Tommie Sams., PA-C Debridement Type: Debridement Level of Consciousness (Pre- Awake and Alert procedure): Pre-procedure Verification/Time Out Yes - 08:42 Taken: Total Area Debrided (L x W): 1.5 (cm) x 1.3 (cm) = 1.95 (cm) Tissue and other material Viable, Non-Viable, Slough, Subcutaneous, Slough debrided: Level: Skin/Subcutaneous Tissue Debridement Description: Excisional Instrument: Curette Bleeding: Moderate Hemostasis Achieved: Pressure Response to Treatment: Procedure was tolerated well Level of Consciousness (Post- Awake and Alert procedure): Post Debridement Measurements of  Total Wound Length: (cm) 1.5 Width: (cm) 1.3 Depth: (cm) 0.2 Volume: (cm) 0.306 Character of Wound/Ulcer Post Debridement: Stable Post Procedure Diagnosis Same as Pre-procedure Electronic Signature(s) Signed: 07/11/2020 5:32:24 PM By: [name], [name], RN, [name], [name] RN, [name] Signed: 07/11/2020 6:23:15 PM By: Worthy Keeler PA-C Entered By: [name], [name], RN, [name], [name] on 07/11/2020 08:48:21 [name] ([id number]) -------------------------------------------------------------------------------- HPI Details Patient Name: [name], Gary A. Date of Service: 07/11/2020 8:15 AM Medical Record Number: [id number] Patient Account Number: [id number] Date of Birth/Sex: 11-06-1945 (75 y.o. 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 He is on his feet for long hours. He tells [place] that in early February he scratched himself with his fingernails on his posterior calf unbeknownst to him causing a wound. He was admitted to hospital from 06/11/2020 through 06/13/2020 with left leg cellulitis. Blood cultures were negative and ultrasound was negative for DVT and no abscess was found. He has completed antibiotics. He is using a Neosporin and a Band-Aid over this area. Past medical history includes type 2 diabetes, sick sinus syndrome, hypertension, coronary artery disease, pacemaker. He is not on oral anticoagulants ABI in our clinic was 1.26 on the left 2/23; patient who presented to clinic last week having a minor trauma to his leg however he required admitting to the hospital for treatment of left leg cellulitis. Small wound on the left posterior calf however completely necrotic last week requiring an aggressive  debridement. We put him in 3 layer compression with Iodoflex. He tolerated this well 3/2 patient continues to have a small wound on the left posterior calf however still with a mostly necrotic surface. Still requires debridement we have been using Iodoflex under compression. Original wound was minor trauma complicated by cellulitis earlier this month. 07/11/2020 upon evaluation today  patient appears to be doing well with regard to his wound on the posterior leg. We have been using Iodoflex which I do feel like it is helpful for him. Is helping to clean up the wound bed. With that being said there is little bit of irritation around the edges of the wound I think the possible using a little Desitin here will help protect that region. Overall I think the cellulitis has improved and that is doing quite well. My main issue that I see is just that of slough buildup in the middle portion of the wound. Electronic Signature(s) Signed: 07/11/2020 8:46:18 AM By: Worthy Keeler PA-C Entered By: Worthy Keeler on 07/11/2020 08:46:18 [name] ([id number]) -------------------------------------------------------------------------------- Physical Exam Details Patient Name: [name], Gary A. Date of Service: 07/11/2020 8:15 AM Medical Record Number: [id number] Patient Account Number: [id number] Date of Birth/Sex: 1945-12-05 (75 y.o. M) Treating RN: [name] Primary Care Provider: [name] Other Clinician: [name] Referring Provider: [name] Treating Provider/Extender: Skipper Cliche in Treatment: 3 Constitutional Well-nourished and well-hydrated in no acute distress. Respiratory normal breathing without difficulty. Psychiatric this patient is able to make decisions and demonstrates good insight into disease process. Alert and Oriented x 3. pleasant and cooperative. Notes Patient's wound bed actually showed signs of good granulation at this time. There does not appear to be any  significant slough buildup that there was a small area that would not have to clear away. I did work on this in order to clear that away so that we can get the wound moving in a better direction. I think the Iodoflex is the right dressing to continue with currently. Electronic Signature(s) Signed: 07/11/2020 8:46:40 AM By: Worthy Keeler PA-C Entered By: Worthy Keeler on 07/11/2020 08:46:39 [name] ([id number]) -------------------------------------------------------------------------------- Physician Orders Details Patient Name: [name], Gary A. Date of Service: 07/11/2020 8:15 AM Medical Record Number: [id number] Patient Account Number: [id number] Date of Birth/Sex: August 31, 1945 (75 y.o. M) Treating RN: [name] Primary Care Provider: [name] Other Clinician: [name] Referring Provider: [name] Treating Provider/Extender: Skipper Cliche in Treatment: 3 Verbal / Phone Orders: No Diagnosis Coding ICD-10 Coding Code Description S80.812D Abrasion, left lower leg, subsequent encounter L97.228 Non-pressure chronic ulcer of left calf with other specified severity I87.312 Chronic venous hypertension (idiopathic) with ulcer of left lower extremity Follow-up Appointments Wound #1 Left,Posterior Lower Leg o Return Appointment in 1 week. o Nurse Visit as needed Bathing/ Shower/ Hygiene Wound #1 Left,Posterior Lower Leg o Wash wounds with antibacterial soap and water. o May shower with wound dressing protected with water repellent cover or cast protector. Anesthetic (Use 'Patient Medications' Section for Anesthetic Order Entry) Wound #1 Left,Posterior Lower Leg o Lidocaine applied to wound bed Edema Control - Lymphedema / Segmental Compressive Device / Other Left Lower Extremity o Optional: One layer of unna paste to top of compression wrap (to act as an anchor). o 3 Layer Compression System for Lymphedema. o Elevate, Exercise Daily and Avoid  Standing for Long Periods of Time. o Elevate legs to the level of the heart and pump ankles as often as possible o Elevate leg(s) parallel to the floor when sitting. Wound Treatment Wound #1 - Lower Leg Wound Laterality: Left, Posterior Peri-Wound Care: Desitin Maximum Strength Ointment 4 (oz) Primary Dressing: IODOFLEX 0.9% Cadexomer Iodine Pad Discharge Instructions: Apply Iodoflex to wound bed only as directed. Secondary Dressing: ABD Pad 5x9 (in/in) Discharge Instructions: Cover with ABD pad Compression Wrap: Profore Lite LF  3 Multilayer Compression Bandaging System Discharge Instructions: Apply 3 multi-layer wrap as prescribed. Electronic Signature(s) Signed: 07/11/2020 5:32:24 PM By: [name], [name], RN, [name], [name] RN, [name] Signed: 07/11/2020 6:23:15 PM By: Worthy Keeler PA-C Entered By: [name] [name], RN, [name], [name] on 07/11/2020 08:48:36 [name] ([id number]) -------------------------------------------------------------------------------- Problem List Details Patient Name: [name], NASTASI. Date of Service: 07/11/2020 8:15 AM Medical Record Number: [id number] Patient Account Number: [id number] Date of Birth/Sex: Oct 03, 1945 (75 y.o. M) Treating RN: [name] Primary Care Provider: [name] Other Clinician: [name] Referring Provider: [name] Treating Provider/Extender: Skipper Cliche in Treatment: 3 Active Problems ICD-10 Encounter Code Description Active Date MDM Diagnosis S80.812D Abrasion, left lower leg, subsequent encounter 06/20/2020 No Yes L97.228 Non-pressure chronic ulcer of left calf with other specified severity 06/20/2020 No Yes I87.312 Chronic venous hypertension (idiopathic) with ulcer of left lower 06/20/2020 No Yes extremity Inactive Problems Resolved Problems Electronic Signature(s) Signed: 07/11/2020 8:37:40 AM By: Worthy Keeler PA-C Entered By: Worthy Keeler on 07/11/2020 08:37:40 [name]  ([id number]) -------------------------------------------------------------------------------- Progress Note Details Patient Name: Gary Merlin A. Date of Service: 07/11/2020 8:15 AM Medical Record Number: [id number] Patient Account Number: [id number] Date of Birth/Sex: Jul 03, 1945 (75 y.o. 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 He is on his feet for long hours. He tells [place] that in early February he scratched himself with his fingernails on his posterior calf unbeknownst to him causing a wound. He was admitted to hospital from 06/11/2020 through 06/13/2020 with left leg cellulitis. Blood cultures were negative and ultrasound was negative for DVT and no abscess was found. He has completed antibiotics. He is using a Neosporin and a Band-Aid over this area. Past medical history includes type 2 diabetes, sick sinus syndrome, hypertension, coronary artery disease, pacemaker. He is not on oral anticoagulants ABI in our clinic was 1.26 on the left 2/23; patient who presented to clinic last week having a minor trauma to his leg however he required admitting to the hospital for treatment of left leg cellulitis. Small wound on the left posterior calf however completely necrotic last week requiring an aggressive debridement. We put him in 3 layer compression with Iodoflex. He tolerated this well 3/2 patient continues to have a small wound on the left posterior calf however still with a mostly necrotic surface. Still requires debridement we have been using Iodoflex under  compression. Original wound was minor trauma complicated by cellulitis earlier this month. 07/11/2020 upon evaluation today patient appears to be doing well with regard to his wound on the posterior leg. We have been using Iodoflex which I do feel like it is helpful for him. Is helping to clean up the wound bed. With that being said there is little bit of irritation around the edges of the wound I think the possible using a little Desitin here will help protect that region. Overall I think the cellulitis has improved and that is doing quite well. My main issue that I see is just that of slough buildup in the middle portion of the wound. Objective Constitutional Well-nourished and well-hydrated in no acute distress. Vitals Time Taken:  8:20 AM, Height: 75 in, Weight: 239 lbs, BMI: 29.9, Temperature: 97.8 F, Pulse: 79 bpm, Respiratory Rate: 18 breaths/min, Blood Pressure: 170/109 mmHg. Respiratory normal breathing without difficulty. Psychiatric this patient is able to make decisions and demonstrates good insight into disease process. Alert and Oriented x 3. pleasant and cooperative. General Notes: Patient's wound bed actually showed signs of good granulation at this time. There does not appear to be any significant slough buildup that there was a small area that would not have to clear away. I did work on this in order to clear that away so that we can get the wound moving in a better direction. I think the Iodoflex is the right dressing to continue with currently. Integumentary (Hair, Skin) Wound #1 status is Open. Original cause of wound was Skin Tear/Laceration. The date acquired was: 06/07/2020. The wound has been in treatment 3 weeks. The wound is located on the Left,Posterior Lower Leg. The wound measures 1.5cm length x 1.3cm width x 0.1cm depth; 1.532cm^2 area and 0.153cm^3 volume. There is Fat Layer (Subcutaneous Tissue) exposed. There is no tunneling or undermining noted. There is a  medium amount of serosanguineous drainage noted. There is medium (34-66%) red, hyper - granulation within the wound bed. There is a small (1-33%) amount of necrotic tissue within the wound bed including Adherent Slough. [name], [name] ([id number]) Assessment Active Problems ICD-10 Abrasion, left lower leg, subsequent encounter Non-pressure chronic ulcer of left calf with other specified severity Chronic venous hypertension (idiopathic) with ulcer of left lower extremity Procedures Wound #1 Pre-procedure diagnosis of Wound #1 is a Trauma, Other located on the Left,Posterior Lower Leg . There was a Excisional Skin/Subcutaneous Tissue Debridement with a total area of 1.95 sq cm performed by Tommie Sams., PA-C. With the following instrument(s): Curette to remove Viable and Non-Viable tissue/material. Material removed includes Subcutaneous Tissue and Slough and. No specimens were taken. A time out was conducted at 08:42, prior to the start of the procedure. A Moderate amount of bleeding was controlled with Pressure. The procedure was tolerated well. Post Debridement Measurements: 1.5cm length x 1.3cm width x 0.2cm depth; 0.306cm^3 volume. Character of Wound/Ulcer Post Debridement is stable. Post procedure Diagnosis Wound #1: Same as Pre-Procedure Plan Follow-up Appointments: Wound #1 Left,Posterior Lower Leg: Return Appointment in 1 week. Nurse Visit as needed Bathing/ Shower/ Hygiene: Wound #1 Left,Posterior Lower Leg: Wash wounds with antibacterial soap and water. May shower with wound dressing protected with water repellent cover or cast protector. Anesthetic (Use 'Patient Medications' Section for Anesthetic Order Entry): Wound #1 Left,Posterior Lower Leg: Lidocaine applied to wound bed Edema Control - Lymphedema / Segmental Compressive Device / Other: Optional: One layer of unna paste to top of compression wrap (to act as an anchor). 3 Layer Compression System for  Lymphedema. Elevate, Exercise Daily and Avoid Standing for Long Periods of Time. Elevate legs to the level of the heart and pump ankles as often as possible Elevate leg(s) parallel to the floor when sitting. WOUND #1: - Lower Leg Wound Laterality: Left, Posterior Peri-Wound Care: Desitin Maximum Strength Ointment 4 (oz) Primary Dressing: IODOFLEX 0.9% Cadexomer Iodine Pad Discharge Instructions: Apply Iodoflex to wound bed only as directed. Secondary Dressing: ABD Pad 5x9 (in/in) Discharge Instructions: Cover with ABD pad Compression Wrap: Profore Lite LF 3 Multilayer Compression Bandaging System Discharge Instructions: Apply 3 multi-layer wrap as prescribed. 1. Would recommend currently that we go ahead and continue with the Iodoflex I think that is a good option here and will  continue to debride the wound as well. 2. I am also can recommend that we have the patient continue to monitor for any signs of worsening infection such as increased pain the right now everything appears to be doing excellent. 3. We will also continue with the 3 layer compression wrap which I think has been helpful for him. We will see patient back for reevaluation in 1 week here in the clinic. If anything worsens or changes patient will contact our office for additional recommendations. Electronic Signature(s) TOREN, [name] ([id number]) Signed: 07/11/2020 8:47:07 AM By: Worthy Keeler PA-C Entered By: Worthy Keeler on 07/11/2020 08:47:07 [name], [name] ([id number]) -------------------------------------------------------------------------------- SuperBill Details Patient Name: [name], [name] A. Date of Service: 07/11/2020 Medical Record Number: [id number] Patient Account Number: [id number] Date of Birth/Sex: 1945-05-26 (75 y.o. M) Treating RN: [name] Primary Care Provider: [name] Other Clinician: [name] Referring Provider: [name] Treating Provider/Extender: Skipper Cliche in  Treatment: 3 Diagnosis Coding ICD-10 Codes Code Description [phone] Abrasion, left lower leg, subsequent encounter L97.228 Non-pressure chronic ulcer of left calf with other specified severity I87.312 Chronic venous hypertension (idiopathic) with ulcer of left lower extremity Facility Procedures CPT4 Code: [id number] Description: 76226 - DEB SUBQ TISSUE 20 SQ CM/< Modifier: Quantity: 1 CPT4 Code: Description: ICD-10 Diagnosis Description L97.228 Non-pressure chronic ulcer of left calf with other specified severity Modifier: Quantity: Physician Procedures CPT4 Code: [id number] Description: 25638 - WC PHYS SUBQ TISS 20 SQ CM Modifier: Quantity: 1 CPT4 Code: Description: ICD-10 Diagnosis Description L97.228 Non-pressure chronic ulcer of left calf with other specified severity Modifier: Quantity: Electronic Signature(s) Signed: 07/11/2020 8:50:36 AM By: Worthy Keeler PA-C Entered By: Worthy Keeler on 07/11/2020 08:50:36

## 2020-07-11 NOTE — Progress Notes (Signed)
DEVONTAE, CASASOLA ([id number]) Visit Report for 07/11/2020 Arrival Information Details Patient Name: [name], [name]. Date of Service: 07/11/2020 8:15 AM Medical Record Number: [id number] Patient Account Number: [id number] Date of Birth/Sex: [birth year] (75 y.o. M) Treating RN: [name] Primary Care Renley Gutman: [name] Other Clinician: [name] Referring Qais Jowers: [name] Treating Marykay Mccleod/Extender: Skipper Cliche in Treatment: 3 Visit Information History Since Last Visit Added or deleted any medications: No Patient Arrived: Ambulatory Had a fall or experienced change in No Arrival Time: 08:18 activities of daily living that may affect Accompanied By: self risk of falls: Transfer Assistance: None Hospitalized since last visit: No Patient Identification Verified: Yes Pain Present Now: Yes Secondary Verification Process Completed: Yes Electronic Signature(s) Signed: 07/11/2020 3:31:48 PM By: [name] Entered By: [name] on 07/11/2020 08:19:22 [name] ([id number]) -------------------------------------------------------------------------------- Encounter Discharge Information Details Patient Name: Gary Merlin A. Date of Service: 07/11/2020 8:15 AM Medical Record Number: [id number] Patient Account Number: [id number] Date of Birth/Sex: 05-Aug-1945 (75 y.o. M) Treating RN: [name] Primary Care Anetria Harwick: [name] Other Clinician: [name] Referring Keita Demarco: [name] Treating Johny Pitstick/Extender: Skipper Cliche in Treatment: 3 Encounter Discharge Information Items Post Procedure Vitals Discharge Condition: Stable Temperature (F): 97.8 Ambulatory Status: Ambulatory Pulse (bpm): 79 Discharge Destination: Home Respiratory Rate (breaths/min): 18 Transportation: Private Auto Blood Pressure (mmHg): 170/109 Accompanied By: self Schedule Follow-up Appointment: Yes Clinical Summary of Care: Notes Patient instructed to follow-up  with PCP regarding his blood pressure. Electronic Signature(s) Signed: 07/11/2020 5:32:24 PM By: [name], [name], RN, [name], [name] RN, [name] Entered By: [name], [name], RN, [name], [name] on 07/11/2020 08:49:14 [name] ([id number]) -------------------------------------------------------------------------------- Lower Extremity Assessment Details Patient Name: [name], Gary A. Date of Service: 07/11/2020 8:15 AM Medical Record Number: [id number] Patient Account Number: [id number] Date of Birth/Sex: [birth year] (75 y.o. M) Treating RN: [name] Primary Care Aevah Stansbery: [name] Other Clinician: [name] Referring Lasheka Kempner: [name] Treating Altair Stanko/Extender: Skipper Cliche in Treatment: 3 Edema Assessment Assessed: [Left: Yes] [Right: No] [Left: Edema] [Right: :] Calf Left: Right: Point of Measurement: 31 cm From Medial Instep 34.8 cm Ankle Left: Right: Point of Measurement: 10 cm From Medial Instep 22.4 cm Vascular Assessment Pulses: Dorsalis Pedis Palpable: [Left:Yes] Electronic Signature(s) Signed: 07/11/2020 3:31:48 PM By: [name] Signed: 07/11/2020 5:32:24 PM By: [name], [name], RN, [name], [name] RN, [name] Entered By: [name] on 07/11/2020 08:35:57 [name] ([id number]) -------------------------------------------------------------------------------- Multi Wound Chart Details Patient Name: [name], Gary A. Date of Service: 07/11/2020 8:15 AM Medical Record Number: [id number] Patient Account Number: [id number] Date of Birth/Sex: 04-27-1946 (75 y.o. M) Treating RN: [name] Primary Care Colinda Barth: [name] Other Clinician: [name] Referring Diala Waxman: [name] Treating Orrie Schubert/Extender: Skipper Cliche in Treatment: 3 Vital Signs Height(in): 75 Pulse(bpm): 79 Weight(lbs): 54 Blood Pressure(mmHg): 170/109 Body Mass Index(BMI): 30 Temperature(F): 97.8 Respiratory Rate(breaths/min): 18 Photos: [N/A:N/A] Wound Location: Left, Posterior  Lower Leg N/A N/A Wounding Event: Skin Tear/Laceration N/A N/A Primary Etiology: Trauma, Other N/A N/A Comorbid History: Cataracts, Coronary Artery Disease, N/A N/A Hypertension, Gout, Received Chemotherapy Date Acquired: 06/07/2020 N/A N/A Weeks of Treatment: 3 N/A N/A Wound Status: Open N/A N/A Measurements L x W x D (cm) 1.5x1.3x0.1 N/A N/A Area (cm) : 1.532 N/A N/A Volume (cm) : 0.153 N/A N/A % Reduction in Area: 42.60% N/A N/A % Reduction in Volume: 42.70% N/A N/A Classification: Full Thickness Without Exposed N/A N/A Support Structures Exudate Amount: Medium N/A N/A Exudate Type: Serosanguineous N/A N/A Exudate Color: red, brown  N/A N/A Granulation Amount: Medium (34-66%) N/A N/A Granulation Quality: Red, Hyper-granulation N/A N/A Necrotic Amount: Small (1-33%) N/A N/A Exposed Structures: Fat Layer (Subcutaneous Tissue): N/A N/A Yes Fascia: No Tendon: No Muscle: No Joint: No Bone: No Epithelialization: None N/A N/A Debridement: Debridement - Excisional N/A N/A Pre-procedure Verification/Time 08:42 N/A N/A Out Taken: Tissue Debrided: Subcutaneous, Slough N/A N/A Level: Skin/Subcutaneous Tissue N/A N/A Debridement Area (sq cm): 1.95 N/A N/A Instrument: Curette N/A N/A Bleeding: Moderate N/A N/A Hemostasis Achieved: Pressure N/A N/A Debridement Treatment Procedure was tolerated well N/A N/A Response: Post Debridement 1.5x1.3x0.2 N/A N/A Measurements L x W x D (cm) CULVER, FEIGHNER ([id number]) Post Debridement Volume: 0.306 N/A N/A (cm) Procedures Performed: Debridement N/A N/A Treatment Notes Wound #1 (Lower Leg) Wound Laterality: Left, Posterior Cleanser Peri-Wound Care Desitin Maximum Strength Ointment 4 (oz) Topical Primary Dressing IODOFLEX 0.9% Cadexomer Iodine Pad Quantity: 1 Discharge Instruction: Apply Iodoflex to wound bed only as directed. Secondary Dressing ABD Pad 5x9 (in/in) Quantity: 1 Discharge Instruction: Cover with ABD pad Secured  With Compression Wrap Profore Lite LF 3 Multilayer Compression Bandaging System Quantity: 1 Discharge Instruction: Apply 3 multi-layer wrap as prescribed. Compression Stockings Environmental education officer) Signed: 07/11/2020 5:32:24 PM By: [name], [name], RN, [name], [name] RN, [name] Entered By: [name], [name], RN, [name], [name] on 07/11/2020 08:48:13 [name] ([id number]) -------------------------------------------------------------------------------- [place] Details Patient Name: [name], BROOKS. Date of Service: 07/11/2020 8:15 AM Medical Record Number: [id number] Patient Account Number: [id number] Date of Birth/Sex: 1946/02/02 (75 y.o. M) Treating RN: [name] Primary Care Arcadia Gorgas: [name] Other Clinician: [name] Referring Meighan Treto: [name] Treating Ankita Newcomer/Extender: Skipper Cliche in Treatment: 3 Active Inactive Necrotic Tissue Nursing Diagnoses: Impaired tissue integrity related to necrotic/devitalized tissue Knowledge deficit related to management of necrotic/devitalized tissue Goals: Necrotic/devitalized tissue will be minimized in the wound bed Date Initiated: 07/04/2020 Target Resolution Date: 07/11/2020 Goal Status: Active Patient/caregiver will verbalize understanding of reason and process for debridement of necrotic tissue Date Initiated: 07/04/2020 Target Resolution Date: 07/11/2020 Goal Status: Active Interventions: Assess patient pain level pre-, during and post procedure and prior to discharge Provide education on necrotic tissue and debridement process Treatment Activities: Excisional debridement : 07/04/2020 Notes: Wound/Skin Impairment Nursing Diagnoses: Impaired tissue integrity Goals: Patient/caregiver will verbalize understanding of skin care regimen Date Initiated: 06/20/2020 Target Resolution Date: 06/20/2020 Goal Status: Active Ulcer/skin breakdown will have a volume reduction of 30% by week 4 Date Initiated:  06/20/2020 Target Resolution Date: 07/18/2020 Goal Status: Active Ulcer/skin breakdown will have a volume reduction of 50% by week 8 Date Initiated: 06/20/2020 Target Resolution Date: 08/18/2020 Goal Status: Active Ulcer/skin breakdown will have a volume reduction of 80% by week 12 Date Initiated: 06/20/2020 Target Resolution Date: 09/17/2020 Goal Status: Active Interventions: Assess patient/caregiver ability to obtain necessary supplies Assess patient/caregiver ability to perform ulcer/skin care regimen upon admission and as needed Assess ulceration(s) every visit Provide education on ulcer and skin care Treatment Activities: Skin care regimen initiated : 06/20/2020 Topical wound management initiated : 06/20/2020 Notes: GRACIANO, BATSON ([id number]) Electronic Signature(s) Signed: 07/11/2020 5:32:24 PM By: [name], [name], RN, [name], [name] RN, [name] Entered By: [name], [name], RN, [name], [name] on 07/11/2020 08:48:05 [name] ([id number]) -------------------------------------------------------------------------------- Pain Assessment Details Patient Name: Gary Merlin A. Date of Service: 07/11/2020 8:15 AM Medical Record Number: [id number] Patient Account Number: [id number] Date of Birth/Sex: 01-17-1946 (75 y.o. M) Treating RN: [name] Primary Care Navon Kotowski: [name] Other Clinician: [name] Referring Carlisle Torgeson: [name] Treating  Ceylin Dreibelbis/Extender: Jeri Cos Weeks in Treatment: 3 Active Problems Location of Pain Severity and Description of Pain Patient Has Paino Yes Site Locations Rate the pain. Current Pain Level: 3 Character of Pain Describe the Pain: Aching Pain Management and Medication Current Pain Management: Electronic Signature(s) Signed: 07/11/2020 3:31:48 PM By: [name] Signed: 07/11/2020 5:32:24 PM By: [name], [name], RN, [name], [name] RN, [name] Entered By: [name] on 07/11/2020 08:21:44 [name]  ([id number]) -------------------------------------------------------------------------------- Patient/Caregiver Education Details Patient Name: [name], Gary A. Date of Service: 07/11/2020 8:15 AM Medical Record Number: [id number] Patient Account Number: [id number] Date of Birth/Gender: January 25, 1946 (75 y.o. M) Treating RN: [name] Primary Care Physician: [name] Other Clinician: [name] Referring Physician: [name] Treating Physician/Extender: Skipper Cliche in Treatment: 3 Education Assessment Education Provided To: Patient Education Topics Provided Wound Debridement: Handouts: Wound Debridement Methods: Demonstration, Explain/Verbal Responses: State content correctly Wound/Skin Impairment: Handouts: Caring for Your Ulcer Methods: Explain/Verbal Responses: State content correctly Electronic Signature(s) Signed: 07/11/2020 5:32:24 PM By: [name], [name], RN, [name], [name] RN, [name] Entered By: [name], [name], RN, [name], [name] on 07/11/2020 08:48:56 [name] ([id number]) -------------------------------------------------------------------------------- Wound Assessment Details Patient Name: [name], Gary A. Date of Service: 07/11/2020 8:15 AM Medical Record Number: [id number] Patient Account Number: [id number] Date of Birth/Sex: [birth year] (75 y.o. M) Treating RN: [name] Primary Care Fusae Florio: [name] Other Clinician: [name] Referring Carmeline Kowal: [name] Treating Jasa Dundon/Extender: Skipper Cliche in Treatment: 3 Wound Status Wound Number: 1 Primary Trauma, Other Etiology: Wound Location: Left, Posterior Lower Leg Wound Status: Open Wounding Event: Skin Tear/Laceration Comorbid Cataracts, Coronary Artery Disease, Hypertension, Date Acquired: 06/07/2020 History: Gout, Received Chemotherapy Weeks Of Treatment: 3 Clustered Wound: No Photos Wound Measurements Length: (cm) 1.5 Width: (cm) 1.3 Depth: (cm) 0.1 Area: (cm) 1.532 Volume: (cm)  0.153 % Reduction in Area: 42.6% % Reduction in Volume: 42.7% Epithelialization: None Tunneling: No Undermining: No Wound Description Classification: Full Thickness Without Exposed Support Structu Exudate Amount: Medium Exudate Type: Serosanguineous Exudate Color: red, brown res Foul Odor After Cleansing: No Slough/Fibrino Yes Wound Bed Granulation Amount: Medium (34-66%) Exposed Structure Granulation Quality: Red, Hyper-granulation Fascia Exposed: No Necrotic Amount: Small (1-33%) Fat Layer (Subcutaneous Tissue) Exposed: Yes Necrotic Quality: Adherent Slough Tendon Exposed: No Muscle Exposed: No Joint Exposed: No Bone Exposed: No Treatment Notes Wound #1 (Lower Leg) Wound Laterality: Left, Posterior Cleanser Peri-Wound Care Desitin Maximum Strength Ointment 4 (oz) Topical [name], Gary A. ([id number]) Primary Dressing IODOFLEX 0.9% Cadexomer Iodine Pad Quantity: 1 Discharge Instruction: Apply Iodoflex to wound bed only as directed. Secondary Dressing ABD Pad 5x9 (in/in) Quantity: 1 Discharge Instruction: Cover with ABD pad Secured With Compression Wrap Profore Lite LF 3 Multilayer Compression Bandaging System Quantity: 1 Discharge Instruction: Apply 3 multi-layer wrap as prescribed. Compression Stockings Add-Ons Electronic Signature(s) Signed: 07/11/2020 3:31:48 PM By: [name] Signed: 07/11/2020 5:32:24 PM By: [name], [name], RN, [name], [name] RN, [name] Entered By: [name] on 07/11/2020 08:43:18 [name] ([id number]) -------------------------------------------------------------------------------- Vitals Details Patient Name: [name], Gary A. Date of Service: 07/11/2020 8:15 AM Medical Record Number: [id number] Patient Account Number: [id number] Date of Birth/Sex: 03-03-1946 (75 y.o. M) Treating RN: [name] Primary Care Ayvah Caroll: [name] Other Clinician: [name] Referring Pieper Kasik: [name] Treating Zyad Boomer/Extender: Skipper Cliche in Treatment: 3 Vital Signs Time Taken: 08:20 Temperature (F): 97.8 Height (in): 75 Pulse (bpm): 79 Weight (lbs): 239 Respiratory Rate (breaths/min): 18 Body Mass Index (BMI): 29.9 Blood Pressure (mmHg): 170/109 Reference Range: 80 - 120 mg /  dl Electronic Signature(s) Signed: 07/11/2020 3:31:48 PM By: [name] Entered By: [name] on 07/11/2020 08:21:20

## 2020-07-18 ENCOUNTER — Encounter: Payer: Medicare HMO | Admitting: Internal Medicine

## 2020-07-18 ENCOUNTER — Other Ambulatory Visit: Payer: Self-pay

## 2020-07-18 DIAGNOSIS — L03116 Cellulitis of left lower limb: Secondary | ICD-10-CM | POA: Diagnosis not present

## 2020-07-18 DIAGNOSIS — Z950 Presence of cardiac pacemaker: Secondary | ICD-10-CM | POA: Diagnosis not present

## 2020-07-18 DIAGNOSIS — E11622 Type 2 diabetes mellitus with other skin ulcer: Secondary | ICD-10-CM | POA: Diagnosis not present

## 2020-07-18 DIAGNOSIS — L97222 Non-pressure chronic ulcer of left calf with fat layer exposed: Secondary | ICD-10-CM | POA: Diagnosis not present

## 2020-07-18 DIAGNOSIS — S80812A Abrasion, left lower leg, initial encounter: Secondary | ICD-10-CM | POA: Diagnosis not present

## 2020-07-18 DIAGNOSIS — L97228 Non-pressure chronic ulcer of left calf with other specified severity: Secondary | ICD-10-CM | POA: Diagnosis not present

## 2020-07-19 NOTE — Progress Notes (Signed)
ANSAR, SKODA ([id number]) Visit Report for 07/18/2020 Arrival Information Details Patient Name: [name], [name]. Date of Service: 07/18/2020 3:30 PM Medical Record Number: [id number] Patient Account Number: [id number] Date of Birth/Sex: 1945-07-10 (75 y.o. M) Treating RN: [name] Primary Care [name]: [name] Other Clinician: [name] Referring [name]: [name] Treating [name]/Extender: [name]: 4 Visit Information History Since Last Visit Pain Present Now: No Patient Arrived: Ambulatory Arrival Time: 15:53 Accompanied By: self Transfer Assistance: None Patient Identification Verified: Yes Secondary Verification Process Completed: Yes Electronic Signature(s) Signed: 07/18/2020 5:04:06 PM By: [name], [name] RN Entered By: [name], [name] on 07/18/2020 15:54:02 [name] ([id number]) -------------------------------------------------------------------------------- Compression Therapy Details Patient Name: Gary Merlin A. Date of Service: 07/18/2020 3:30 PM Medical Record Number: [id number] Patient Account Number: [id number] Date of Birth/Sex: Jun 02, 1945 (75 y.o. M) Treating RN: [name] Primary Care Deanie Jupiter: [name] Other Clinician: [name] Referring Korbyn Vanes: [name] Treating Karol Liendo/Extender: [name]: 4 Compression Therapy Performed for Wound Assessment: Wound #1 Left,Posterior Lower Leg Performed By: Clinician [name], RN Compression Type: Three Layer Pre Treatment ABI: 1.3 Post Procedure Diagnosis Same as Pre-procedure Electronic Signature(s) Signed: 07/18/2020 6:32:03 PM By: [name], [name], RN, [name], [name] RN, [name] Entered By: [name], [name], RN, [name], [name] on 07/18/2020 16:35:17 [name] ([id number]) -------------------------------------------------------------------------------- Encounter Discharge Information Details Patient Name: [name], Gary  A. Date of Service: 07/18/2020 3:30 PM Medical Record Number: [id number] Patient Account Number: [id number] Date of Birth/Sex: 07/28/1945 (75 y.o. M) Treating RN: [name] Primary Care Cheyenne Schumm: [name] Other Clinician: [name] Referring [name]: [name] Treating [name]/Extender: [name]: 4 Encounter Discharge Information Items Discharge Condition: Stable Ambulatory Status: Ambulatory Discharge Destination: Home Transportation: Private Auto Accompanied By: self Schedule Follow-up Appointment: Yes Clinical Summary of Care: Electronic Signature(s) Signed: 07/18/2020 4:49:52 PM By: [name], [name], RN, [name], [name] RN, [name] Entered By: [name], [name], RN, [name], [name] on 07/18/2020 16:49:51 [name] ([id number]) -------------------------------------------------------------------------------- Lower Extremity Assessment Details Patient Name: [name], Gary A. Date of Service: 07/18/2020 3:30 PM Medical Record Number: [id number] Patient Account Number: [id number] Date of Birth/Sex: March 12, 1946 (75 y.o. M) Treating RN: [name] Primary Care Kaitlin Ardito: [name] Other Clinician: [name] Referring [name]: [name] Treating [name]/Extender: [name] Weeks in Treatment: 4 Edema Assessment Assessed: [Left: Yes] [Right: No] Edema: [Left: N] [Right: o] Calf Left: Right: Point of Measurement: 31 cm From Medial Instep 34 cm Ankle Left: Right: Point of Measurement: 10 cm From Medial Instep 23 cm Vascular Assessment Pulses: Dorsalis Pedis Palpable: [Left:Yes] Electronic Signature(s) Signed: 07/18/2020 5:04:06 PM By: [name], [name] RN Entered By: [name], [name] on 07/18/2020 16:02:01 [name] ([id number]) -------------------------------------------------------------------------------- Multi Wound Chart Details Patient Name: Gary Merlin A. Date of Service: 07/18/2020 3:30 PM Medical Record  Number: [id number] Patient Account Number: [id number] Date of Birth/Sex: 10/24/1945 (75 y.o. M) Treating RN: [name] Primary Care Markavious Micco: [name] Other Clinician: [name] Referring [name]: [name] Treating [name]/Extender: [name]: 4 Vital Signs Height(in): 75 Pulse(bpm): 53 Weight(lbs): 239 Blood Pressure(mmHg): 137/77 Body Mass Index(BMI): 30 Temperature(F): 98.3 Respiratory Rate(breaths/min): 18 Photos: [N/A:N/A] Wound Location: Left, Posterior Lower Leg N/A N/A Wounding Event: Skin Tear/Laceration N/A N/A Primary Etiology: Trauma, Other N/A N/A Comorbid History: Cataracts, Coronary Artery Disease, N/A N/A Hypertension, Gout, Received Chemotherapy Date Acquired: 06/07/2020 N/A N/A Weeks of Treatment: 4 N/A N/A Wound Status: Open N/A N/A Measurements L  x W x D (cm) 1.5x1.3x0.2 N/A N/A Area (cm) : 1.532 N/A N/A Volume (cm) : 0.306 N/A N/A % Reduction in Area: 42.60% N/A N/A % Reduction in Volume: -14.60% N/A N/A Classification: Full Thickness Without Exposed N/A N/A Support Structures Exudate Amount: Medium N/A N/A Exudate Type: Serosanguineous N/A N/A Exudate Color: red, brown N/A N/A Granulation Amount: Large (67-100%) N/A N/A Granulation Quality: Red, Hyper-granulation N/A N/A Necrotic Amount: None Present (0%) N/A N/A Exposed Structures: Fat Layer (Subcutaneous Tissue): N/A N/A Yes Fascia: No Tendon: No Muscle: No Joint: No Bone: No Epithelialization: Medium (34-66%) N/A N/A Procedures Performed: Compression Therapy N/A N/A Treatment Notes Wound #1 (Lower Leg) Wound Laterality: Left, Posterior Cleanser Peri-Wound Care Desitin Maximum Strength Ointment 4 (oz) Topical Bishop, Gary A. ([id number]) Primary Dressing Secondary Dressing ABD Pad 5x9 (in/in) Discharge Instruction: Cover with ABD pad Hydrofera Blue Ready Transfer Foam, 2.5x2.5 (in/in) Discharge Instruction: Apply to wound bed over  non-stick dressing. Secured With Compression Wrap Profore Lite LF 3 Multilayer Compression Bandaging System Discharge Instruction: Apply 3 multi-layer wrap as prescribed. Compression Stockings Add-Ons Electronic Signature(s) Signed: 07/19/2020 2:54:56 PM By: Linton Ham MD Entered By: Linton Ham on 07/18/2020 17:34:51 [name] ([id number]) -------------------------------------------------------------------------------- [place] Details Patient Name: [name], Gary A. Date of Service: 07/18/2020 3:30 PM Medical Record Number: [id number] Patient Account Number: [id number] Date of Birth/Sex: 06-Apr-1946 (75 y.o. M) Treating RN: [name] Primary Care Bryen Hinderman: [name] Other Clinician: [name] Referring Viola Kinnick: [name] Treating Halcyon Heck/Extender: [name]: 4 Active Inactive Necrotic Tissue Nursing Diagnoses: Impaired tissue integrity related to necrotic/devitalized tissue Knowledge deficit related to management of necrotic/devitalized tissue Goals: Necrotic/devitalized tissue will be minimized in the wound bed Date Initiated: 07/04/2020 Target Resolution Date: 07/11/2020 Goal Status: Active Patient/caregiver will verbalize understanding of reason and process for debridement of necrotic tissue Date Initiated: 07/04/2020 Target Resolution Date: 07/11/2020 Goal Status: Active Interventions: Assess patient pain level pre-, during and post procedure and prior to discharge Provide education on necrotic tissue and debridement process Treatment Activities: Excisional debridement : 07/04/2020 Notes: Wound/Skin Impairment Nursing Diagnoses: Impaired tissue integrity Goals: Patient/caregiver will verbalize understanding of skin care regimen Date Initiated: 06/20/2020 Target Resolution Date: 06/20/2020 Goal Status: Active Ulcer/skin breakdown will have a volume reduction of 30% by week 4 Date Initiated:  06/20/2020 Target Resolution Date: 07/18/2020 Goal Status: Active Ulcer/skin breakdown will have a volume reduction of 50% by week 8 Date Initiated: 06/20/2020 Target Resolution Date: 08/18/2020 Goal Status: Active Ulcer/skin breakdown will have a volume reduction of 80% by week 12 Date Initiated: 06/20/2020 Target Resolution Date: 09/17/2020 Goal Status: Active Interventions: Assess patient/caregiver ability to obtain necessary supplies Assess patient/caregiver ability to perform ulcer/skin care regimen upon admission and as needed Assess ulceration(s) every visit Provide education on ulcer and skin care Treatment Activities: Skin care regimen initiated : 06/20/2020 Topical wound management initiated : 06/20/2020 Notes: [name], [name] ([id number]) Electronic Signature(s) Signed: 07/18/2020 6:32:03 PM By: [name], [name], RN, [name], [name] RN, [name] Entered By: [name], [name], RN, [name], [name] on 07/18/2020 16:34:51 [name] ([id number]) -------------------------------------------------------------------------------- Pain Assessment Details Patient Name: Gary Merlin A. Date of Service: 07/18/2020 3:30 PM Medical Record Number: [id number] Patient Account Number: [id number] Date of Birth/Sex: [birth year] (75 y.o. M) Treating RN: [name] Primary Care Landis Dowdy: [name] Other Clinician: [name] Referring Camilia Caywood: [name] Treating Anisha Starliper/Extender: [name]: 4 Active Problems Location of Pain Severity and Description of Pain Patient Has Paino No Site  Locations Rate the pain. Current Pain Level: 0 Pain Management and Medication Current Pain Management: Electronic Signature(s) Signed: 07/18/2020 5:04:06 PM By: [name], [name] RN Entered By: [name], [name] on 07/18/2020 15:55:04 [name] ([id number]) -------------------------------------------------------------------------------- Patient/Caregiver Education Details Patient  Name: Gary Merlin A. Date of Service: 07/18/2020 3:30 PM Medical Record Number: [id number] Patient Account Number: [id number] Date of Birth/Gender: Oct 31, 1945 (75 y.o. M) Treating RN: [name] Primary Care Physician: [name] Other Clinician: [name] Referring Physician: [name] Treating Physician/Extender: [name]: 4 Education Assessment Education Provided To: Patient Education Topics Provided Venous: Handouts: Controlling Swelling with Multilayered Compression Wraps Methods: Demonstration, Explain/Verbal Responses: State content correctly Wound/Skin Impairment: Handouts: Other: wound care as prescribed Methods: Explain/Verbal Responses: State content correctly Electronic Signature(s) Signed: 07/18/2020 6:32:03 PM By: [name], [name], RN, [name], [name] RN, [name] Entered By: [name], [name], RN, [name], [name] on 07/18/2020 16:49:21 [name] ([id number]) -------------------------------------------------------------------------------- Wound Assessment Details Patient Name: Gary Merlin A. Date of Service: 07/18/2020 3:30 PM Medical Record Number: [id number] Patient Account Number: [id number] Date of Birth/Sex: 1946-01-26 (75 y.o. M) Treating RN: [name] Primary Care Carlisa Eble: [name] Other Clinician: [name] Referring Dinora Hemm: [name] Treating Leara Rawl/Extender: [name]: 4 Wound Status Wound Number: 1 Primary Trauma, Other Etiology: Wound Location: Left, Posterior Lower Leg Wound Status: Open Wounding Event: Skin Tear/Laceration Comorbid Cataracts, Coronary Artery Disease, Hypertension, Date Acquired: 06/07/2020 History: Gout, Received Chemotherapy Weeks Of Treatment: 4 Clustered Wound: No Photos Wound Measurements Length: (cm) 1.5 Width: (cm) 1.3 Depth: (cm) 0.2 Area: (cm) 1.532 Volume: (cm) 0.306 % Reduction in Area: 42.6% % Reduction in Volume: -14.6% Epithelialization:  Medium (34-66%) Tunneling: No Undermining: No Wound Description Classification: Full Thickness Without Exposed Support Structu Exudate Amount: Medium Exudate Type: Serosanguineous Exudate Color: red, brown res Foul Odor After Cleansing: No Slough/Fibrino No Wound Bed Granulation Amount: Large (67-100%) Exposed Structure Granulation Quality: Red, Hyper-granulation Fascia Exposed: No Necrotic Amount: None Present (0%) Fat Layer (Subcutaneous Tissue) Exposed: Yes Tendon Exposed: No Muscle Exposed: No Joint Exposed: No Bone Exposed: No Treatment Notes Wound #1 (Lower Leg) Wound Laterality: Left, Posterior Cleanser Peri-Wound Care Desitin Maximum Strength Ointment 4 (oz) Topical [name], Gary A. ([id number]) Primary Dressing Secondary Dressing ABD Pad 5x9 (in/in) Discharge Instruction: Cover with ABD pad Hydrofera Blue Ready Transfer Foam, 2.5x2.5 (in/in) Discharge Instruction: Apply to wound bed over non-stick dressing. Secured With Compression Wrap Profore Lite LF 3 Multilayer Compression Bandaging System Discharge Instruction: Apply 3 multi-layer wrap as prescribed. Compression Stockings Add-Ons Electronic Signature(s) Signed: 07/18/2020 5:04:06 PM By: [name], [name] RN Entered By: [name], [name] on 07/18/2020 16:00:55 [name] ([id number]) -------------------------------------------------------------------------------- Vitals Details Patient Name: Gary Merlin A. Date of Service: 07/18/2020 3:30 PM Medical Record Number: [id number] Patient Account Number: [id number] Date of Birth/Sex: Aug 07, 1945 (75 y.o. M) Treating RN: [name] Primary Care Robinn Overholt: [name] Other Clinician: [name] Referring Mera Gunkel: [name] Treating Devron Cohick/Extender: [name]: 4 Vital Signs Time Taken: 15:54 Temperature (F): 98.3 Height (in): 75 Pulse (bpm): 67 Weight (lbs): 239 Respiratory Rate (breaths/min):  18 Body Mass Index (BMI): 29.9 Blood Pressure (mmHg): 137/77 Reference Range: 80 - 120 mg / dl Electronic Signature(s) Signed: 07/18/2020 5:04:06 PM By: [name], [name] RN Entered By: [name], [name] on 07/18/2020 15:54:58

## 2020-07-19 NOTE — Progress Notes (Signed)
ADETOKUNBO, MCCADDEN ([id number]) Visit Report for 07/18/2020 HPI Details Patient Name: [name], [name]. Date of Service: 07/18/2020 3:30 PM Medical Record Number: [id number] Patient Account Number: [id number] Date of Birth/Sex: 02-23-1946 (75 y.o. 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 He is on his feet for long hours. He tells [place] that in early February he scratched himself with his fingernails on his posterior calf unbeknownst to him causing a wound. He was admitted to hospital from 06/11/2020 through 06/13/2020 with left leg cellulitis. Blood cultures were negative and ultrasound was negative for DVT and no abscess was found. He has completed antibiotics. He is using a Neosporin and a Band-Aid over this area. Past medical history includes type 2 diabetes, sick sinus syndrome, hypertension, coronary artery disease, pacemaker. He is not on oral anticoagulants ABI in our clinic was 1.26 on the left 2/23; patient who presented to clinic last week having a minor trauma to his leg however he required admitting to the hospital for treatment of left leg cellulitis. Small wound on the left posterior calf however completely necrotic last week requiring an aggressive debridement. We put him in 3 layer compression with Iodoflex. He tolerated this well 3/2 patient continues to have a small wound on the left posterior calf however still with a mostly necrotic surface. Still requires debridement we have been using Iodoflex under compression. Original wound was minor trauma complicated by cellulitis earlier this month. 07/11/2020 upon evaluation today patient appears to be doing well with regard to his wound on  the posterior leg. We have been using Iodoflex which I do feel like it is helpful for him. Is helping to clean up the wound bed. With that being said there is little bit of irritation around the edges of the wound I think the possible using a little Desitin here will help protect that region. Overall I think the cellulitis has improved and that is doing quite well. My main issue that I see is just that of slough buildup in the middle portion of the wound. 3/16; left posterior lower leg. Much better wound surface with a rim of surrounding epithelialization. We have been using Iodoflex but I felt we could change him to [facility] today Electronic Signature(s) Signed: 07/19/2020 2:54:56 PM By: Linton Ham MD Entered By: Linton Ham on 07/18/2020 17:35:20 [name] ([id number]) -------------------------------------------------------------------------------- Physical Exam Details Patient Name: [name], [name] Date of Service: 07/18/2020 3:30 PM Medical Record Number: [id number] Patient Account Number: [id number] Date of Birth/Sex: 1946-04-17 (75 y.o. M) Treating RN: [name] Primary Care Provider: [name] Other Clinician: [name] Referring Provider: [name] Treating Provider/Extender: [name]: 4 Constitutional Sitting or standing Blood Pressure is within target range for patient.. Pulse regular and within target range for patient.Marland Kitchen Respirations regular, non- labored and within target range.Marland Kitchen appears in no distress. Notes Wound exam; left posterior calf. Under illumination the wound bed is clear. No debridement is required. He has rims of epithelialization. No evidence of surrounding infection Electronic Signature(s) Signed: 07/19/2020 2:54:56 PM By: Linton Ham MD Entered By: Linton Ham on 07/18/2020 17:36:04 [name]  ([id number]) -------------------------------------------------------------------------------- Physician Orders Details Patient Name: Gary Merlin A. Date of Service: 07/18/2020 3:30 PM Medical Record  Number: [id number] Patient Account Number: [id number] Date of Birth/Sex: Mar 13, 1946 (75 y.o. M) Treating RN: [name] Primary Care Provider: [name] Other Clinician: [name] Referring Provider: [name] Treating Provider/Extender: [name]: 4 Verbal / Phone Orders: No Diagnosis Coding Follow-up Appointments o Return Appointment in 1 week. o Nurse Visit as needed [employer] o Wash wounds with antibacterial soap and water. o May shower with wound dressing protected with water repellent cover or cast protector. Anesthetic (Use 'Patient Medications' Section for Anesthetic Order Entry) o Lidocaine applied to wound bed Edema Control - Lymphedema / Segmental Compressive Device / Other Left Lower Extremity o Optional: One layer of unna paste to top of compression wrap (to act as an anchor). o 3 Layer Compression System for Lymphedema. o Elevate, Exercise Daily and Avoid Standing for Long Periods of Time. o Elevate legs to the level of the heart and pump ankles as often as possible o Elevate leg(s) parallel to the floor when sitting. Wound Treatment Wound #1 - Lower Leg Wound Laterality: Left, Posterior Peri-Wound Care: Desitin Maximum Strength Ointment 4 (oz) Secondary Dressing: ABD Pad 5x9 (in/in) Discharge Instructions: Cover with ABD pad Secondary Dressing: Hydrofera Blue Ready Transfer Foam, 2.5x2.5 (in/in) Discharge Instructions: Apply to wound bed over non-stick dressing. Compression Wrap: Profore Lite LF 3 Multilayer Compression Bandaging System Discharge Instructions: Apply 3 multi-layer wrap as prescribed. Electronic Signature(s) Signed: 07/18/2020 6:32:03 PM By: [name], [name], RN, [name], [name] RN,  [name] Signed: 07/19/2020 2:54:56 PM By: Linton Ham MD Entered By: [name], [name], RN, [name], [name] on 07/18/2020 16:36:42 ANTHONIO, MIZZELL ([id number]) -------------------------------------------------------------------------------- Problem List Details Patient Name: [name]. Date of Service: 07/18/2020 3:30 PM Medical Record Number: [id number] Patient Account Number: [id number] Date of Birth/Sex: May 16, 1945 (75 y.o. M) Treating RN: [name] Primary Care Provider: [name] Other Clinician: [name] Referring Provider: [name] Treating Provider/Extender: [name]: 4 Active Problems ICD-10 Encounter Code Description Active Date MDM Diagnosis S80.812D Abrasion, left lower leg, subsequent encounter 06/20/2020 No Yes L97.228 Non-pressure chronic ulcer of left calf with other specified severity 06/20/2020 No Yes I87.312 Chronic venous hypertension (idiopathic) with ulcer of left lower 06/20/2020 No Yes extremity Inactive Problems Resolved Problems Electronic Signature(s) Signed: 07/19/2020 2:54:56 PM By: Linton Ham MD Entered By: Linton Ham on 07/18/2020 17:34:45 [name] ([id number]) -------------------------------------------------------------------------------- Progress Note Details Patient Name: Gary Merlin A. Date of Service: 07/18/2020 3:30 PM Medical Record Number: [id number] Patient Account Number: [id number] Date of Birth/Sex: November 01, 1945 (75 y.o. 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 He is on his feet for long hours. He tells [place] that in early February he scratched himself with his fingernails on his  posterior calf unbeknownst to him causing a wound. He was admitted to hospital from 06/11/2020 through 06/13/2020 with left leg cellulitis. Blood cultures were negative and ultrasound was negative for DVT and no abscess was found. He has completed antibiotics. He is using a Neosporin and a Band-Aid over this area. Past medical history includes type 2 diabetes, sick sinus syndrome, hypertension, coronary artery disease, pacemaker. He is not on oral anticoagulants ABI in our clinic was 1.26 on the left 2/23; patient who presented to clinic last week  having a minor trauma to his leg however he required admitting to the hospital for treatment of left leg cellulitis. Small wound on the left posterior calf however completely necrotic last week requiring an aggressive debridement. We put him in 3 layer compression with Iodoflex. He tolerated this well 3/2 patient continues to have a small wound on the left posterior calf however still with a mostly necrotic surface. Still requires debridement we have been using Iodoflex under compression. Original wound was minor trauma complicated by cellulitis earlier this month. 07/11/2020 upon evaluation today patient appears to be doing well with regard to his wound on the posterior leg. We have been using Iodoflex which I do feel like it is helpful for him. Is helping to clean up the wound bed. With that being said there is little bit of irritation around the edges of the wound I think the possible using a little Desitin here will help protect that region. Overall I think the cellulitis has improved and that is doing quite well. My main issue that I see is just that of slough buildup in the middle portion of the wound. 3/16; left posterior lower leg. Much better wound surface with a rim of surrounding epithelialization. We have been using Iodoflex but I felt we could change him to [facility] today Objective Constitutional Sitting or standing Blood Pressure is within  target range for patient.. Pulse regular and within target range for patient.Marland Kitchen Respirations regular, non- labored and within target range.Marland Kitchen appears in no distress. Vitals Time Taken: 3:54 PM, Height: 75 in, Weight: 239 lbs, BMI: 29.9, Temperature: 98.3 F, Pulse: 67 bpm, Respiratory Rate: 18 breaths/min, Blood Pressure: 137/77 mmHg. General Notes: Wound exam; left posterior calf. Under illumination the wound bed is clear. No debridement is required. He has rims of epithelialization. No evidence of surrounding infection Integumentary (Hair, Skin) Wound #1 status is Open. Original cause of wound was Skin Tear/Laceration. The date acquired was: 06/07/2020. The wound has been in treatment 4 weeks. The wound is located on the Left,Posterior Lower Leg. The wound measures 1.5cm length x 1.3cm width x 0.2cm depth; 1.532cm^2 area and 0.306cm^3 volume. There is Fat Layer (Subcutaneous Tissue) exposed. There is no tunneling or undermining noted. There is a medium amount of serosanguineous drainage noted. There is large (67-100%) red, hyper - granulation within the wound bed. There is no necrotic tissue within the wound bed. Assessment Active Problems ICD-10 HALSEY, HAMMEN ([id number]) Abrasion, left lower leg, subsequent encounter Non-pressure chronic ulcer of left calf with other specified severity Chronic venous hypertension (idiopathic) with ulcer of left lower extremity Procedures Wound #1 Pre-procedure diagnosis of Wound #1 is a Trauma, Other located on the Left,Posterior Lower Leg . There was a Three Layer Compression Therapy Procedure with a pre-treatment ABI of 1.3 by [name], RN. Post procedure Diagnosis Wound #1: Same as Pre-Procedure Plan Follow-up Appointments: Return Appointment in 1 week. Nurse Visit as needed Bathing/ Shower/ Hygiene: Wash wounds with antibacterial soap and water. May shower with wound dressing protected with water repellent cover or cast protector. Anesthetic  (Use 'Patient Medications' Section for Anesthetic Order Entry): Lidocaine applied to wound bed Edema Control - Lymphedema / Segmental Compressive Device / Other: Optional: One layer of unna paste to top of compression wrap (to act as an anchor). 3 Layer Compression System for Lymphedema. Elevate, Exercise Daily and Avoid Standing for Long Periods of Time. Elevate legs to the level of the heart and pump ankles as often as possible Elevate leg(s)  parallel to the floor when sitting. WOUND #1: - Lower Leg Wound Laterality: Left, Posterior Peri-Wound Care: Desitin Maximum Strength Ointment 4 (oz) Secondary Dressing: ABD Pad 5x9 (in/in) Discharge Instructions: Cover with ABD pad Secondary Dressing: Hydrofera Blue Ready Transfer Foam, 2.5x2.5 (in/in) Discharge Instructions: Apply to wound bed over non-stick dressing. Compression Wrap: Profore Lite LF 3 Multilayer Compression Bandaging System Discharge Instructions: Apply 3 multi-layer wrap as prescribed. 1. I change the primary dressing to [facility] still under the same compression which is 3 layer. 2. Hopefully further epithelialization next week I really cannot see anything at this point that would hinder advancing epithelialization Electronic Signature(s) Signed: 07/19/2020 2:54:56 PM By: Linton Ham MD Entered By: Linton Ham on 07/18/2020 17:36:37 [name] ([id number]) -------------------------------------------------------------------------------- Twilight Details Patient Name: Gary Merlin A. Date of Service: 07/18/2020 Medical Record Number: [id number] Patient Account Number: [id number] Date of Birth/Sex: [birth year] (75 y.o. M) Treating RN: [name] Primary Care Provider: [name] Other Clinician: [name] Referring Provider: [name] Treating Provider/Extender: [name]: 4 Diagnosis Coding ICD-10 Codes Code Description [id number] Abrasion, left lower leg, subsequent  encounter L97.228 Non-pressure chronic ulcer of left calf with other specified severity I87.312 Chronic venous hypertension (idiopathic) with ulcer of left lower extremity Facility Procedures CPT4 Code: 63016010 Description: (Facility Use Only) [phone] - [place] LWR LT LEG Modifier: Quantity: 1 Physician Procedures CPT4 Code: 3220254 Description: 27062 - WC PHYS LEVEL 3 - EST PT Modifier: Quantity: 1 CPT4 Code: Description: ICD-10 Diagnosis Description S80.812D Abrasion, left lower leg, subsequent encounter L97.228 Non-pressure chronic ulcer of left calf with other specified severity I87.312 Chronic venous hypertension (idiopathic) with ulcer of left lower extr Modifier: emity Quantity: Electronic Signature(s) Signed: 07/19/2020 2:54:56 PM By: Linton Ham MD Previous Signature: 07/18/2020 4:48:50 PM Version By: [name], [name], RN, [name], [name] RN, [name] Entered By: Linton Ham on 07/18/2020 17:37:01

## 2020-07-25 ENCOUNTER — Encounter: Payer: Medicare HMO | Admitting: Internal Medicine

## 2020-07-25 ENCOUNTER — Other Ambulatory Visit: Payer: Self-pay

## 2020-07-25 DIAGNOSIS — L03116 Cellulitis of left lower limb: Secondary | ICD-10-CM | POA: Diagnosis not present

## 2020-07-25 DIAGNOSIS — L97228 Non-pressure chronic ulcer of left calf with other specified severity: Secondary | ICD-10-CM | POA: Diagnosis not present

## 2020-07-25 DIAGNOSIS — Z950 Presence of cardiac pacemaker: Secondary | ICD-10-CM | POA: Diagnosis not present

## 2020-07-25 DIAGNOSIS — I87312 Chronic venous hypertension (idiopathic) with ulcer of left lower extremity: Secondary | ICD-10-CM | POA: Diagnosis not present

## 2020-07-25 DIAGNOSIS — E11622 Type 2 diabetes mellitus with other skin ulcer: Secondary | ICD-10-CM | POA: Diagnosis not present

## 2020-07-25 DIAGNOSIS — L97222 Non-pressure chronic ulcer of left calf with fat layer exposed: Secondary | ICD-10-CM | POA: Diagnosis not present

## 2020-07-26 NOTE — Progress Notes (Signed)
GUALBERTO, WAHLEN ([id number]) Visit Report for 07/25/2020 Arrival Information Details Patient Name: [name], [name]. Date of Service: 07/25/2020 2:45 PM Medical Record Number: [id number] Patient Account Number: [id number] Date of Birth/Sex: [birth year] (75 y.o. M) Treating RN: [name] Primary Care Mardene Lessig: [name] Other Clinician: [name] Referring Neita Landrigan: [name] Treating Keniel Ralston/Extender: [name]: 5 Visit Information History Since Last Visit Hospitalized since last visit: No Patient Arrived: Ambulatory Has Compression in Place as Prescribed: Yes Arrival Time: 15:11 Pain Present Now: No Accompanied By: self Transfer Assistance: None Patient Identification Verified: Yes Secondary Verification Process Completed: Yes Electronic Signature(s) Signed: 07/25/2020 4:40:52 PM By: [name], [name] RN Entered By: [name], [name] on 07/25/2020 15:11:30 [name] ([id number]) -------------------------------------------------------------------------------- Compression Therapy Details Patient Name: Gary Merlin A. Date of Service: 07/25/2020 2:45 PM Medical Record Number: [id number] Patient Account Number: [id number] Date of Birth/Sex: Jun 24, 1945 (75 y.o. M) Treating RN: [name] Primary Care Aleka Twitty: [name] Other Clinician: [name] Referring Marianita Botkin: [name] Treating Shatana Saxton/Extender: [name]: 5 Compression Therapy Performed for Wound Assessment: Wound #1 Left,Posterior Lower Leg Performed By: Clinician [name], RN Compression Type: Three Layer Pre Treatment ABI: 1.3 Post Procedure Diagnosis Same as Pre-procedure Electronic Signature(s) Signed: 07/25/2020 6:23:16 PM By: [name], [name], RN, [name], [name] RN, [name] Entered By: [name], [name], RN, [name], [name] on 07/25/2020 15:58:34 [name]  ([id number]) -------------------------------------------------------------------------------- Encounter Discharge Information Details Patient Name: [name], Gary A. Date of Service: 07/25/2020 2:45 PM Medical Record Number: [id number] Patient Account Number: [id number] Date of Birth/Sex: [birth year] (75 y.o. M) Treating RN: [name] Primary Care Patrisha Hausmann: [name] Other Clinician: [name] Referring Deloras Reichard: [name] Treating Tamora Huneke/Extender: [name]: 5 Encounter Discharge Information Items Discharge Condition: Stable Ambulatory Status: Ambulatory Discharge Destination: Home Transportation: Private Auto Accompanied By: self Schedule Follow-up Appointment: Yes Clinical Summary of Care: Electronic Signature(s) Signed: 07/25/2020 4:40:52 PM By: [name], [name] RN Entered By: [name], [name] on 07/25/2020 16:15:38 [name] ([id number]) -------------------------------------------------------------------------------- Lower Extremity Assessment Details Patient Name: [name], Gary A. Date of Service: 07/25/2020 2:45 PM Medical Record Number: [id number] Patient Account Number: [id number] Date of Birth/Sex: 12-09-1945 (75 y.o. M) Treating RN: [name] Primary Care Jashayla Glatfelter: [name] Other Clinician: [name] Referring Brilee Port: [name] Treating Cristela Stalder/Extender: [name]: 5 Edema Assessment Assessed: [Left: Yes] [Right: No] Edema: [Left: N] [Right: o] Calf Left: Right: Point of Measurement: 31 cm From Medial Instep 35 cm Ankle Left: Right: Point of Measurement: 10 cm From Medial Instep 22 cm Vascular Assessment Pulses: Dorsalis Pedis Palpable: [Left:Yes] Electronic Signature(s) Signed: 07/25/2020 4:40:52 PM By: [name], [name] RN Entered By: [name], [name] on 07/25/2020 15:26:16 [name]  ([id number]) -------------------------------------------------------------------------------- Multi Wound Chart Details Patient Name: Gary Merlin A. Date of Service: 07/25/2020 2:45 PM Medical Record Number: [id number] Patient Account Number: [id number] Date of Birth/Sex: [birth year] (75 y.o. M) Treating RN: [name] Primary Care Shamiya Demeritt: [name] Other Clinician: [name] Referring Chamara Dyck: [name] Treating Kohlton Gilpatrick/Extender: [name]: 5 Vital Signs Height(in): 75 Pulse(bpm): 7 Weight(lbs): 81 Blood Pressure(mmHg): 155/97 Body Mass Index(BMI): 30 Temperature(F): 98.0 Respiratory Rate(breaths/min): 18 Photos: [N/A:N/A] Wound Location: Left, Posterior Lower Leg N/A N/A Wounding Event: Skin Tear/Laceration N/A N/A Primary Etiology: Trauma, Other N/A N/A Comorbid History: Cataracts, Coronary Artery Disease, N/A N/A Hypertension, Gout, Received Chemotherapy Date Acquired: 06/07/2020 N/A N/A Weeks of Treatment: 5 N/A N/A  Wound Status: Open N/A N/A Measurements L x W x D (cm) 0.9x0.8x0.2 N/A N/A Area (cm) : 0.565 N/A N/A Volume (cm) : 0.113 N/A N/A % Reduction in Area: 78.80% N/A N/A % Reduction in Volume: 57.70% N/A N/A Classification: Full Thickness Without Exposed N/A N/A Support Structures Exudate Amount: Medium N/A N/A Exudate Type: Sanguinous N/A N/A Exudate Color: red N/A N/A Granulation Amount: Large (67-100%) N/A N/A Granulation Quality: Red N/A N/A Necrotic Amount: None Present (0%) N/A N/A Exposed Structures: Fat Layer (Subcutaneous Tissue): N/A N/A Yes Fascia: No Tendon: No Muscle: No Joint: No Bone: No Epithelialization: Medium (34-66%) N/A N/A Procedures Performed: Compression Therapy N/A N/A Treatment Notes Wound #1 (Lower Leg) Wound Laterality: Left, Posterior Cleanser Peri-Wound Care Desitin Maximum Strength Ointment 4 (oz) Topical Debski, Avin A. ([id number]) Primary Dressing Secondary  Dressing ABD Pad 5x9 (in/in) Quantity: 1 Discharge Instruction: Cover with ABD pad Hydrofera Blue Ready Transfer Foam, 2.5x2.5 (in/in) Quantity: 1 Discharge Instruction: Apply to wound bed over non-stick dressing. Secured With Compression Wrap Profore Lite LF 3 Multilayer Compression Bandaging System Quantity: 1 Discharge Instruction: Apply 3 multi-layer wrap as prescribed. Compression Stockings Add-Ons Electronic Signature(s) Signed: 07/25/2020 5:15:48 PM By: Linton Ham MD Entered By: Linton Ham on 07/25/2020 16:45:40 [name] ([id number]) -------------------------------------------------------------------------------- [place] Details Patient Name: [name], Gary A. Date of Service: 07/25/2020 2:45 PM Medical Record Number: [id number] Patient Account Number: [id number] Date of Birth/Sex: Oct 01, 1945 (75 y.o. M) Treating RN: [name] Primary Care Taim Wurm: [name] Other Clinician: [name] Referring Vear Staton: [name] Treating Lula Michaux/Extender: [name]: 5 Active Inactive Necrotic Tissue Nursing Diagnoses: Impaired tissue integrity related to necrotic/devitalized tissue Knowledge deficit related to management of necrotic/devitalized tissue Goals: Necrotic/devitalized tissue will be minimized in the wound bed Date Initiated: 07/04/2020 Target Resolution Date: 07/11/2020 Goal Status: Active Patient/caregiver will verbalize understanding of reason and process for debridement of necrotic tissue Date Initiated: 07/04/2020 Target Resolution Date: 07/11/2020 Goal Status: Active Interventions: Assess patient pain level pre-, during and post procedure and prior to discharge Provide education on necrotic tissue and debridement process Treatment Activities: Excisional debridement : 07/04/2020 Notes: Wound/Skin Impairment Nursing Diagnoses: Impaired tissue integrity Goals: Patient/caregiver will verbalize  understanding of skin care regimen Date Initiated: 06/20/2020 Target Resolution Date: 06/20/2020 Goal Status: Active Ulcer/skin breakdown will have a volume reduction of 30% by week 4 Date Initiated: 06/20/2020 Target Resolution Date: 07/18/2020 Goal Status: Active Ulcer/skin breakdown will have a volume reduction of 50% by week 8 Date Initiated: 06/20/2020 Target Resolution Date: 08/18/2020 Goal Status: Active Ulcer/skin breakdown will have a volume reduction of 80% by week 12 Date Initiated: 06/20/2020 Target Resolution Date: 09/17/2020 Goal Status: Active Interventions: Assess patient/caregiver ability to obtain necessary supplies Assess patient/caregiver ability to perform ulcer/skin care regimen upon admission and as needed Assess ulceration(s) every visit Provide education on ulcer and skin care Treatment Activities: Skin care regimen initiated : 06/20/2020 Topical wound management initiated : 06/20/2020 Notes: [name], [name] ([id number]) Electronic Signature(s) Signed: 07/25/2020 6:23:16 PM By: [name], [name], RN, [name], [name] RN, [name] Entered By: [name], [name], RN, [name], [name] on 07/25/2020 15:57:46 [name] ([id number]) -------------------------------------------------------------------------------- Pain Assessment Details Patient Name: Gary Merlin A. Date of Service: 07/25/2020 2:45 PM Medical Record Number: [id number] Patient Account Number: [id number] Date of Birth/Sex: [birth year] (75 y.o. M) Treating RN: [name] Primary Care Edom Schmuhl: [name] Other Clinician: [name] Referring Jaleyah Longhi: [name] Treating Andriy Sherk/Extender: [name]: 5 Active Problems Location of  Pain Severity and Description of Pain Patient Has Paino No Site Locations Rate the pain. Current Pain Level: 0 Pain Management and Medication Current Pain Management: Electronic Signature(s) Signed: 07/25/2020 4:40:52 PM By: [name], [name] RN Entered By:  [name], Kenia on 07/25/2020 15:13:30 [name] ([id number]) -------------------------------------------------------------------------------- Patient/Caregiver Education Details Patient Name: Gary Merlin A. Date of Service: 07/25/2020 2:45 PM Medical Record Number: [id number] Patient Account Number: [id number] Date of Birth/Gender: 1945-08-09 (75 y.o. M) Treating RN: [name] Primary Care Physician: [name] Other Clinician: [name] Referring Physician: [name] Treating Physician/Extender: [name]: 5 Education Assessment Education Provided To: Patient Education Topics Provided Venous: Handouts: Controlling Swelling with Multilayered Compression Wraps Methods: Demonstration, Explain/Verbal Responses: State content correctly Electronic Signature(s) Signed: 07/25/2020 6:23:16 PM By: [name], [name], RN, [name], [name] RN, [name] Entered By: [name], [name], RN, [name], [name] on 07/25/2020 15:59:30 [name] ([id number]) -------------------------------------------------------------------------------- Wound Assessment Details Patient Name: [name], Gary A. Date of Service: 07/25/2020 2:45 PM Medical Record Number: [id number] Patient Account Number: [id number] Date of Birth/Sex: October 11, 1945 (75 y.o. M) Treating RN: [name] Primary Care Valrie Jia: [name] Other Clinician: [name] Referring Nathania Waldman: [name] Treating Karolyna Bianchini/Extender: [name]: 5 Wound Status Wound Number: 1 Primary Trauma, Other Etiology: Wound Location: Left, Posterior Lower Leg Wound Status: Open Wounding Event: Skin Tear/Laceration Comorbid Cataracts, Coronary Artery Disease, Hypertension, Date Acquired: 06/07/2020 History: Gout, Received Chemotherapy Weeks Of Treatment: 5 Clustered Wound: No Photos Wound Measurements Length: (cm) 0.9 Width: (cm) 0.8 Depth: (cm) 0.2 Area: (cm) 0.565 Volume: (cm) 0.113 %  Reduction in Area: 78.8% % Reduction in Volume: 57.7% Epithelialization: Medium (34-66%) Tunneling: No Undermining: No Wound Description Classification: Full Thickness Without Exposed Support Structu Exudate Amount: Medium Exudate Type: Sanguinous Exudate Color: red res Foul Odor After Cleansing: No Slough/Fibrino No Wound Bed Granulation Amount: Large (67-100%) Exposed Structure Granulation Quality: Red Fascia Exposed: No Necrotic Amount: None Present (0%) Fat Layer (Subcutaneous Tissue) Exposed: Yes Tendon Exposed: No Muscle Exposed: No Joint Exposed: No Bone Exposed: No Treatment Notes Wound #1 (Lower Leg) Wound Laterality: Left, Posterior Cleanser Peri-Wound Care Desitin Maximum Strength Ointment 4 (oz) Topical Bishop, Gary A. ([id number]) Primary Dressing Secondary Dressing ABD Pad 5x9 (in/in) Quantity: 1 Discharge Instruction: Cover with ABD pad Hydrofera Blue Ready Transfer Foam, 2.5x2.5 (in/in) Quantity: 1 Discharge Instruction: Apply to wound bed over non-stick dressing. Secured With Compression Wrap Profore Lite LF 3 Multilayer Compression Bandaging System Quantity: 1 Discharge Instruction: Apply 3 multi-layer wrap as prescribed. Compression Stockings Add-Ons Electronic Signature(s) Signed: 07/25/2020 4:40:52 PM By: [name], [name] RN Entered By: [name], [name] on 07/25/2020 15:21:06 [name] ([id number]) -------------------------------------------------------------------------------- Vitals Details Patient Name: Gary Merlin A. Date of Service: 07/25/2020 2:45 PM Medical Record Number: [id number] Patient Account Number: [id number] Date of Birth/Sex: Nov 10, 1945 (75 y.o. M) Treating RN: [name] Primary Care Kazimierz Springborn: [name] Other Clinician: [name] Referring Makaylen Thieme: [name] Treating Tung Pustejovsky/Extender: [name]: 5 Vital Signs Time Taken: 15:11 Temperature (F):  98.0 Height (in): 75 Pulse (bpm): 79 Weight (lbs): 239 Respiratory Rate (breaths/min): 18 Body Mass Index (BMI): 29.9 Blood Pressure (mmHg): 155/97 Reference Range: 80 - 120 mg / dl Electronic Signature(s) Signed: 07/25/2020 4:40:52 PM By: [name], [name] RN Entered By: [name], [name] on 07/25/2020 15:13:15

## 2020-07-26 NOTE — Progress Notes (Signed)
DRAGAN, TAMBURRINO ([id number]) Visit Report for 07/25/2020 HPI Details Patient Name: [name], [name]. Date of Service: 07/25/2020 2:45 PM Medical Record Number: [id number] Patient Account Number: [id number] Date of Birth/Sex: Apr 07, 1946 (75 y.o. 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 He is on his feet for long hours. He tells [place] that in early February he scratched himself with his fingernails on his posterior calf unbeknownst to him causing a wound. He was admitted to hospital from 06/11/2020 through 06/13/2020 with left leg cellulitis. Blood cultures were negative and ultrasound was negative for DVT and no abscess was found. He has completed antibiotics. He is using a Neosporin and a Band-Aid over this area. Past medical history includes type 2 diabetes, sick sinus syndrome, hypertension, coronary artery disease, pacemaker. He is not on oral anticoagulants ABI in our clinic was 1.26 on the left 2/23; patient who presented to clinic last week having a minor trauma to his leg however he required admitting to the hospital for treatment of left leg cellulitis. Small wound on the left posterior calf however completely necrotic last week requiring an aggressive debridement. We put him in 3 layer compression with Iodoflex. He tolerated this well 3/2 patient continues to have a small wound on the left posterior calf however still with a mostly necrotic surface. Still requires debridement we have been using Iodoflex under compression. Original wound was minor trauma complicated by cellulitis earlier this month. 07/11/2020 upon evaluation today patient appears to be doing well with regard to his wound on  the posterior leg. We have been using Iodoflex which I do feel like it is helpful for him. Is helping to clean up the wound bed. With that being said there is little bit of irritation around the edges of the wound I think the possible using a little Desitin here will help protect that region. Overall I think the cellulitis has improved and that is doing quite well. My main issue that I see is just that of slough buildup in the middle portion of the wound. 3/16; left posterior lower leg. Much better wound surface with a rim of surrounding epithelialization. We have been using Iodoflex but I felt we could change him to [facility] today 3/23; left posterior lower leg continued improvement in surface area. Nice rim of surrounding epithelialization. Even under illumination the wound looks healthy Electronic Signature(s) Signed: 07/25/2020 5:15:48 PM By: Linton Ham MD Entered By: Linton Ham on 07/25/2020 16:47:03 [name] ([id number]) -------------------------------------------------------------------------------- Physical Exam Details Patient Name: [name], Gary A. Date of Service: 07/25/2020 2:45 PM Medical Record Number: [id number] Patient Account Number: [id number] Date of Birth/Sex: 1946/05/03 (75 y.o. M) Treating RN: [name] Primary Care Provider: [name] Other Clinician: [name] Referring Provider: [name] Treating Provider/Extender: [name]: 5 Constitutional Patient is hypertensive.. Pulse regular and within target range for patient.Marland Kitchen Respirations regular, non-labored and within target range.. Temperature is normal and within the target range for the patient.Marland Kitchen appears in no distress. Cardiovascular Pedal pulses palpable. Notes Wound exam; area on the left posterior calf. Even under illumination the posterior calf wound on the left is clean looking. Nice rim of epithelialization from the inferior aspect. Base of the  wound appears healthy no  debridement required. No evidence of surrounding infection Electronic Signature(s) Signed: 07/25/2020 5:15:48 PM By: Linton Ham MD Entered By: Linton Ham on 07/25/2020 16:48:58 [name] ([id number]) -------------------------------------------------------------------------------- Physician Orders Details Patient Name: Gary Merlin A. Date of Service: 07/25/2020 2:45 PM Medical Record Number: [id number] Patient Account Number: [id number] Date of Birth/Sex: [birth year] (75 y.o. M) Treating RN: [name] Primary Care Provider: [name] Other Clinician: [name] Referring Provider: [name] Treating Provider/Extender: [name]: 5 Verbal / Phone Orders: No Diagnosis Coding Follow-up Appointments o Return Appointment in 1 week. o Nurse Visit as needed [employer] o Wash wounds with antibacterial soap and water. o May shower with wound dressing protected with water repellent cover or cast protector. Anesthetic (Use 'Patient Medications' Section for Anesthetic Order Entry) o Lidocaine applied to wound bed Edema Control - Lymphedema / Segmental Compressive Device / Other Left Lower Extremity o Optional: One layer of unna paste to top of compression wrap (to act as an anchor). o Elevate, Exercise Daily and Avoid Standing for Long Periods of Time. o Elevate legs to the level of the heart and pump ankles as often as possible o Elevate leg(s) parallel to the floor when sitting. Wound Treatment Wound #1 - Lower Leg Wound Laterality: Left, Posterior Peri-Wound Care: Desitin Maximum Strength Ointment 4 (oz) Secondary Dressing: ABD Pad 5x9 (in/in) Discharge Instructions: Cover with ABD pad Secondary Dressing: Hydrofera Blue Ready Transfer Foam, 2.5x2.5 (in/in) Discharge Instructions: Apply to wound bed over non-stick dressing. Compression Wrap: Profore Lite LF 3 Multilayer  Compression Bandaging System Discharge Instructions: Apply 3 multi-layer wrap as prescribed. Electronic Signature(s) Signed: 07/25/2020 5:15:48 PM By: Linton Ham MD Signed: 07/25/2020 6:23:16 PM By: [name], [name], RN, [name], [name] RN, [name] Entered By: [name], [name], RN, [name], [name] on 07/25/2020 15:59:03 [name] ([id number]) -------------------------------------------------------------------------------- Problem List Details Patient Name: [name], GADDIE. Date of Service: 07/25/2020 2:45 PM Medical Record Number: [id number] Patient Account Number: [id number] Date of Birth/Sex: 1945-09-24 (75 y.o. M) Treating RN: [name] Primary Care Provider: [name] Other Clinician: [name] Referring Provider: [name] Treating Provider/Extender: [name]: 5 Active Problems ICD-10 Encounter Code Description Active Date MDM Diagnosis S80.812D Abrasion, left lower leg, subsequent encounter 06/20/2020 No Yes L97.228 Non-pressure chronic ulcer of left calf with other specified severity 06/20/2020 No Yes I87.312 Chronic venous hypertension (idiopathic) with ulcer of left lower 06/20/2020 No Yes extremity Inactive Problems Resolved Problems Electronic Signature(s) Signed: 07/25/2020 5:15:48 PM By: Linton Ham MD Entered By: Linton Ham on 07/25/2020 16:45:19 [name] ([id number]) -------------------------------------------------------------------------------- Progress Note Details Patient Name: Gary Merlin A. Date of Service: 07/25/2020 2:45 PM Medical Record Number: [id number] Patient Account Number: [id number] Date of Birth/Sex: 24-Dec-1945 (75 y.o. 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 He is on his feet for long hours. He tells [place] that in early February he scratched himself with his fingernails on his posterior calf unbeknownst to him causing a wound. He was admitted to hospital from 06/11/2020 through 06/13/2020 with left leg cellulitis. Blood cultures were negative and ultrasound was negative for DVT and no abscess was found. He has completed antibiotics. He is using a Neosporin and a Band-Aid over this area.  Past medical history includes type 2 diabetes, sick sinus syndrome, hypertension, coronary artery disease, pacemaker. He is not on oral anticoagulants ABI in our clinic was 1.26 on the left 2/23; patient who presented to clinic last week having a minor trauma to his leg however he required admitting to the hospital for treatment of left leg cellulitis. Small wound on the left posterior calf however completely necrotic last week requiring an aggressive debridement. We put him in 3 layer compression with Iodoflex. He tolerated this well 3/2 patient continues to have a small wound on the left posterior calf however still with a mostly necrotic surface. Still requires debridement we have been using Iodoflex under compression. Original wound was minor trauma complicated by cellulitis earlier this month. 07/11/2020 upon evaluation today patient appears to be doing well with regard to his wound on the posterior leg. We have been using Iodoflex which I do feel like it is helpful for him. Is helping to clean up the wound bed. With that being said there is little bit of irritation around the edges of the wound I think the possible using a little Desitin here will help protect that region. Overall I think the cellulitis has improved and that is doing quite well. My main issue that I see is just that of slough buildup in the middle portion of the wound. 3/16; left posterior lower leg. Much better wound surface with a rim of surrounding  epithelialization. We have been using Iodoflex but I felt we could change him to [facility] today 3/23; left posterior lower leg continued improvement in surface area. Nice rim of surrounding epithelialization. Even under illumination the wound looks healthy Objective Constitutional Patient is hypertensive.. Pulse regular and within target range for patient.Marland Kitchen Respirations regular, non-labored and within target range.. Temperature is normal and within the target range for the patient.Marland Kitchen appears in no distress. Vitals Time Taken: 3:11 PM, Height: 75 in, Weight: 239 lbs, BMI: 29.9, Temperature: 98.0 F, Pulse: 79 bpm, Respiratory Rate: 18 breaths/min, Blood Pressure: 155/97 mmHg. Cardiovascular Pedal pulses palpable. General Notes: Wound exam; area on the left posterior calf. Even under illumination the posterior calf wound on the left is clean looking. Nice rim of epithelialization from the inferior aspect. Base of the wound appears healthy no debridement required. No evidence of surrounding infection Integumentary (Hair, Skin) Wound #1 status is Open. Original cause of wound was Skin Tear/Laceration. The date acquired was: 06/07/2020. The wound has been in treatment 5 weeks. The wound is located on the Left,Posterior Lower Leg. The wound measures 0.9cm length x 0.8cm width x 0.2cm depth; 0.565cm^2 area and 0.113cm^3 volume. There is Fat Layer (Subcutaneous Tissue) exposed. There is no tunneling or undermining noted. There is a medium amount of sanguinous drainage noted. There is large (67-100%) red granulation within the wound bed. There is no necrotic tissue within the wound bed. [name], [name] ([id number]) Assessment Active Problems ICD-10 Abrasion, left lower leg, subsequent encounter Non-pressure chronic ulcer of left calf with other specified severity Chronic venous hypertension (idiopathic) with ulcer of left lower extremity Procedures Wound #1 Pre-procedure diagnosis of Wound #1  is a Trauma, Other located on the Left,Posterior Lower Leg . There was a Three Layer Compression Therapy Procedure with a pre-treatment ABI of 1.3 by [name], RN. Post procedure Diagnosis Wound #1: Same as Pre-Procedure Plan Follow-up Appointments: Return Appointment in 1 week. Nurse Visit as needed Bathing/ Shower/ Hygiene: Wash wounds with antibacterial soap and water. May shower with wound dressing protected  with water repellent cover or cast protector. Anesthetic (Use 'Patient Medications' Section for Anesthetic Order Entry): Lidocaine applied to wound bed Edema Control - Lymphedema / Segmental Compressive Device / Other: Optional: One layer of unna paste to top of compression wrap (to act as an anchor). Elevate, Exercise Daily and Avoid Standing for Long Periods of Time. Elevate legs to the level of the heart and pump ankles as often as possible Elevate leg(s) parallel to the floor when sitting. WOUND #1: - Lower Leg Wound Laterality: Left, Posterior Peri-Wound Care: Desitin Maximum Strength Ointment 4 (oz) Secondary Dressing: ABD Pad 5x9 (in/in) Discharge Instructions: Cover with ABD pad Secondary Dressing: Hydrofera Blue Ready Transfer Foam, 2.5x2.5 (in/in) Discharge Instructions: Apply to wound bed over non-stick dressing. Compression Wrap: Profore Lite LF 3 Multilayer Compression Bandaging System Discharge Instructions: Apply 3 multi-layer wrap as prescribed. 1. No change of the primary dressing which is Hydrofera Blue under 3 layer compression Electronic Signature(s) Signed: 07/25/2020 5:15:48 PM By: Linton Ham MD Entered By: Linton Ham on 07/25/2020 16:50:37 [name] ([id number]) -------------------------------------------------------------------------------- SuperBill Details Patient Name: Gary Merlin A. Date of Service: 07/25/2020 Medical Record Number: [id number] Patient Account Number: [id number] Date of Birth/Sex: 30-Aug-1945 (74 y.o. M) Treating RN:  [name] Primary Care Provider: [name] Other Clinician: [name] Referring Provider: [name] Treating Provider/Extender: [name]: 5 Diagnosis Coding ICD-10 Codes Code Description [phone] Abrasion, left lower leg, subsequent encounter L97.228 Non-pressure chronic ulcer of left calf with other specified severity I87.312 Chronic venous hypertension (idiopathic) with ulcer of left lower extremity Facility Procedures CPT4 Code: [id number] Description: (Facility Use Only) [id number] - Frost LWR LT LEG Modifier: Quantity: 1 Physician Procedures CPT4 Code: [id number] Description: 33612 - WC PHYS LEVEL 3 - EST PT Modifier: Quantity: 1 CPT4 Code: Description: ICD-10 Diagnosis Description S80.812D Abrasion, left lower leg, subsequent encounter L97.228 Non-pressure chronic ulcer of left calf with other specified severity I87.312 Chronic venous hypertension (idiopathic) with ulcer of left lower extr Modifier: emity Quantity: Electronic Signature(s) Signed: 07/25/2020 5:15:48 PM By: Linton Ham MD Entered By: Linton Ham on 07/25/2020 16:50:58

## 2020-07-30 ENCOUNTER — Ambulatory Visit (INDEPENDENT_AMBULATORY_CARE_PROVIDER_SITE_OTHER): Payer: Medicare HMO | Admitting: Hospice and Palliative Medicine

## 2020-07-30 ENCOUNTER — Encounter: Payer: Self-pay | Admitting: Hospice and Palliative Medicine

## 2020-07-30 ENCOUNTER — Other Ambulatory Visit: Payer: Self-pay

## 2020-07-30 VITALS — BP 140/80 | HR 75 | Temp 97.3°F | Resp 16 | Ht 75.5 in | Wt 235.8 lb

## 2020-07-30 DIAGNOSIS — R5383 Other fatigue: Secondary | ICD-10-CM | POA: Diagnosis not present

## 2020-07-30 DIAGNOSIS — Z125 Encounter for screening for malignant neoplasm of prostate: Secondary | ICD-10-CM

## 2020-07-30 DIAGNOSIS — Z0001 Encounter for general adult medical examination with abnormal findings: Secondary | ICD-10-CM | POA: Diagnosis not present

## 2020-07-30 DIAGNOSIS — I10 Essential (primary) hypertension: Secondary | ICD-10-CM | POA: Diagnosis not present

## 2020-07-30 DIAGNOSIS — R300 Dysuria: Secondary | ICD-10-CM | POA: Diagnosis not present

## 2020-07-30 DIAGNOSIS — L03116 Cellulitis of left lower limb: Secondary | ICD-10-CM

## 2020-07-30 MED ORDER — ACCU-CHEK SOFTCLIX LANCETS MISC: 3 refills | Status: DC

## 2020-07-30 MED ORDER — ACCU-CHEK GUIDE VI STRP: ORAL_STRIP | 3 refills | Status: DC

## 2020-07-30 NOTE — Progress Notes (Signed)
[facility], [place] [address]  Internal MEDICINE  Office Visit Note  Patient Name: [name]  [id number]  [id number]  Date of Service: 07/31/2020  Chief Complaint  Patient presents with  . Medicare Wellness    Sore on left leg still not healed  . Diabetes  . Gastroesophageal Reflux  . Hyperlipidemia  . Hypertension  . Quality Metric Gaps    Eye exam, flu shot was done pt doesn't remember date      HPI Pt is here for routine health maintenance examination Being followed by wound clinic weekly for left leg cellulitis and open wound, per wound clinic notes healing status is progressing well without complication Has completed inpatient and outpatient antibiotic therapy Unable to assess wound as it has been wrapped by wound clinic and will not be seen again until next week Besides wound, no acute complaints or concerns  Last A1C in February well controlled   Had screening colonoscopy in September, recommend repeat in 10 years  Appetite remains stable, no issues with constipation, continues to sleep well each night without diffculty  Current Medication: Outpatient Encounter Medications as of 07/30/2020  Medication Sig  . allopurinol (ZYLOPRIM) 300 MG tablet TAKE 1 TABLET BY MOUTH AT BEDTIME FOR GOUT  . aspirin EC 81 MG tablet Take 81 mg by mouth daily. Swallow whole.  . colchicine 0.6 MG tablet TAKE 1 TAB BY MOUTH TWICE DAILY X 3 DAYS FOR GOUT FLARE UP. OTHERWISE ONCE DAILY FOR PREVENTION  . diltiazem (CARTIA XT) 240 MG 24 hr capsule Take 1 capsule (240 mg total) by mouth daily.  Marland Kitchen losartan-hydrochlorothiazide (HYZAAR) 100-12.5 MG tablet TAKE 1 TABLET BY MOUTH EVERY DAY  . metFORMIN (GLUCOPHAGE) 500 MG tablet Take 1 tablet (500 mg total) by mouth 2 (two) times daily with a meal.  . metoprolol succinate (TOPROL-XL) 50 MG 24 hr tablet Take 50 mg by mouth daily.  . potassium chloride SA (KLOR-CON) 20 MEQ tablet Take 1 tablet by mouth  daily.  . simvastatin (ZOCOR) 10 MG tablet TAKE 1 TABLET BY MOUTH EVERY DAY  . sucralfate (CARAFATE) 1 g tablet Take 1 tablet (1 g total) by mouth 4 (four) times daily. (Patient taking differently: No sig reported)  . tamsulosin (FLOMAX) 0.4 MG CAPS capsule TAKE 1 CAPSULE BY MOUTH EVERY DAY   No facility-administered encounter medications on file as of 07/30/2020.    Surgical History: Past Surgical History:  Procedure Laterality Date  . CARDIAC CATHETERIZATION    . COLONOSCOPY WITH PROPOFOL N/A 01/24/2020   Procedure: COLONOSCOPY WITH PROPOFOL;  Surgeon: [name], MD;  Location: [facility] ENDOSCOPY;  Service: Gastroenterology;  Laterality: N/A;  . CORONARY/GRAFT ACUTE MI REVASCULARIZATION N/A 05/02/2018   Procedure: Coronary/Graft Acute MI Revascularization;  Surgeon: [name], MD;  Location: [place] CV LAB;  Service: Cardiovascular;  Laterality: N/A;  . HERNIA REPAIR    . LEFT HEART CATH AND CORONARY ANGIOGRAPHY N/A 05/02/2018   Procedure: LEFT HEART CATH AND CORONARY ANGIOGRAPHY;  Surgeon: [name], MD;  Location: [place] [facility];  Service: Cardiovascular;  Laterality: N/A;  . PACEMAKER INSERTION Left 05/03/2018   Procedure: INSERTION PACEMAKER;  Surgeon: [name], MD;  Location: [facility] ORS;  Service: Cardiovascular;  Laterality: Left;  . TEMPORARY PACEMAKER N/A 05/02/2018   Procedure: TEMPORARY PACEMAKER;  Surgeon: [name], MD;  Location: [place] [facility];  Service: Cardiovascular;  Laterality: N/A;  . TEMPORARY PACEMAKER Right 05/03/2018   Procedure: TEMPORARY  PACEMAKER REMOVAL;  Surgeon: [name], MD;  Location: [facility] ORS;  Service: Cardiovascular;  Laterality: Right;  . TONSILLECTOMY Bilateral    1992    Medical History: Past Medical History:  Diagnosis Date  . Diabetes mellitus without complication ([place])   . GERD (gastroesophageal reflux disease)   . Hyperlipidemia   . Hypertension     Family History: Family  History  Problem Relation Age of Onset  . Cirrhosis Father   . Bone cancer Mother       Review of Systems  Constitutional: Negative for chills, fatigue and unexpected weight change.  HENT: Negative for congestion, postnasal drip, rhinorrhea, sneezing and sore throat.   Eyes: Negative for redness.  Respiratory: Negative for cough, chest tightness and shortness of breath.   Cardiovascular: Negative for chest pain and palpitations.  Gastrointestinal: Negative for abdominal pain, constipation, diarrhea, nausea and vomiting.  Genitourinary: Negative for dysuria and frequency.  Musculoskeletal: Negative for arthralgias, back pain, joint swelling and neck pain.  Skin: Negative for rash.  Neurological: Negative for tremors and numbness.  Hematological: Negative for adenopathy. Does not bruise/bleed easily.  Psychiatric/Behavioral: Negative for behavioral problems (Depression), sleep disturbance and suicidal ideas. The patient is not nervous/anxious.      Vital Signs: BP 140/80   Pulse 75   Temp (!) 97.3 F (36.3 C)   Resp 16   Ht 6' 3.5" (1.918 m)   Wt 235 lb 12.8 oz (107 kg)   SpO2 98%   BMI 29.08 kg/m    Physical Exam Vitals reviewed.  Constitutional:      Appearance: Normal appearance. He is normal weight.  Cardiovascular:     Rate and Rhythm: Normal rate and regular rhythm.     Pulses: Normal pulses.     Heart sounds: Normal heart sounds.  Pulmonary:     Effort: Pulmonary effort is normal.     Breath sounds: Normal breath sounds.  Abdominal:     General: Abdomen is flat.     Palpations: Abdomen is soft.  Musculoskeletal:        General: Normal range of motion.     Cervical back: Normal range of motion.  Skin:    General: Skin is warm.  Neurological:     General: No focal deficit present.     Mental Status: He is alert and oriented to person, place, and time. Mental status is at baseline.  Psychiatric:        Mood and Affect: Mood normal.        Behavior:  Behavior normal.        Thought Content: Thought content normal.        Judgment: Judgment normal.      LABS: Recent Results (from the past 2160 hour(s))  POCT glycosylated hemoglobin (Hb A1C)     Status: Abnormal   Collection Time: 06/07/20 12:30 PM  Result Value Ref Range   Hemoglobin A1C 6.6 (A) 4.0 - 5.6 %   HbA1c POC (<> result, manual entry)     HbA1c, POC (prediabetic range)     HbA1c, POC (controlled diabetic range)    Lactic acid, plasma     Status: None   Collection Time: 06/11/20  1:13 PM  Result Value Ref Range   Lactic Acid, Venous 1.4 0.5 - 1.9 mmol/L    Comment: Performed at [facility], [address] [place], [place] [address]  Comprehensive metabolic panel     Status: Abnormal   Collection Time: 06/11/20  1:13 PM  Result Value Ref Range   Sodium 139 135 - 145 mmol/L   Potassium 4.3 3.5 - 5.1 mmol/L   Chloride 100 98 - 111 mmol/L   CO2 27 22 - 32 mmol/L   Glucose, Bld 126 (H) 70 - 99 mg/dL    Comment: Glucose reference range applies only to samples taken after fasting for at least 8 hours.   BUN 22 8 - 23 mg/dL   Creatinine, Ser 1.00 0.61 - 1.24 mg/dL   Calcium 9.7 8.9 - 10.3 mg/dL   Total Protein 7.5 6.5 - 8.1 g/dL   Albumin 4.0 3.5 - 5.0 g/dL   AST 16 15 - 41 U/L   ALT 19 0 - 44 U/L   Alkaline Phosphatase 78 38 - 126 U/L   Total Bilirubin 0.7 0.3 - 1.2 mg/dL   GFR, Estimated >60 >60 mL/min    Comment: (NOTE) Calculated using the CKD-EPI Creatinine Equation (2021)    Anion gap 12 5 - 15    Comment: Performed at [facility], [address] [place], [place] [address]  CBC with Differential     Status: None   Collection Time: 06/11/20  1:13 PM  Result Value Ref Range   WBC 6.0 4.0 - 10.5 K/uL   RBC 5.72 4.22 - 5.81 MIL/uL   Hemoglobin 15.2 13.0 - 17.0 g/dL   HCT 47.5 39.0 - 52.0 %   MCV 83.0 80.0 - 100.0 fL   MCH 26.6 26.0 - 34.0 pg   MCHC 32.0 30.0 - 36.0 g/dL   RDW 14.3 11.5 - 15.5 %   Platelets 219 150 - 400 K/uL    nRBC 0.0 0.0 - 0.2 %   Neutrophils Relative % 66 %   Neutro Abs 3.9 1.7 - 7.7 K/uL   Lymphocytes Relative 22 %   Lymphs Abs 1.3 0.7 - 4.0 K/uL   Monocytes Relative 9 %   Monocytes Absolute 0.5 0.1 - 1.0 K/uL   Eosinophils Relative 2 %   Eosinophils Absolute 0.1 0.0 - 0.5 K/uL   Basophils Relative 1 %   Basophils Absolute 0.1 0.0 - 0.1 K/uL   Immature Granulocytes 0 %   Abs Immature Granulocytes 0.02 0.00 - 0.07 K/uL    Comment: Performed at [facility], [address] [place], [place] [address]  Blood culture (routine x 2)     Status: None   Collection Time: 06/11/20  8:09 PM   Specimen: BLOOD  Result Value Ref Range   Specimen Description BLOOD BLOOD RIGHT HAND    Special Requests      BOTTLES DRAWN AEROBIC AND ANAEROBIC Blood Culture adequate volume   Culture      NO GROWTH 5 DAYS Performed at [facility], [address] [place], [place] [address]    Report Status 06/16/2020 FINAL   Blood culture (routine x 2)     Status: None   Collection Time: 06/11/20  8:22 PM   Specimen: BLOOD  Result Value Ref Range   Specimen Description BLOOD RIGHT ANTECUBITAL    Special Requests      BOTTLES DRAWN AEROBIC AND ANAEROBIC Blood Culture adequate volume   Culture      NO GROWTH 5 DAYS Performed at [facility], [address] [place], [place] [address]    Report Status 06/16/2020 FINAL   CBC     Status: None   Collection Time: 06/11/20  8:23 PM  Result Value Ref Range   WBC 6.3 4.0 - 10.5  K/uL   RBC 5.53 4.22 - 5.81 MIL/uL   Hemoglobin 14.9 13.0 - 17.0 g/dL   HCT 44.9 39.0 - 52.0 %   MCV 81.2 80.0 - 100.0 fL   MCH 26.9 26.0 - 34.0 pg   MCHC 33.2 30.0 - 36.0 g/dL   RDW 14.1 11.5 - 15.5 %   Platelets 203 150 - 400 K/uL   nRBC 0.0 0.0 - 0.2 %    Comment: Performed at [facility], [address] [place], [place] [address]  Creatinine, serum     Status: None   Collection Time: 06/11/20  8:23 PM  Result Value Ref Range   Creatinine,  Ser 0.83 0.61 - 1.24 mg/dL   GFR, Estimated >60 >60 mL/min    Comment: (NOTE) Calculated using the CKD-EPI Creatinine Equation (2021) Performed at [facility], [address] [place], [place] [address]   TSH     Status: None   Collection Time: 06/11/20  8:23 PM  Result Value Ref Range   TSH 0.714 0.350 - 4.500 uIU/mL    Comment: Performed by a 3rd Generation assay with a functional sensitivity of <=0.01 uIU/mL. Performed at [facility], [address] [place], [place] [address]   CK     Status: None   Collection Time: 06/11/20  8:23 PM  Result Value Ref Range   Total CK 70 49 - 397 U/L    Comment: Performed at [facility], [address] [place], [place] [address]  Lactic acid, plasma     Status: None   Collection Time: 06/11/20  8:23 PM  Result Value Ref Range   Lactic Acid, Venous 1.6 0.5 - 1.9 mmol/L    Comment: Performed at [facility], [address] [place],  [address]  Procalcitonin - Baseline     Status: None   Collection Time: 06/11/20  8:23 PM  Result Value Ref Range   Procalcitonin <0.10 ng/mL    Comment:        Interpretation: PCT (Procalcitonin) <= 0.5 ng/mL: Systemic infection (sepsis) is not likely. Local bacterial infection is possible. (NOTE)       Sepsis PCT Algorithm           Lower Respiratory Tract                                      Infection PCT Algorithm    ----------------------------     ----------------------------         PCT < 0.25 ng/mL                PCT < 0.10 ng/mL          Strongly encourage             Strongly discourage   discontinuation of antibiotics    initiation of antibiotics    ----------------------------     -----------------------------       PCT 0.25 - 0.50 ng/mL            PCT 0.10 - 0.25 ng/mL               OR       >80% decrease in PCT            Discourage initiation of  antibiotics      Encourage discontinuation            of antibiotics    ----------------------------     -----------------------------         PCT >= 0.50 ng/mL              PCT 0.26 - 0.50 ng/mL               AND        <80% decrease in PCT             Encourage initiation of                                             antibiotics       Encourage continuation           of antibiotics    ----------------------------     -----------------------------        PCT >= 0.50 ng/mL                  PCT > 0.50 ng/mL               AND         increase in PCT                  Strongly encourage                                      initiation of antibiotics    Strongly encourage escalation           of antibiotics                                     -----------------------------                                           PCT <= 0.25 ng/mL                                                 OR                                        > 80% decrease in PCT                                      Discontinue / Do not initiate                                             antibiotics  Performed at [facility], [address] [place],  [address]   SARS Coronavirus 2 by RT PCR (hospital order, performed in [place]  hospital lab) Nasopharyngeal Nasopharyngeal Swab     Status: None   Collection Time: 06/12/20 12:45 AM   Specimen: Nasopharyngeal Swab  Result Value Ref Range   SARS Coronavirus 2 NEGATIVE NEGATIVE    Comment: (NOTE) SARS-CoV-2 target nucleic acids are NOT DETECTED.  The SARS-CoV-2 RNA is generally detectable in upper and lower respiratory specimens during the acute phase of infection. The lowest concentration of SARS-CoV-2 viral copies this assay can detect is 250 copies / mL. A negative result does not preclude SARS-CoV-2 infection and should not be used as the sole basis for treatment or other patient management decisions.  A negative result may occur with improper specimen collection / handling, submission of specimen  other than nasopharyngeal swab, presence of viral mutation(s) within the areas targeted by this assay, and inadequate number of viral copies (<250 copies / mL). A negative result must be combined with clinical observations, patient history, and epidemiological information.  Fact Sheet for Patients:   StrictlyIdeas.no  Fact Sheet for Healthcare Providers: BankingDealers.co.za  This test is not yet approved or  cleared by the [place] FDA and has been authorized for detection and/or diagnosis of SARS-CoV-2 by FDA under an Emergency Use Authorization (EUA).  This EUA will remain in effect (meaning this test can be used) for the duration of the COVID-19 declaration under Section 564(b)(1) of the Act, 21 U.S.C. section 360bbb-3(b)(1), unless the authorization is terminated or revoked sooner.  Performed at [facility], [address] [place], [place] [address]   CBG monitoring, ED     Status: Abnormal   Collection Time: 06/12/20  1:21 AM  Result Value Ref Range   Glucose-Capillary 185 (H) 70 - 99 mg/dL    Comment: Glucose reference range applies only to samples taken after fasting for at least 8 hours.  Urinalysis, Routine w reflex microscopic     Status: Abnormal   Collection Time: 06/12/20  1:27 AM  Result Value Ref Range   Color, Urine YELLOW (A) YELLOW   APPearance CLEAR (A) CLEAR   Specific Gravity, Urine 1.030 1.005 - 1.030   pH 6.0 5.0 - 8.0   Glucose, UA NEGATIVE NEGATIVE mg/dL   Hgb urine dipstick NEGATIVE NEGATIVE   Bilirubin Urine NEGATIVE NEGATIVE   Ketones, ur NEGATIVE NEGATIVE mg/dL   Protein, ur NEGATIVE NEGATIVE mg/dL   Nitrite NEGATIVE NEGATIVE   Leukocytes,Ua NEGATIVE NEGATIVE    Comment: Performed at [facility], [address] [place],  [address]  Comprehensive metabolic panel     Status: Abnormal   Collection Time: 06/12/20  4:44 AM  Result Value Ref Range   Sodium 135 135 -  145 mmol/L   Potassium 3.6 3.5 - 5.1 mmol/L   Chloride 97 (L) 98 - 111 mmol/L   CO2 26 22 - 32 mmol/L   Glucose, Bld 180 (H) 70 - 99 mg/dL    Comment: Glucose reference range applies only to samples taken after fasting for at least 8 hours.   BUN 20 8 - 23 mg/dL   Creatinine, Ser 0.82 0.61 - 1.24 mg/dL   Calcium 9.7 8.9 - 10.3 mg/dL   Total Protein 6.5 6.5 - 8.1 g/dL   Albumin 3.3 (L) 3.5 - 5.0 g/dL   AST 16 15 - 41 U/L   ALT 18 0 - 44 U/L   Alkaline Phosphatase 73 38 - 126 U/L   Total Bilirubin 0.8 0.3 - 1.2 mg/dL   GFR, Estimated >60 >60 mL/min    Comment: (  NOTE) Calculated using the CKD-EPI Creatinine Equation (2021)    Anion gap 12 5 - 15    Comment: Performed at [facility], [address] [place], [place] [address]  CBC     Status: None   Collection Time: 06/12/20  4:44 AM  Result Value Ref Range   WBC 5.9 4.0 - 10.5 K/uL   RBC 5.60 4.22 - 5.81 MIL/uL   Hemoglobin 15.4 13.0 - 17.0 g/dL   HCT 45.5 39.0 - 52.0 %   MCV 81.3 80.0 - 100.0 fL   MCH 27.5 26.0 - 34.0 pg   MCHC 33.8 30.0 - 36.0 g/dL   RDW 14.1 11.5 - 15.5 %   Platelets 203 150 - 400 K/uL   nRBC 0.0 0.0 - 0.2 %    Comment: Performed at [facility], [address] [place], [place] [address]  Protime-INR     Status: None   Collection Time: 06/12/20  4:44 AM  Result Value Ref Range   Prothrombin Time 13.7 11.4 - 15.2 seconds   INR 1.1 0.8 - 1.2    Comment: (NOTE) INR goal varies based on device and disease states. Performed at [facility], [address] [place], [place] [address]   Procalcitonin     Status: None   Collection Time: 06/12/20  4:44 AM  Result Value Ref Range   Procalcitonin <0.10 ng/mL    Comment:        Interpretation: PCT (Procalcitonin) <= 0.5 ng/mL: Systemic infection (sepsis) is not likely. Local bacterial infection is possible. (NOTE)       Sepsis PCT Algorithm           Lower Respiratory Tract                                      Infection PCT  Algorithm    ----------------------------     ----------------------------         PCT < 0.25 ng/mL                PCT < 0.10 ng/mL          Strongly encourage             Strongly discourage   discontinuation of antibiotics    initiation of antibiotics    ----------------------------     -----------------------------       PCT 0.25 - 0.50 ng/mL            PCT 0.10 - 0.25 ng/mL               OR       >80% decrease in PCT            Discourage initiation of                                            antibiotics      Encourage discontinuation           of antibiotics    ----------------------------     -----------------------------         PCT >= 0.50 ng/mL              PCT 0.26 - 0.50 ng/mL               AND        <  80% decrease in PCT             Encourage initiation of                                             antibiotics       Encourage continuation           of antibiotics    ----------------------------     -----------------------------        PCT >= 0.50 ng/mL                  PCT > 0.50 ng/mL               AND         increase in PCT                  Strongly encourage                                      initiation of antibiotics    Strongly encourage escalation           of antibiotics                                     -----------------------------                                           PCT <= 0.25 ng/mL                                                 OR                                        > 80% decrease in PCT                                      Discontinue / Do not initiate                                             antibiotics  Performed at [facility], [address] [place], [place] [address]   C-reactive protein     Status: Abnormal   Collection Time: 06/12/20  4:44 AM  Result Value Ref Range   CRP 2.5 (H) <1.0 mg/dL    Comment: Performed at [facility] [address] [place], [place] [address]  MRSA PCR Screening     Status:  None   Collection Time: 06/12/20  6:35 AM   Specimen: Nasopharyngeal  Result Value Ref Range   MRSA by PCR NEGATIVE NEGATIVE    Comment:        The GeneXpert MRSA  Assay (FDA approved for NASAL specimens only), is one component of a comprehensive MRSA colonization surveillance program. It is not intended to diagnose MRSA infection nor to guide or monitor treatment for MRSA infections. Performed at [facility], [address] [place], [place] [address]   Glucose, capillary     Status: Abnormal   Collection Time: 06/12/20  7:42 AM  Result Value Ref Range   Glucose-Capillary 163 (H) 70 - 99 mg/dL    Comment: Glucose reference range applies only to samples taken after fasting for at least 8 hours.  Glucose, capillary     Status: Abnormal   Collection Time: 06/12/20 11:47 AM  Result Value Ref Range   Glucose-Capillary 177 (H) 70 - 99 mg/dL    Comment: Glucose reference range applies only to samples taken after fasting for at least 8 hours.  Glucose, capillary     Status: Abnormal   Collection Time: 06/12/20  4:31 PM  Result Value Ref Range   Glucose-Capillary 123 (H) 70 - 99 mg/dL    Comment: Glucose reference range applies only to samples taken after fasting for at least 8 hours.  Glucose, capillary     Status: Abnormal   Collection Time: 06/12/20  9:48 PM  Result Value Ref Range   Glucose-Capillary 136 (H) 70 - 99 mg/dL    Comment: Glucose reference range applies only to samples taken after fasting for at least 8 hours.   Comment 1 Notify RN   Basic metabolic panel     Status: Abnormal   Collection Time: 06/13/20  5:01 AM  Result Value Ref Range   Sodium 135 135 - 145 mmol/L   Potassium 3.8 3.5 - 5.1 mmol/L   Chloride 99 98 - 111 mmol/L   CO2 24 22 - 32 mmol/L   Glucose, Bld 145 (H) 70 - 99 mg/dL    Comment: Glucose reference range applies only to samples taken after fasting for at least 8 hours.   BUN 21 8 - 23 mg/dL   Creatinine, Ser 0.83 0.61 - 1.24 mg/dL    Calcium 9.3 8.9 - 10.3 mg/dL   GFR, Estimated >60 >60 mL/min    Comment: (NOTE) Calculated using the CKD-EPI Creatinine Equation (2021)    Anion gap 12 5 - 15    Comment: Performed at [facility], [address] [place], [place] [address]  CBC     Status: None   Collection Time: 06/13/20  5:01 AM  Result Value Ref Range   WBC 6.0 4.0 - 10.5 K/uL   RBC 5.38 4.22 - 5.81 MIL/uL   Hemoglobin 14.6 13.0 - 17.0 g/dL   HCT 43.3 39.0 - 52.0 %   MCV 80.5 80.0 - 100.0 fL   MCH 27.1 26.0 - 34.0 pg   MCHC 33.7 30.0 - 36.0 g/dL   RDW 13.9 11.5 - 15.5 %   Platelets 214 150 - 400 K/uL   nRBC 0.0 0.0 - 0.2 %    Comment: Performed at [facility], [address] [place], [place] [address]  Magnesium     Status: None   Collection Time: 06/13/20  5:01 AM  Result Value Ref Range   Magnesium 1.8 1.7 - 2.4 mg/dL    Comment: Performed at [facility], [address] [place], [place] [address]  Glucose, capillary     Status: Abnormal   Collection Time: 06/13/20  8:07 AM  Result Value Ref Range   Glucose-Capillary 139 (H) 70 - 99 mg/dL  Comment: Glucose reference range applies only to samples taken after fasting for at least 8 hours.   Comment 1 Notify RN   Comprehensive metabolic panel     Status: Abnormal   Collection Time: 06/15/20  2:44 PM  Result Value Ref Range   Sodium 138 135 - 145 mmol/L   Potassium 3.9 3.5 - 5.1 mmol/L   Chloride 103 98 - 111 mmol/L   CO2 25 22 - 32 mmol/L   Glucose, Bld 115 (H) 70 - 99 mg/dL    Comment: Glucose reference range applies only to samples taken after fasting for at least 8 hours.   BUN 22 8 - 23 mg/dL   Creatinine, Ser 0.81 0.61 - 1.24 mg/dL   Calcium 9.6 8.9 - 10.3 mg/dL   Total Protein 7.6 6.5 - 8.1 g/dL   Albumin 3.8 3.5 - 5.0 g/dL   AST 18 15 - 41 U/L   ALT 21 0 - 44 U/L   Alkaline Phosphatase 76 38 - 126 U/L   Total Bilirubin 0.7 0.3 - 1.2 mg/dL   GFR, Estimated >60 >60 mL/min    Comment: (NOTE) Calculated  using the CKD-EPI Creatinine Equation (2021)    Anion gap 10 5 - 15    Comment: Performed at [facility], [address] [place], [place] [address]  CBC with Differential     Status: None   Collection Time: 06/15/20  2:44 PM  Result Value Ref Range   WBC 6.4 4.0 - 10.5 K/uL   RBC 5.46 4.22 - 5.81 MIL/uL   Hemoglobin 14.7 13.0 - 17.0 g/dL   HCT 44.6 39.0 - 52.0 %   MCV 81.7 80.0 - 100.0 fL   MCH 26.9 26.0 - 34.0 pg   MCHC 33.0 30.0 - 36.0 g/dL   RDW 14.1 11.5 - 15.5 %   Platelets 264 150 - 400 K/uL   nRBC 0.0 0.0 - 0.2 %   Neutrophils Relative % 61 %   Neutro Abs 3.9 1.7 - 7.7 K/uL   Lymphocytes Relative 26 %   Lymphs Abs 1.7 0.7 - 4.0 K/uL   Monocytes Relative 7 %   Monocytes Absolute 0.5 0.1 - 1.0 K/uL   Eosinophils Relative 4 %   Eosinophils Absolute 0.3 0.0 - 0.5 K/uL   Basophils Relative 1 %   Basophils Absolute 0.1 0.0 - 0.1 K/uL   Immature Granulocytes 1 %   Abs Immature Granulocytes 0.03 0.00 - 0.07 K/uL    Comment: Performed at [facility], [address] [place], [place] [address]  UA/M w/rflx Culture, Routine     Status: None   Collection Time: 07/30/20  2:40 PM   Specimen: Urine   Urine  Result Value Ref Range   Specific Gravity, UA 1.019 1.005 - 1.030   pH, UA 6.0 5.0 - 7.5   Color, UA Yellow Yellow   Appearance Ur Clear Clear   Leukocytes,UA Negative Negative   Protein,UA Negative Negative/Trace   Glucose, UA Negative Negative   Ketones, UA Negative Negative   RBC, UA Negative Negative   Bilirubin, UA Negative Negative   Urobilinogen, Ur 0.2 0.2 - 1.0 mg/dL   Nitrite, UA Negative Negative   Microscopic Examination Comment     Comment: Microscopic follows if indicated.   Microscopic Examination See below:     Comment: Microscopic was indicated and was performed.   Urinalysis Reflex Comment     Comment: This specimen will not reflex to a Urine Culture.  Microscopic  Examination     Status: None   Collection Time: 07/30/20  2:40  PM   Urine  Result Value Ref Range   WBC, UA None seen 0 - 5 /hpf   RBC None seen 0 - 2 /hpf   Epithelial Cells (non renal) None seen 0 - 10 /hpf   Casts None seen None seen /lpf   Bacteria, UA None seen None seen/Few   Assessment/Plan: 1. Encounter for routine adult health examination with abnormal findings Well appearing 75 year old AA male Will review routine fasting labs and adjust plan of care as indicated  2. Screening for prostate cancer - PSA, total and free  3. Essential hypertension BP and HR well controlled, continue to monitor  4. Left leg cellulitis Completed antibiotic therapy, followed and managed by wound clinic weekly  5. Other fatigue - CBC w/Diff/Platelet - Comprehensive Metabolic Panel (CMET) - Lipid Panel With LDL/HDL Ratio - TSH + free T4 - PSA, total and free  6. Dysuria - UA/M w/rflx Culture, Routine - Microscopic Examination  General Counseling: demetrias goodbar understanding of the findings of todays visit and agrees with plan of treatment. I have discussed any further diagnostic evaluation that may be needed or ordered today. We also reviewed his medications today. he has been encouraged to call the office with any questions or concerns that should arise related to todays visit.    Counseling:    Orders Placed This Encounter  Procedures  . Microscopic Examination  . CBC w/Diff/Platelet  . Comprehensive Metabolic Panel (CMET)  . Lipid Panel With LDL/HDL Ratio  . TSH + free T4  . PSA, total and free  . UA/M w/rflx Culture, Routine    Total time spent: 30 Minutes  Time spent includes review of chart, medications, test results, and follow up plan with the patient.   This patient was seen by [name] AGNP-C Collaboration with Dr Lavera Guise as a part of collaborative care agreement   Tanna Furry. [facility]

## 2020-07-31 ENCOUNTER — Encounter: Payer: Self-pay | Admitting: Hospice and Palliative Medicine

## 2020-07-31 LAB — UA/M W/RFLX CULTURE, ROUTINE
Bilirubin, UA: NEGATIVE
Glucose, UA: NEGATIVE
Ketones, UA: NEGATIVE
Leukocytes,UA: NEGATIVE
Nitrite, UA: NEGATIVE
Protein,UA: NEGATIVE
RBC, UA: NEGATIVE
Specific Gravity, UA: 1.019 (ref 1.005–1.030)
Urobilinogen, Ur: 0.2 mg/dL (ref 0.2–1.0)
pH, UA: 6 (ref 5.0–7.5)

## 2020-07-31 LAB — MICROSCOPIC EXAMINATION
Bacteria, UA: NONE SEEN
Casts: NONE SEEN /lpf
Epithelial Cells (non renal): NONE SEEN /hpf (ref 0–10)
RBC, Urine: NONE SEEN /hpf (ref 0–2)
WBC, UA: NONE SEEN /hpf (ref 0–5)

## 2020-08-01 ENCOUNTER — Encounter: Payer: Medicare HMO | Admitting: Internal Medicine

## 2020-08-01 ENCOUNTER — Other Ambulatory Visit: Payer: Self-pay

## 2020-08-01 DIAGNOSIS — L97222 Non-pressure chronic ulcer of left calf with fat layer exposed: Secondary | ICD-10-CM | POA: Diagnosis not present

## 2020-08-01 DIAGNOSIS — L03116 Cellulitis of left lower limb: Secondary | ICD-10-CM | POA: Diagnosis not present

## 2020-08-01 DIAGNOSIS — Z950 Presence of cardiac pacemaker: Secondary | ICD-10-CM | POA: Diagnosis not present

## 2020-08-01 DIAGNOSIS — E11622 Type 2 diabetes mellitus with other skin ulcer: Secondary | ICD-10-CM | POA: Diagnosis not present

## 2020-08-01 DIAGNOSIS — L97228 Non-pressure chronic ulcer of left calf with other specified severity: Secondary | ICD-10-CM | POA: Diagnosis not present

## 2020-08-02 ENCOUNTER — Other Ambulatory Visit: Payer: Self-pay | Admitting: Internal Medicine

## 2020-08-02 ENCOUNTER — Other Ambulatory Visit: Payer: Self-pay | Admitting: Physician Assistant

## 2020-08-02 NOTE — Progress Notes (Signed)
OM, LIZOTTE ([id number]) Visit Report for 08/01/2020 Arrival Information Details Patient Name: [name], [name]. Date of Service: 08/01/2020 11:15 AM Medical Record Number: [id number] Patient Account Number: [id number] Date of Birth/Sex: 1945-06-26 (75 y.o. M) Treating Bishop: [name] Primary Care [name]: [name] Other Clinician: Jeanine Bishop Referring [name]: [name] Treating Taysen Bushart/Extender: [name]: 6 Visit Information History Since Last Visit Added or deleted any medications: No Patient Arrived: Ambulatory Had a fall or experienced change in No Arrival Time: 11:15 activities of daily living that may affect Accompanied By: self risk of falls: Transfer Assistance: None Hospitalized since last visit: No Patient Identification Verified: Yes Pain Present Now: No Secondary Verification Process Completed: Yes Electronic Signature(s) Signed: 08/01/2020 4:51:14 PM By: [name] Entered By: [name] on 08/01/2020 11:16:05 [name] ([id number]) -------------------------------------------------------------------------------- Compression Therapy Details Patient Name: Gary Merlin A. Date of Service: 08/01/2020 11:15 AM Medical Record Number: [id number] Patient Account Number: [id number] Date of Birth/Sex: July 23, 1945 (75 y.o. M) Treating Bishop: [name] Primary Care Isley Zinni: [name] Other Clinician: Jeanine Bishop Referring Kayveon Lennartz: [name] Treating Yoselyn Mcglade/Extender: [name]: 6 Compression Therapy Performed for Wound Assessment: Wound #1 Left,Posterior Lower Leg Performed By: Clinician Gary Barman, Bishop Compression Type: Three Layer Pre Bishop ABI: 1.3 Post Procedure Diagnosis Same as Pre-procedure Electronic Signature(s) Signed: 08/01/2020 1:17:06 PM By: [name], [name], Bishop, [name], [name], [name] Entered By: [name], [name], Bishop, [name], Gary on 08/01/2020 11:38:46 [name]  ([id number]) -------------------------------------------------------------------------------- Encounter Discharge Information Details Patient Name: [name], Gary A. Date of Service: 08/01/2020 11:15 AM Medical Record Number: [id number] Patient Account Number: [id number] Date of Birth/Sex: 06-Jul-1945 (75 y.o. M) Treating Bishop: [name] Primary Care [name]: [name] Other Clinician: Jeanine Bishop Referring Ardell Makarewicz: [name] Treating [name]/Extender: [name]: 6 Encounter Discharge Information Items Discharge Condition: Stable Ambulatory Status: Ambulatory Discharge Destination: Home Transportation: Private Auto Accompanied By: self Schedule Follow-up Appointment: Yes Clinical Summary of Care: Electronic Signature(s) Signed: 08/01/2020 11:55:23 AM By: [name], [name] Entered By: [name], Gary Breeding on 08/01/2020 11:47:33 [name] ([id number]) -------------------------------------------------------------------------------- Lower Extremity Assessment Details Patient Name: [name], Gary A. Date of Service: 08/01/2020 11:15 AM Medical Record Number: [id number] Patient Account Number: [id number] Date of Birth/Sex: [birth year] (75 y.o. M) Treating Bishop: [name] Primary Care [name]: [name] Other Clinician: Jeanine Bishop Referring [name]: [name] Treating [name]/Extender: [name]: 6 Edema Assessment Assessed: [Left: No] [Right: No] [Left: Edema] [Right: :] Calf Left: Right: Point of Measurement: 31 cm From Medial Instep 34.2 cm Ankle Left: Right: Point of Measurement: 10 cm From Medial Instep 21.8 cm Vascular Assessment Pulses: Dorsalis Pedis Palpable: [Left:Yes] Electronic Signature(s) Signed: 08/01/2020 1:17:06 PM By: [name], [name], Bishop, [name], [name], [name] Signed: 08/01/2020 4:51:14 PM By: [name] Entered By: [name] on 08/01/2020 11:31:07 [name]  ([id number]) -------------------------------------------------------------------------------- Multi Wound Chart Details Patient Name: Gary Merlin A. Date of Service: 08/01/2020 11:15 AM Medical Record Number: [id number] Patient Account Number: [id number] Date of Birth/Sex: 09-28-1945 (75 y.o. M) Treating Bishop: [name] Primary Care [name]: [name] Other Clinician: Jeanine Bishop Referring [name]: [name] Treating [name]/Extender: [name]: 6 Vital Signs Height(in): 75 Pulse(bpm): 7 Weight(lbs): 239 Blood Pressure(mmHg): 151/89 Body Mass Index(BMI): 30 Temperature(F): 97.6 Respiratory Rate(breaths/min): 18 Photos: [N/A:N/A] Wound Location: Left, Posterior Lower Leg N/A N/A Wounding Event: Skin Tear/Laceration N/A N/A Primary Etiology: Trauma, Other N/A N/A Comorbid  History: Cataracts, Coronary Artery Disease, N/A N/A Hypertension, Gout, Received Chemotherapy Date Acquired: 06/07/2020 N/A N/A Weeks of Bishop: 6 N/A N/A Wound Status: Open N/A N/A Measurements L x W x D (cm) 0.5x0.5x0.1 N/A N/A Area (cm) : 0.196 N/A N/A Volume (cm) : 0.02 N/A N/A % Reduction in Area: 92.70% N/A N/A % Reduction in Volume: 92.50% N/A N/A Classification: Full Thickness Without Exposed N/A N/A Support Structures Exudate Amount: Medium N/A N/A Exudate Type: Serous N/A N/A Exudate Color: amber N/A N/A Granulation Amount: Large (67-100%) N/A N/A Granulation Quality: Red N/A N/A Necrotic Amount: None Present (0%) N/A N/A Exposed Structures: Fat Layer (Subcutaneous Tissue): N/A N/A Yes Fascia: No Tendon: No Muscle: No Joint: No Bone: No Epithelialization: Medium (34-66%) N/A N/A Procedures Performed: Compression Therapy N/A N/A Bishop Notes Wound #1 (Lower Leg) Wound Laterality: Left, Posterior Cleanser Peri-Wound Care Desitin Maximum Strength Ointment 4 (oz) Topical [name], [name] ([id number]) Primary Dressing Secondary  Dressing ABD Pad 5x9 (in/in) Discharge Instruction: Cover with ABD pad Hydrofera Blue Ready Transfer Foam, 2.5x2.5 (in/in) Discharge Instruction: Apply to wound bed over non-stick dressing. Secured With Compression Wrap Profore Lite LF 3 Multilayer Compression Bandaging System Discharge Instruction: Apply 3 multi-layer wrap as prescribed. Compression Stockings Add-Ons Electronic Signature(s) Signed: 08/02/2020 8:16:57 AM By: Linton Ham MD Entered By: Linton Ham on 08/01/2020 12:27:14 [name] ([id number]) -------------------------------------------------------------------------------- [place] Details Patient Name: [name], Gary A. Date of Service: 08/01/2020 11:15 AM Medical Record Number: [id number] Patient Account Number: [id number] Date of Birth/Sex: [birth year] (75 y.o. M) Treating Bishop: [name] Primary Care Eastyn Skalla: [name] Other Clinician: Jeanine Bishop Referring Alekhya Gravlin: [name] Treating Eisley Barber/Extender: [name]: 6 Active Inactive Wound/Skin Impairment Nursing Diagnoses: Impaired tissue integrity Goals: Patient/caregiver will verbalize understanding of skin care regimen Date Initiated: 06/20/2020 Target Resolution Date: 06/20/2020 Goal Status: Active Ulcer/skin breakdown will have a volume reduction of 30% by week 4 Date Initiated: 06/20/2020 Target Resolution Date: 07/18/2020 Goal Status: Active Ulcer/skin breakdown will have a volume reduction of 50% by week 8 Date Initiated: 06/20/2020 Target Resolution Date: 08/18/2020 Goal Status: Active Ulcer/skin breakdown will have a volume reduction of 80% by week 12 Date Initiated: 06/20/2020 Target Resolution Date: 09/17/2020 Goal Status: Active Interventions: Assess patient/caregiver ability to obtain necessary supplies Assess patient/caregiver ability to perform ulcer/skin care regimen upon admission and as needed Assess ulceration(s) every  visit Provide education on ulcer and skin care Bishop Activities: Skin care regimen initiated : 06/20/2020 Topical wound management initiated : 06/20/2020 Notes: Electronic Signature(s) Signed: 08/01/2020 1:17:06 PM By: [name], [name], Bishop, [name], [name], [name] Entered By: [name], [name], Bishop, [name], Gary on 08/01/2020 11:38:16 [name] ([id number]) -------------------------------------------------------------------------------- Pain Assessment Details Patient Name: Gary Merlin A. Date of Service: 08/01/2020 11:15 AM Medical Record Number: [id number] Patient Account Number: [id number] Date of Birth/Sex: 1946/03/05 (75 y.o. M) Treating Bishop: [name] Primary Care Layney Gillson: [name] Other Clinician: Jeanine Bishop Referring Shelitha Magley: [name] Treating Javionna Leder/Extender: [name]: 6 Active Problems Location of Pain Severity and Description of Pain Patient Has Paino No Site Locations Rate the pain. Current Pain Level: 0 Pain Management and Medication Current Pain Management: Electronic Signature(s) Signed: 08/01/2020 1:17:06 PM By: [name], [name], Bishop, [name], [name], [name] Signed: 08/01/2020 4:51:14 PM By: [name] Entered By: [name] on 08/01/2020 11:18:09 [name] ([id number]) -------------------------------------------------------------------------------- Patient/Caregiver Education Details Patient Name: [name], Gary A. Date of Service: 08/01/2020 11:15 AM Medical Record Number: [id number] Patient Account  Number: [id number] Date of Birth/Gender: November 03, 1945 (75 y.o. M) Treating Bishop: [name] Primary Care Physician: [name] Other Clinician: Jeanine Bishop Referring Physician: Theodoro Bishop Treating Physician/Extender: [name]: 6 Education Assessment Education Provided To: Patient Education Topics Provided Venous: Handouts: Controlling Swelling with Multilayered Compression Wraps Methods:  Demonstration, Explain/Verbal Responses: State content correctly Electronic Signature(s) Signed: 08/01/2020 1:17:06 PM By: [name], [name], Bishop, [name], [name], [name] Entered By: [name], [name], Bishop, [name], Gary on 08/01/2020 11:40:38 [name] ([id number]) -------------------------------------------------------------------------------- Wound Assessment Details Patient Name: [name], Gary A. Date of Service: 08/01/2020 11:15 AM Medical Record Number: [id number] Patient Account Number: [id number] Date of Birth/Sex: 1945/06/25 (75 y.o. M) Treating Bishop: [name] Primary Care Oma Alpert: [name] Other Clinician: Jeanine Bishop Referring Alysiah Suppa: Theodoro Bishop Treating Jnaya Butrick/Extender: [name]: 6 Wound Status Wound Number: 1 Primary Trauma, Other Etiology: Wound Location: Left, Posterior Lower Leg Wound Status: Open Wounding Event: Skin Tear/Laceration Comorbid Cataracts, Coronary Artery Disease, Hypertension, Date Acquired: 06/07/2020 History: Gout, Received Chemotherapy Weeks Of Bishop: 6 Clustered Wound: No Photos Wound Measurements Length: (cm) 0.5 Width: (cm) 0.5 Depth: (cm) 0.1 Area: (cm) 0.196 Volume: (cm) 0.02 % Reduction in Area: 92.7% % Reduction in Volume: 92.5% Epithelialization: Medium (34-66%) Tunneling: No Undermining: No Wound Description Classification: Full Thickness Without Exposed Support Structu Exudate Amount: Medium Exudate Type: Serous Exudate Color: amber res Foul Odor After Cleansing: No Slough/Fibrino No Wound Bed Granulation Amount: Large (67-100%) Exposed Structure Granulation Quality: Red Fascia Exposed: No Necrotic Amount: None Present (0%) Fat Layer (Subcutaneous Tissue) Exposed: Yes Tendon Exposed: No Muscle Exposed: No Joint Exposed: No Bone Exposed: No Bishop Notes Wound #1 (Lower Leg) Wound Laterality: Left, Posterior Cleanser Peri-Wound Care Desitin Maximum Strength Ointment 4 (oz) Topical Bishop, Gary A. ([id number]) Primary Dressing Secondary Dressing ABD Pad 5x9 (in/in) Discharge Instruction: Cover with ABD pad Hydrofera Blue Ready Transfer Foam, 2.5x2.5 (in/in) Discharge Instruction: Apply to wound bed over non-stick dressing. Secured With Compression Wrap Profore Lite LF 3 Multilayer Compression Bandaging System Discharge Instruction: Apply 3 multi-layer wrap as prescribed. Compression Stockings Add-Ons Electronic Signature(s) Signed: 08/01/2020 1:17:06 PM By: [name], [name], Bishop, [name], [name], [name] Signed: 08/01/2020 4:51:14 PM By: [name] Entered By: [name] on 08/01/2020 11:29:57 [name] ([id number]) -------------------------------------------------------------------------------- Vitals Details Patient Name: [name], Gary A. Date of Service: 08/01/2020 11:15 AM Medical Record Number: [id number] Patient Account Number: [id number] Date of Birth/Sex: Jan 28, 1946 (75 y.o. M) Treating Bishop: [name] Primary Care Jah Alarid: [name] Other Clinician: Jeanine Bishop Referring Morio Widen: [name] Treating Pilot Prindle/Extender: [name]: 6 Vital Signs Time Taken: 11:15 Temperature (F): 97.6 Height (in): 75 Pulse (bpm): 86 Weight (lbs): 239 Respiratory Rate (breaths/min): 18 Body Mass Index (BMI): 29.9 Blood Pressure (mmHg): 151/89 Reference Range: 80 - 120 mg / dl Electronic Signature(s) Signed: 08/01/2020 4:51:14 PM By: [name] Entered By: [name] on 08/01/2020 11:18:01

## 2020-08-02 NOTE — Progress Notes (Signed)
REMON, QUINTO ([id number]) Visit Report for 08/01/2020 HPI Details Patient Name: [name], [name]. Date of Service: 08/01/2020 11:15 AM Medical Record Number: [id number] Patient Account Number: [id number] Date of Birth/Sex: Sep 28, 1945 (75 y.o. 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 He is on his feet for long hours. He tells [place] that in early February he scratched himself with his fingernails on his posterior calf unbeknownst to him causing a wound. He was admitted to hospital from 06/11/2020 through 06/13/2020 with left leg cellulitis. Blood cultures were negative and ultrasound was negative for DVT and no abscess was found. He has completed antibiotics. He is using a Neosporin and a Band-Aid over this area. Past medical history includes type 2 diabetes, sick sinus syndrome, hypertension, coronary artery disease, pacemaker. He is not on oral anticoagulants ABI in our clinic was 1.26 on the left 2/23; patient who presented to clinic last week having a minor trauma to his leg however he required admitting to the hospital for treatment of left leg cellulitis. Small wound on the left posterior calf however completely necrotic last week requiring an aggressive debridement. We put him in 3 layer compression with Iodoflex. He tolerated this well 3/2 patient continues to have a small wound on the left posterior calf however still with a mostly necrotic surface. Still requires debridement we have been using Iodoflex under compression. Original wound was minor trauma complicated by cellulitis earlier this month. 07/11/2020 upon evaluation today patient appears to be doing well with regard to his wound  on the posterior leg. We have been using Iodoflex which I do feel like it is helpful for him. Is helping to clean up the wound bed. With that being said there is little bit of irritation around the edges of the wound I think the possible using a little Desitin here will help protect that region. Overall I think the cellulitis has improved and that is doing quite well. My main issue that I see is just that of slough buildup in the middle portion of the wound. 3/16; left posterior lower leg. Much better wound surface with a rim of surrounding epithelialization. We have been using Iodoflex but I felt we could change him to [facility] today 3/23; left posterior lower leg continued improvement in surface area. Nice rim of surrounding epithelialization. Even under illumination the wound looks healthy 3/30; left posterior lower leg now down to 2 tiny open areas. Quite an improvement in surface area. No need for debridement should be healed by next Electronic Signature(s) Signed: 08/02/2020 8:16:57 AM By: Linton Ham MD Entered By: Linton Ham on 08/01/2020 12:28:16 [name] ([id number]) -------------------------------------------------------------------------------- Physical Exam Details Patient Name: [name], Gary A. Date of Service: 08/01/2020 11:15 AM Medical Record Number: [id number] Patient Account Number: [id number] Date of Birth/Sex: Mar 20, 1946 (75 y.o. M) Treating RN: [name] Primary Care Provider: [name] Other Clinician: [name] Referring Provider: [name] Treating Provider/Extender: [name]: 6 Constitutional Patient is hypertensive.. Pulse regular and within target range for patient.Marland Kitchen Respirations regular, non-labored and within target range.. Temperature is normal and within the target range for the patient.Marland Kitchen appears in no distress. Cardiovascular Needle pulses are palpable. We have excellent edema  control. Notes Wound exam; area  on the posterior left calf. This is just about closed only 2 small open areas remain of the original quarter sized. There is no evidence of surrounding infection Electronic Signature(s) Signed: 08/02/2020 8:16:57 AM By: Linton Ham MD Entered By: Linton Ham on 08/01/2020 12:29:15 [name] ([id number]) -------------------------------------------------------------------------------- Physician Orders Details Patient Name: Gary Merlin A. Date of Service: 08/01/2020 11:15 AM Medical Record Number: [id number] Patient Account Number: [id number] Date of Birth/Sex: 1945-12-23 (75 y.o. M) Treating RN: [name] Primary Care Provider: [name] Other Clinician: [name] Referring Provider: [name] Treating Provider/Extender: [name]: 6 Verbal / Phone Orders: No Diagnosis Coding Follow-up Appointments o Return Appointment in 1 week. o Nurse Visit as needed [employer] o Wash wounds with antibacterial soap and water. o May shower with wound dressing protected with water repellent cover or cast protector. Anesthetic (Use 'Patient Medications' Section for Anesthetic Order Entry) o Lidocaine applied to wound bed Edema Control - Lymphedema / Segmental Compressive Device / Other Left Lower Extremity o Optional: One layer of unna paste to top of compression wrap (to act as an anchor). o Elevate, Exercise Daily and Avoid Standing for Long Periods of Time. o Elevate legs to the level of the heart and pump ankles as often as possible o Elevate leg(s) parallel to the floor when sitting. Wound Treatment Wound #1 - Lower Leg Wound Laterality: Left, Posterior Peri-Wound Care: Desitin Maximum Strength Ointment 4 (oz) Secondary Dressing: ABD Pad 5x9 (in/in) Discharge Instructions: Cover with ABD pad Secondary Dressing: Hydrofera Blue Ready Transfer Foam, 2.5x2.5 (in/in) Discharge  Instructions: Apply to wound bed over non-stick dressing. Compression Wrap: Profore Lite LF 3 Multilayer Compression Bandaging System Discharge Instructions: Apply 3 multi-layer wrap as prescribed. Electronic Signature(s) Signed: 08/01/2020 1:17:06 PM By: [name], [name], RN, [name], [name] RN, [name] Signed: 08/02/2020 8:16:57 AM By: Linton Ham MD Entered By: [name], [name], RN, [name], [name] on 08/01/2020 11:39:39 [name], [name] ([id number]) -------------------------------------------------------------------------------- Problem List Details Patient Name: [name], [name]. Date of Service: 08/01/2020 11:15 AM Medical Record Number: [id number] Patient Account Number: [id number] Date of Birth/Sex: [birth year] (75 y.o. M) Treating RN: [name] Primary Care Provider: [name] Other Clinician: [name] Referring Provider: [name] Treating Provider/Extender: [name]: 6 Active Problems ICD-10 Encounter Code Description Active Date MDM Diagnosis S80.812D Abrasion, left lower leg, subsequent encounter 06/20/2020 No Yes L97.228 Non-pressure chronic ulcer of left calf with other specified severity 06/20/2020 No Yes I87.312 Chronic venous hypertension (idiopathic) with ulcer of left lower 06/20/2020 No Yes extremity Inactive Problems Resolved Problems Electronic Signature(s) Signed: 08/02/2020 8:16:57 AM By: Linton Ham MD Entered By: Linton Ham on 08/01/2020 12:27:06 [name] ([id number]) -------------------------------------------------------------------------------- Progress Note Details Patient Name: Gary Merlin A. Date of Service: 08/01/2020 11:15 AM Medical Record Number: [id number] Patient Account Number: [id number] Date of Birth/Sex: March 22, 1946 (75 y.o. 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 He is on his feet for long hours. He tells [place] that in early February he scratched himself with his fingernails on his posterior calf unbeknownst to him causing a wound. He was admitted to hospital from 06/11/2020 through 06/13/2020 with left leg cellulitis. Blood cultures were negative and ultrasound was negative for  DVT and no abscess was found. He has completed antibiotics. He is using a Neosporin and a Band-Aid over this area. Past medical history includes type 2 diabetes, sick sinus syndrome, hypertension, coronary artery disease, pacemaker. He is not on oral anticoagulants ABI in our clinic was 1.26 on the left 2/23; patient who presented to clinic last week having a minor trauma to his leg however he required admitting to the hospital for treatment of left leg cellulitis. Small wound on the left posterior calf however completely necrotic last week requiring an aggressive debridement. We put him in 3 layer compression with Iodoflex. He tolerated this well 3/2 patient continues to have a small wound on the left posterior calf however still with a mostly necrotic surface. Still requires debridement we have been using Iodoflex under compression. Original wound was minor trauma complicated by cellulitis earlier this month. 07/11/2020 upon evaluation today patient appears to be doing well with regard to his wound on the posterior leg. We have been using Iodoflex which I do feel like it is helpful for him. Is helping to clean up the wound bed. With that being said there is little bit of irritation around the edges of the wound I think the possible using a little Desitin here will help protect that region. Overall I think the cellulitis has improved and that is doing quite well. My main issue that I see is just that of slough buildup in the  middle portion of the wound. 3/16; left posterior lower leg. Much better wound surface with a rim of surrounding epithelialization. We have been using Iodoflex but I felt we could change him to [facility] today 3/23; left posterior lower leg continued improvement in surface area. Nice rim of surrounding epithelialization. Even under illumination the wound looks healthy 3/30; left posterior lower leg now down to 2 tiny open areas. Quite an improvement in surface area. No need for debridement should be healed by next Objective Constitutional Patient is hypertensive.. Pulse regular and within target range for patient.Marland Kitchen Respirations regular, non-labored and within target range.. Temperature is normal and within the target range for the patient.Marland Kitchen appears in no distress. Vitals Time Taken: 11:15 AM, Height: 75 in, Weight: 239 lbs, BMI: 29.9, Temperature: 97.6 F, Pulse: 86 bpm, Respiratory Rate: 18 breaths/min, Blood Pressure: 151/89 mmHg. Cardiovascular Needle pulses are palpable. We have excellent edema control. General Notes: Wound exam; area on the posterior left calf. This is just about closed only 2 small open areas remain of the original quarter sized. There is no evidence of surrounding infection Integumentary (Hair, Skin) Wound #1 status is Open. Original cause of wound was Skin Tear/Laceration. The date acquired was: 06/07/2020. The wound has been in treatment 6 weeks. The wound is located on the Left,Posterior Lower Leg. The wound measures 0.5cm length x 0.5cm width x 0.1cm depth; 0.196cm^2 area and 0.02cm^3 volume. There is Fat Layer (Subcutaneous Tissue) exposed. There is no tunneling or undermining noted. There is a medium amount of serous drainage noted. There is large (67-100%) red granulation within the wound bed. There is no necrotic tissue within the wound bed. [name], [name] ([id number]) Assessment Active Problems ICD-10 Abrasion, left lower leg, subsequent  encounter Non-pressure chronic ulcer of left calf with other specified severity Chronic venous hypertension (idiopathic) with ulcer of left lower extremity Procedures Wound #1 Pre-procedure diagnosis of Wound #1 is a Trauma, Other located on the Left,Posterior Lower Leg . There was a Three Layer Compression Therapy Procedure with a pre-treatment ABI  of 1.3 by [name], RN. Post procedure Diagnosis Wound #1: Same as Pre-Procedure Plan Follow-up Appointments: Return Appointment in 1 week. Nurse Visit as needed Bathing/ Shower/ Hygiene: Wash wounds with antibacterial soap and water. May shower with wound dressing protected with water repellent cover or cast protector. Anesthetic (Use 'Patient Medications' Section for Anesthetic Order Entry): Lidocaine applied to wound bed Edema Control - Lymphedema / Segmental Compressive Device / Other: Optional: One layer of unna paste to top of compression wrap (to act as an anchor). Elevate, Exercise Daily and Avoid Standing for Long Periods of Time. Elevate legs to the level of the heart and pump ankles as often as possible Elevate leg(s) parallel to the floor when sitting. WOUND #1: - Lower Leg Wound Laterality: Left, Posterior Peri-Wound Care: Desitin Maximum Strength Ointment 4 (oz) Secondary Dressing: ABD Pad 5x9 (in/in) Discharge Instructions: Cover with ABD pad Secondary Dressing: Hydrofera Blue Ready Transfer Foam, 2.5x2.5 (in/in) Discharge Instructions: Apply to wound bed over non-stick dressing. Compression Wrap: Profore Lite LF 3 Multilayer Compression Bandaging System Discharge Instructions: Apply 3 multi-layer wrap as prescribed. 1. Continue with Hydrofera Blue ABD 3 layer compression 2. Should be closed by next week 3. This initially was a traumatic wound with complicating infection. The patient does work on his feet for long hours although I do not know that I see a lot of evidence of severe chronic venous insufficiency Electronic  Signature(s) Signed: 08/02/2020 8:16:57 AM By: Linton Ham MD Entered By: Linton Ham on 08/01/2020 12:30:27 [name] ([id number]) -------------------------------------------------------------------------------- [place] Details Patient Name: Gary Merlin A. Date of Service: 08/01/2020 Medical Record Number: [id number] Patient Account Number: [id number] Date of Birth/Sex: [birth year] (75 y.o. M) Treating RN: [name] Primary Care Provider: [name] Other Clinician: [name] Referring Provider: [name] Treating Provider/Extender: [name]: 6 Diagnosis Coding ICD-10 Codes Code Description [phone] Abrasion, left lower leg, subsequent encounter L97.228 Non-pressure chronic ulcer of left calf with other specified severity I87.312 Chronic venous hypertension (idiopathic) with ulcer of left lower extremity Facility Procedures CPT4 Code: [id number] Description: (Facility Use Only) [phone] - [place] LWR LT LEG Modifier: Quantity: 1 Physician Procedures CPT4 Code: [id number] Description: 11941 - WC PHYS LEVEL 3 - EST PT Modifier: Quantity: 1 CPT4 Code: Description: ICD-10 Diagnosis Description S80.812D Abrasion, left lower leg, subsequent encounter L97.228 Non-pressure chronic ulcer of left calf with other specified severity I87.312 Chronic venous hypertension (idiopathic) with ulcer of left lower extr Modifier: emity Quantity: Electronic Signature(s) Signed: 08/02/2020 8:16:57 AM By: Linton Ham MD Entered By: Linton Ham on 08/01/2020 12:30:46

## 2020-08-08 ENCOUNTER — Encounter: Payer: Medicare HMO | Attending: Internal Medicine | Admitting: Internal Medicine

## 2020-08-08 ENCOUNTER — Other Ambulatory Visit: Payer: Self-pay

## 2020-08-08 DIAGNOSIS — X58XXXA Exposure to other specified factors, initial encounter: Secondary | ICD-10-CM | POA: Insufficient documentation

## 2020-08-08 DIAGNOSIS — L97222 Non-pressure chronic ulcer of left calf with fat layer exposed: Secondary | ICD-10-CM | POA: Diagnosis not present

## 2020-08-08 DIAGNOSIS — I10 Essential (primary) hypertension: Secondary | ICD-10-CM | POA: Diagnosis not present

## 2020-08-08 DIAGNOSIS — L97228 Non-pressure chronic ulcer of left calf with other specified severity: Secondary | ICD-10-CM | POA: Diagnosis not present

## 2020-08-08 DIAGNOSIS — L03116 Cellulitis of left lower limb: Secondary | ICD-10-CM | POA: Diagnosis not present

## 2020-08-08 DIAGNOSIS — S80812A Abrasion, left lower leg, initial encounter: Secondary | ICD-10-CM | POA: Diagnosis not present

## 2020-08-08 DIAGNOSIS — E11622 Type 2 diabetes mellitus with other skin ulcer: Secondary | ICD-10-CM | POA: Insufficient documentation

## 2020-08-08 DIAGNOSIS — Z950 Presence of cardiac pacemaker: Secondary | ICD-10-CM | POA: Diagnosis not present

## 2020-08-09 NOTE — Progress Notes (Signed)
JAHMEIR, GEISEN ([id number]) Visit Report for 08/08/2020 Arrival Information Details Patient Name: [name], [name]. Date of Service: 08/08/2020 3:00 PM Medical Record Number: [id number] Patient Account Number: [id number] Date of Birth/Sex: June 27, 1945 (75 y.o. M) Treating RN: [name] Primary Care Anesa Fronek: [name] Other Clinician: [name] Referring Rashawnda Gaba: [name] Treating Nazar Kuan/Extender: [name]: 7 Visit Information History Since Last Visit Added or deleted any medications: No Patient Arrived: Ambulatory Had a fall or experienced change in No Arrival Time: 15:12 activities of daily living that may affect Accompanied By: self risk of falls: Transfer Assistance: None Hospitalized since last visit: No Patient Identification Verified: Yes Pain Present Now: No Secondary Verification Process Completed: Yes Electronic Signature(s) Signed: 08/08/2020 4:53:25 PM By: [name] Entered By: [name] on 08/08/2020 15:12:24 [name] ([id number]) -------------------------------------------------------------------------------- Clinic Level of Care Assessment Details Patient Name: [name]. Date of Service: 08/08/2020 3:00 PM Medical Record Number: [id number] Patient Account Number: [id number] Date of Birth/Sex: 1946/02/08 (75 y.o. M) Treating RN: [name] Primary Care Fredick Schlosser: [name] Other Clinician: [name] Referring Shelaine Frie: [name] Treating Shalonda Sachse/Extender: [name]: 7 Clinic Level of Care Assessment Items TOOL 1 Quantity Score []  - Use when EandM and Procedure is performed on INITIAL visit 0 ASSESSMENTS - Nursing Assessment / Reassessment []  - General Physical Exam (combine w/ comprehensive assessment (listed just below) when performed on new 0 pt. evals) []  - 0 Comprehensive Assessment (HX, ROS, Risk Assessments, Wounds Hx, etc.) ASSESSMENTS - Wound and  Skin Assessment / Reassessment []  - Dermatologic / Skin Assessment (not related to wound area) 0 ASSESSMENTS - Ostomy and/or Continence Assessment and Care []  - Incontinence Assessment and Management 0 []  - 0 Ostomy Care Assessment and Management (repouching, etc.) PROCESS - Coordination of Care []  - Simple Patient / Family Education for ongoing care 0 []  - 0 Complex (extensive) Patient / Family Education for ongoing care []  - 0 Staff obtains Programmer, systems, Records, Test Results / Process Orders []  - 0 Staff telephones HHA, Nursing Homes / Clarify orders / etc []  - 0 Routine Transfer to another Facility (non-emergent condition) []  - 0 Routine Hospital Admission (non-emergent condition) []  - 0 New Admissions / Biomedical engineer / Ordering NPWT, Apligraf, etc. []  - 0 Emergency Hospital Admission (emergent condition) PROCESS - Special Needs []  - Pediatric / Minor Patient Management 0 []  - 0 Isolation Patient Management []  - 0 Hearing / Language / Visual special needs []  - 0 Assessment of Community assistance (transportation, D/C planning, etc.) []  - 0 Additional assistance / Altered mentation []  - 0 Support Surface(s) Assessment (bed, cushion, seat, etc.) INTERVENTIONS - Miscellaneous []  - External ear exam 0 []  - 0 Patient Transfer (multiple staff / Civil Service fast streamer / Similar devices) []  - 0 Simple Staple / Suture removal (25 or less) []  - 0 Complex Staple / Suture removal (26 or more) []  - 0 Hypo/Hyperglycemic Management (do not check if billed separately) []  - 0 Ankle / Brachial Index (ABI) - do not check if billed separately Has the patient been seen at the hospital within the last three years: Yes Total Score: 0 Level Of Care: ____ [name] ([id number]) Electronic Signature(s) Signed: 08/08/2020 5:25:07 PM By: [name], [name], RN, [name], [name] RN, [name] Entered By: [name], [name], RN, [name], [name] on 08/08/2020 15:49:34 [name]  ([id number]) -------------------------------------------------------------------------------- Compression Therapy Details Patient Name: Gary Merlin A. Date of Service: 08/08/2020 3:00 PM Medical Record Number: [id number]  Patient Account Number: [id number] Date of Birth/Sex: 1945/12/09 (75 y.o. M) Treating RN: [name] Primary Care Charles Andringa: [name] Other Clinician: [name] Referring Pegah Segel: [name] Treating Adam Sanjuan/Extender: [name]: 7 Compression Therapy Performed for Wound Assessment: Wound #1 Left,Posterior Lower Leg Performed By: Clinician [name], RN Compression Type: Three Layer Pre Treatment ABI: 1.3 Post Procedure Diagnosis Same as Pre-procedure Electronic Signature(s) Signed: 08/08/2020 5:25:07 PM By: [name], [name], RN, [name], [name] RN, [name] Entered By: [name], [name], RN, [name], [name] on 08/08/2020 15:48:45 [name] ([id number]) -------------------------------------------------------------------------------- Encounter Discharge Information Details Patient Name: [name], Gary A. Date of Service: 08/08/2020 3:00 PM Medical Record Number: [id number] Patient Account Number: [id number] Date of Birth/Sex: March 26, 1946 (75 y.o. M) Treating RN: [name] Primary Care Raymona Boss: [name] Other Clinician: [name] Referring Ladeja Pelham: [name] Treating Blythe Hartshorn/Extender: [name]: 7 Encounter Discharge Information Items Discharge Condition: Stable Ambulatory Status: Ambulatory Discharge Destination: Home Transportation: Private Auto Accompanied By: self Schedule Follow-up Appointment: Yes Clinical Summary of Care: Electronic Signature(s) Signed: 08/08/2020 5:25:07 PM By: [name], [name], RN, [name], [name] RN, [name] Entered By: [name], [name], RN, [name], [name] on 08/08/2020 16:00:50 [name] ([id number]) -------------------------------------------------------------------------------- Lower Extremity  Assessment Details Patient Name: [name], Gary A. Date of Service: 08/08/2020 3:00 PM Medical Record Number: [id number] Patient Account Number: [id number] Date of Birth/Sex: 02-01-1946 (75 y.o. M) Treating RN: [name] Primary Care Adell Panek: [name] Other Clinician: [name] Referring Dyamond Tolosa: [name] Treating Irfan Veal/Extender: [name]: 7 Edema Assessment Assessed: [Left: No] [Right: No] [Left: Edema] [Right: :] Calf Left: Right: Point of Measurement: 31 cm From Medial Instep 36 cm Ankle Left: Right: Point of Measurement: 10 cm From Medial Instep 22 cm Vascular Assessment Pulses: Dorsalis Pedis Palpable: [Left:Yes] Electronic Signature(s) Signed: 08/08/2020 4:53:25 PM By: [name] Signed: 08/08/2020 5:25:07 PM By: [name], [name], RN, [name], [name] RN, [name] Entered By: [name] on 08/08/2020 15:24:54 [name] ([id number]) -------------------------------------------------------------------------------- Multi Wound Chart Details Patient Name: [name], Gary A. Date of Service: 08/08/2020 3:00 PM Medical Record Number: [id number] Patient Account Number: [id number] Date of Birth/Sex: 05-22-1945 (75 y.o. M) Treating RN: [name] Primary Care Kindra Bickham: [name] Other Clinician: [name] Referring Candiss Galeana: [name] Treating Jashawna Reever/Extender: [name]: 7 Vital Signs Height(in): 75 Pulse(bpm): 78 Weight(lbs): 239 Blood Pressure(mmHg): 134/83 Body Mass Index(BMI): 30 Temperature(F): 97.7 Respiratory Rate(breaths/min): 18 Photos: [N/A:N/A] Wound Location: Left, Posterior Lower Leg N/A N/A Wounding Event: Skin Tear/Laceration N/A N/A Primary Etiology: Trauma, Other N/A N/A Comorbid History: Cataracts, Coronary Artery Disease, N/A N/A Hypertension, Gout, Received Chemotherapy Date Acquired: 06/07/2020 N/A N/A Weeks of Treatment: 7 N/A N/A Wound Status: Open N/A  N/A Measurements L x W x D (cm) 0.3x0.2x0.1 N/A N/A Area (cm) : 0.047 N/A N/A Volume (cm) : 0.005 N/A N/A % Reduction in Area: 98.20% N/A N/A % Reduction in Volume: 98.10% N/A N/A Classification: Full Thickness Without Exposed N/A N/A Support Structures Exudate Amount: Small N/A N/A Exudate Type: Serous N/A N/A Exudate Color: amber N/A N/A Granulation Amount: Large (67-100%) N/A N/A Granulation Quality: Red N/A N/A Necrotic Amount: None Present (0%) N/A N/A Exposed Structures: Fat Layer (Subcutaneous Tissue): N/A N/A Yes Fascia: No Tendon: No Muscle: No Joint: No Bone: No Epithelialization: Medium (34-66%) N/A N/A Procedures Performed: Compression Therapy N/A N/A Treatment Notes Wound #1 (Lower Leg) Wound Laterality: Left, Posterior Cleanser Peri-Wound Care Desitin Maximum Strength Ointment 4 (oz) Topical Baggett, Brack  A. ([id number]) Primary Dressing Secondary Dressing ABD Pad 5x9 (in/in) Discharge Instruction: Cover with ABD pad Hydrofera Blue Ready Transfer Foam, 2.5x2.5 (in/in) Discharge Instruction: Apply to wound bed over non-stick dressing. Secured With Compression Wrap Profore Lite LF 3 Multilayer Compression Bandaging System Discharge Instruction: Apply 3 multi-layer wrap as prescribed. Compression Stockings Add-Ons Electronic Signature(s) Signed: 08/09/2020 7:59:07 AM By: Linton Ham MD Entered By: Linton Ham on 08/08/2020 16:28:15 [name] ([id number]) -------------------------------------------------------------------------------- [place] Details Patient Name: [name], Gary A. Date of Service: 08/08/2020 3:00 PM Medical Record Number: [id number] Patient Account Number: [id number] Date of Birth/Sex: Aug 11, 1945 (75 y.o. M) Treating RN: [name] Primary Care Shaleah Nissley: [name] Other Clinician: [name] Referring Ikran Patman: [name] Treating Misbah Hornaday/Extender: [name]:  7 Active Inactive Wound/Skin Impairment Nursing Diagnoses: Impaired tissue integrity Goals: Patient/caregiver will verbalize understanding of skin care regimen Date Initiated: 06/20/2020 Target Resolution Date: 06/20/2020 Goal Status: Active Ulcer/skin breakdown will have a volume reduction of 30% by week 4 Date Initiated: 06/20/2020 Target Resolution Date: 07/18/2020 Goal Status: Active Ulcer/skin breakdown will have a volume reduction of 50% by week 8 Date Initiated: 06/20/2020 Target Resolution Date: 08/18/2020 Goal Status: Active Ulcer/skin breakdown will have a volume reduction of 80% by week 12 Date Initiated: 06/20/2020 Target Resolution Date: 09/17/2020 Goal Status: Active Interventions: Assess patient/caregiver ability to obtain necessary supplies Assess patient/caregiver ability to perform ulcer/skin care regimen upon admission and as needed Assess ulceration(s) every visit Provide education on ulcer and skin care Treatment Activities: Skin care regimen initiated : 06/20/2020 Topical wound management initiated : 06/20/2020 Notes: Electronic Signature(s) Signed: 08/08/2020 5:25:07 PM By: [name], [name], RN, [name], [name] RN, [name] Entered By: [name], [name], RN, [name], [name] on 08/08/2020 15:47:44 [name] ([id number]) -------------------------------------------------------------------------------- Pain Assessment Details Patient Name: Gary Merlin A. Date of Service: 08/08/2020 3:00 PM Medical Record Number: [id number] Patient Account Number: [id number] Date of Birth/Sex: [birth year] (75 y.o. M) Treating RN: [name] Primary Care Yakov Bergen: [name] Other Clinician: [name] Referring Abriana Saltos: [name] Treating Wilhemenia Camba/Extender: [name]: 7 Active Problems Location of Pain Severity and Description of Pain Patient Has Paino No Site Locations Rate the pain. Current Pain Level: 0 Pain Management and Medication Current Pain  Management: Electronic Signature(s) Signed: 08/08/2020 4:53:25 PM By: [name] Signed: 08/08/2020 5:25:07 PM By: [name], [name], RN, [name], [name] RN, [name] Entered By: [name] on 08/08/2020 15:12:55 [name] ([id number]) -------------------------------------------------------------------------------- Patient/Caregiver Education Details Patient Name: [name], Gary A. Date of Service: 08/08/2020 3:00 PM Medical Record Number: [id number] Patient Account Number: [id number] Date of Birth/Gender: 1946-02-21 (75 y.o. M) Treating RN: [name] Primary Care Physician: [name] Other Clinician: [name] Referring Physician: [name] Treating Physician/Extender: [name]: 7 Education Assessment Education Provided To: Patient Education Topics Provided Venous: Handouts: Controlling Swelling with Multilayered Compression Wraps Methods: Demonstration, Explain/Verbal Responses: State content correctly Electronic Signature(s) Signed: 08/08/2020 5:25:07 PM By: [name], [name], RN, [name], [name] RN, [name] Entered By: [name], [name], RN, [name], [name] on 08/08/2020 15:50:06 [name] ([id number]) -------------------------------------------------------------------------------- Wound Assessment Details Patient Name: [name], Gary A. Date of Service: 08/08/2020 3:00 PM Medical Record Number: [id number] Patient Account Number: [id number] Date of Birth/Sex: May 13, 1945 (75 y.o. M) Treating RN: [name] Primary Care Jaiveon Suppes: [name] Other Clinician: [name] Referring Aldea Avis: [name] Treating Spyros Winch/Extender: [name]: 7 Wound Status Wound Number: 1 Primary Trauma, Other Etiology: Wound Location: Left, Posterior Lower  Leg Wound Status: Open Wounding Event: Skin Tear/Laceration Comorbid Cataracts, Coronary Artery Disease, Hypertension, Date Acquired: 06/07/2020 History: Gout, Received Chemotherapy Weeks Of  Treatment: 7 Clustered Wound: No Photos Wound Measurements Length: (cm) 0.3 Width: (cm) 0.2 Depth: (cm) 0.1 Area: (cm) 0.047 Volume: (cm) 0.005 % Reduction in Area: 98.2% % Reduction in Volume: 98.1% Epithelialization: Medium (34-66%) Tunneling: No Undermining: No Wound Description Classification: Full Thickness Without Exposed Support Structu Exudate Amount: Small Exudate Type: Serous Exudate Color: amber res Foul Odor After Cleansing: No Slough/Fibrino No Wound Bed Granulation Amount: Large (67-100%) Exposed Structure Granulation Quality: Red Fascia Exposed: No Necrotic Amount: None Present (0%) Fat Layer (Subcutaneous Tissue) Exposed: Yes Tendon Exposed: No Muscle Exposed: No Joint Exposed: No Bone Exposed: No Treatment Notes Wound #1 (Lower Leg) Wound Laterality: Left, Posterior Cleanser Peri-Wound Care Desitin Maximum Strength Ointment 4 (oz) Topical Rosal, Kwame A. ([id number]) Primary Dressing Secondary Dressing ABD Pad 5x9 (in/in) Discharge Instruction: Cover with ABD pad Hydrofera Blue Ready Transfer Foam, 2.5x2.5 (in/in) Discharge Instruction: Apply to wound bed over non-stick dressing. Secured With Compression Wrap Profore Lite LF 3 Multilayer Compression Bandaging System Discharge Instruction: Apply 3 multi-layer wrap as prescribed. Compression Stockings Add-Ons Electronic Signature(s) Signed: 08/08/2020 4:53:25 PM By: [name] Signed: 08/08/2020 5:25:07 PM By: [name], [name], RN, [name], [name] RN, [name] Entered By: [name] on 08/08/2020 15:22:31 [name] ([id number]) -------------------------------------------------------------------------------- Vitals Details Patient Name: [name], Gary A. Date of Service: 08/08/2020 3:00 PM Medical Record Number: [id number] Patient Account Number: [id number] Date of Birth/Sex: 1945/05/27 (75 y.o. M) Treating RN: [name] Primary Care Shahmeer Bunn: [name] Other Clinician: [name] Referring  Bianco Cange: [name] Treating Labrea Eccleston/Extender: [name]: 7 Vital Signs Time Taken: 15:12 Temperature (F): 97.7 Height (in): 75 Pulse (bpm): 78 Weight (lbs): 239 Respiratory Rate (breaths/min): 18 Body Mass Index (BMI): 29.9 Blood Pressure (mmHg): 134/83 Reference Range: 80 - 120 mg / dl Electronic Signature(s) Signed: 08/08/2020 4:53:25 PM By: [name] Entered By: [name] on 08/08/2020 15:12:45

## 2020-08-09 NOTE — Progress Notes (Signed)
JUMAR, GREENSTREET ([id number]) Visit Report for 08/08/2020 HPI Details Patient Name: [name], [name]. Date of Service: 08/08/2020 3:00 PM Medical Record Number: [id number] Patient Account Number: [id number] Date of Birth/Sex: 12-04-1945 (75 y.o. 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 He is on his feet for long hours. He tells [place] that in early February he scratched himself with his fingernails on his posterior calf unbeknownst to him causing a wound. He was admitted to hospital from 06/11/2020 through 06/13/2020 with left leg cellulitis. Blood cultures were negative and ultrasound was negative for DVT and no abscess was found. He has completed antibiotics. He is using a Neosporin and a Band-Aid over this area. Past medical history includes type 2 diabetes, sick sinus syndrome, hypertension, coronary artery disease, pacemaker. He is not on oral anticoagulants ABI in our clinic was 1.26 on the left 2/23; patient who presented to clinic last week having a minor trauma to his leg however he required admitting to the hospital for treatment of left leg cellulitis. Small wound on the left posterior calf however completely necrotic last week requiring an aggressive debridement. We put him in 3 layer compression with Iodoflex. He tolerated this well 3/2 patient continues to have a small wound on the left posterior calf however still with a mostly necrotic surface. Still requires debridement we have been using Iodoflex under compression. Original wound was minor trauma complicated by cellulitis earlier this month. 07/11/2020 upon evaluation today patient appears to be doing well with regard to his wound on  the posterior leg. We have been using Iodoflex which I do feel like it is helpful for him. Is helping to clean up the wound bed. With that being said there is little bit of irritation around the edges of the wound I think the possible using a little Desitin here will help protect that region. Overall I think the cellulitis has improved and that is doing quite well. My main issue that I see is just that of slough buildup in the middle portion of the wound. 3/16; left posterior lower leg. Much better wound surface with a rim of surrounding epithelialization. We have been using Iodoflex but I felt we could change him to [facility] today 3/23; left posterior lower leg continued improvement in surface area. Nice rim of surrounding epithelialization. Even under illumination the wound looks healthy 3/30; left posterior lower leg now down to 2 tiny open areas. Quite an improvement in surface area. No need for debridement should be healed by next 4/6; left posterior leg wound still the same 2 wounds there has been an improvement in surface area especially in the larger wound. No debridement done Electronic Signature(s) Signed: 08/09/2020 7:59:07 AM By: Linton Ham MD Entered By: Linton Ham on 08/08/2020 16:28:45 [name] ([id number]) -------------------------------------------------------------------------------- Physical Exam Details Patient Name: [name], Gary A. Date of Service: 08/08/2020 3:00 PM Medical Record Number: [id number] Patient Account Number: [id number] Date of Birth/Sex: [birth year] (75 y.o. M) Treating RN: [name] Primary Care Provider: [name] Other Clinician: [name] Referring Provider: [name] Treating Provider/Extender: [name]: 7 Constitutional Sitting or standing Blood Pressure is within target range for patient.. Pulse regular and within target range for patient.Marland Kitchen Respirations regular, non-  labored and within  target range.. Temperature is normal and within the target range for the patient.Marland Kitchen appears in no distress. Cardiovascular Edema control is good. Notes Is on the lowWound exam; posterior calf. The larger wound superiorly is almost completely closed surrounding epithelialization. oThe second 1 inferiorly is tiny but slough covered. This is very difficult to consider debridement is just too Water engineer) Signed: 08/09/2020 7:59:07 AM By: Linton Ham MD Entered By: Linton Ham on 08/08/2020 16:29:55 [name] ([id number]) -------------------------------------------------------------------------------- Physician Orders Details Patient Name: Gary Merlin A. Date of Service: 08/08/2020 3:00 PM Medical Record Number: [id number] Patient Account Number: [id number] Date of Birth/Sex: 05/16/1945 (75 y.o. M) Treating RN: [name] Primary Care Provider: [name] Other Clinician: [name] Referring Provider: [name] Treating Provider/Extender: [name]: 7 Verbal / Phone Orders: No Diagnosis Coding Follow-up Appointments o Return Appointment in 1 week. o Nurse Visit as needed [employer] o Wash wounds with antibacterial soap and water. o May shower with wound dressing protected with water repellent cover or cast protector. Anesthetic (Use 'Patient Medications' Section for Anesthetic Order Entry) o Lidocaine applied to wound bed Edema Control - Lymphedema / Segmental Compressive Device / Other Left Lower Extremity o Optional: One layer of unna paste to top of compression wrap (to act as an anchor). o Elevate, Exercise Daily and Avoid Standing for Long Periods of Time. o Elevate legs to the level of the heart and pump ankles as often as possible o Elevate leg(s) parallel to the floor when sitting. Wound Treatment Wound #1 - Lower Leg Wound Laterality: Left, Posterior Peri-Wound Care: Desitin  Maximum Strength Ointment 4 (oz) Secondary Dressing: ABD Pad 5x9 (in/in) Discharge Instructions: Cover with ABD pad Secondary Dressing: Hydrofera Blue Ready Transfer Foam, 2.5x2.5 (in/in) Discharge Instructions: Apply to wound bed over non-stick dressing. Compression Wrap: Profore Lite LF 3 Multilayer Compression Bandaging System Discharge Instructions: Apply 3 multi-layer wrap as prescribed. Electronic Signature(s) Signed: 08/08/2020 5:25:07 PM By: [name], [name], RN, [name], [name] RN, [name] Signed: 08/09/2020 7:59:07 AM By: Linton Ham MD Entered By: [name], [name], RN, [name], [name] on 08/08/2020 15:49:25 [name] ([id number]) -------------------------------------------------------------------------------- Problem List Details Patient Name: [name], KINCY. Date of Service: 08/08/2020 3:00 PM Medical Record Number: [id number] Patient Account Number: [id number] Date of Birth/Sex: February 04, 1946 (75 y.o. M) Treating RN: [name] Primary Care Provider: [name] Other Clinician: [name] Referring Provider: [name] Treating Provider/Extender: [name]: 7 Active Problems ICD-10 Encounter Code Description Active Date MDM Diagnosis S80.812D Abrasion, left lower leg, subsequent encounter 06/20/2020 No Yes L97.228 Non-pressure chronic ulcer of left calf with other specified severity 06/20/2020 No Yes I87.312 Chronic venous hypertension (idiopathic) with ulcer of left lower 06/20/2020 No Yes extremity Inactive Problems Resolved Problems Electronic Signature(s) Signed: 08/09/2020 7:59:07 AM By: Linton Ham MD Entered By: Linton Ham on 08/08/2020 16:27:52 [name] ([id number]) -------------------------------------------------------------------------------- Progress Note Details Patient Name: Gary Merlin A. Date of Service: 08/08/2020 3:00 PM Medical Record Number: [id number] Patient Account Number: [id number] Date of Birth/Sex: [birth year] (75 y.o.  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 He is on his feet for long hours. He tells [place] that in early February he scratched  himself with his fingernails on his posterior calf unbeknownst to him causing a wound. He was admitted to hospital from 06/11/2020 through 06/13/2020 with left leg cellulitis. Blood cultures were negative and ultrasound was negative for DVT and no abscess was found. He has completed antibiotics. He is using a Neosporin and a Band-Aid over this area. Past medical history includes type 2 diabetes, sick sinus syndrome, hypertension, coronary artery disease, pacemaker. He is not on oral anticoagulants ABI in our clinic was 1.26 on the left 2/23; patient who presented to clinic last week having a minor trauma to his leg however he required admitting to the hospital for treatment of left leg cellulitis. Small wound on the left posterior calf however completely necrotic last week requiring an aggressive debridement. We put him in 3 layer compression with Iodoflex. He tolerated this well 3/2 patient continues to have a small wound on the left posterior calf however still with a mostly necrotic surface. Still requires debridement we have been using Iodoflex under compression. Original wound was minor trauma complicated by cellulitis earlier this month. 07/11/2020 upon evaluation today patient appears to be doing well with regard to his wound on the posterior leg. We have been using Iodoflex which I do feel like it is helpful for him. Is helping to clean up the wound bed. With that being said there is little bit of irritation around the edges of the wound I think the possible  using a little Desitin here will help protect that region. Overall I think the cellulitis has improved and that is doing quite well. My main issue that I see is just that of slough buildup in the middle portion of the wound. 3/16; left posterior lower leg. Much better wound surface with a rim of surrounding epithelialization. We have been using Iodoflex but I felt we could change him to [facility] today 3/23; left posterior lower leg continued improvement in surface area. Nice rim of surrounding epithelialization. Even under illumination the wound looks healthy 3/30; left posterior lower leg now down to 2 tiny open areas. Quite an improvement in surface area. No need for debridement should be healed by next 4/6; left posterior leg wound still the same 2 wounds there has been an improvement in surface area especially in the larger wound. No debridement done Objective Constitutional Sitting or standing Blood Pressure is within target range for patient.. Pulse regular and within target range for patient.Marland Kitchen Respirations regular, non- labored and within target range.. Temperature is normal and within the target range for the patient.Marland Kitchen appears in no distress. Vitals Time Taken: 3:12 PM, Height: 75 in, Weight: 239 lbs, BMI: 29.9, Temperature: 97.7 F, Pulse: 78 bpm, Respiratory Rate: 18 breaths/min, Blood Pressure: 134/83 mmHg. Cardiovascular Edema control is good. General Notes: Is on the lowWound exam; posterior calf. The larger wound superiorly is almost completely closed surrounding epithelialization. The second 1 inferiorly is tiny but slough covered. This is very difficult to consider debridement is just too small Integumentary (Hair, Skin) Wound #1 status is Open. Original cause of wound was Skin Tear/Laceration. The date acquired was: 06/07/2020. The wound has been in treatment 7 weeks. The wound is located on the Left,Posterior Lower Leg. The wound measures 0.3cm length x 0.2cm width x  0.1cm depth; 0.047cm^2 area and 0.005cm^3 volume. There is Fat Layer (Subcutaneous Tissue) exposed. There is no tunneling or undermining noted. There is a small amount of serous drainage noted. There is large (67-100%) red granulation within the wound bed. There is  no necrotic tissue within the wound bed. [name], [name] ([id number]) Assessment Active Problems ICD-10 Abrasion, left lower leg, subsequent encounter Non-pressure chronic ulcer of left calf with other specified severity Chronic venous hypertension (idiopathic) with ulcer of left lower extremity Procedures Wound #1 Pre-procedure diagnosis of Wound #1 is a Trauma, Other located on the Left,Posterior Lower Leg . There was a Three Layer Compression Therapy Procedure with a pre-treatment ABI of 1.3 by [name], RN. Post procedure Diagnosis Wound #1: Same as Pre-Procedure Plan Follow-up Appointments: Return Appointment in 1 week. Nurse Visit as needed Bathing/ Shower/ Hygiene: Wash wounds with antibacterial soap and water. May shower with wound dressing protected with water repellent cover or cast protector. Anesthetic (Use 'Patient Medications' Section for Anesthetic Order Entry): Lidocaine applied to wound bed Edema Control - Lymphedema / Segmental Compressive Device / Other: Optional: One layer of unna paste to top of compression wrap (to act as an anchor). Elevate, Exercise Daily and Avoid Standing for Long Periods of Time. Elevate legs to the level of the heart and pump ankles as often as possible Elevate leg(s) parallel to the floor when sitting. WOUND #1: - Lower Leg Wound Laterality: Left, Posterior Peri-Wound Care: Desitin Maximum Strength Ointment 4 (oz) Secondary Dressing: ABD Pad 5x9 (in/in) Discharge Instructions: Cover with ABD pad Secondary Dressing: Hydrofera Blue Ready Transfer Foam, 2.5x2.5 (in/in) Discharge Instructions: Apply to wound bed over non-stick dressing. Compression Wrap: Profore Lite LF 3  Multilayer Compression Bandaging System Discharge Instructions: Apply 3 multi-layer wrap as prescribed. 1. I continued with Hydrofera Blue 2. The smaller wound may need some form of debridement although it so tiny it will not be easy to do. Electronic Signature(s) Signed: 08/09/2020 7:59:07 AM By: Linton Ham MD Entered By: Linton Ham on 08/08/2020 16:30:23 [name] ([id number]) -------------------------------------------------------------------------------- SuperBill Details Patient Name: [name], Gary A. Date of Service: 08/08/2020 Medical Record Number: [id number] Patient Account Number: [id number] Date of Birth/Sex: August 27, 1945 (75 y.o. M) Treating RN: [name] Primary Care Provider: [name] Other Clinician: [name] Referring Provider: [name] Treating Provider/Extender: [name]: 7 Diagnosis Coding ICD-10 Codes Code Description S80.812D Abrasion, left lower leg, subsequent encounter L97.228 Non-pressure chronic ulcer of left calf with other specified severity I87.312 Chronic venous hypertension (idiopathic) with ulcer of left lower extremity Facility Procedures CPT4 Code: [id number] Description: (Facility Use Only) [phone] - [facility] LT LEG Modifier: Quantity: 1 Physician Procedures CPT4 Code: 8280034 Description: 91791 - WC PHYS LEVEL 3 - EST PT Modifier: Quantity: 1 CPT4 Code: Description: ICD-10 Diagnosis Description L97.228 Non-pressure chronic ulcer of left calf with other specified severity I87.312 Chronic venous hypertension (idiopathic) with ulcer of left lower extr T05.697X Abrasion, left lower leg, subsequent encounter Modifier: emity Quantity: Electronic Signature(s) Signed: 08/09/2020 7:59:07 AM By: Linton Ham MD Entered By: Linton Ham on 08/08/2020 16:30:42

## 2020-08-15 ENCOUNTER — Other Ambulatory Visit: Payer: Self-pay

## 2020-08-15 ENCOUNTER — Encounter: Payer: Medicare HMO | Admitting: Internal Medicine

## 2020-08-15 DIAGNOSIS — S80812A Abrasion, left lower leg, initial encounter: Secondary | ICD-10-CM | POA: Diagnosis not present

## 2020-08-15 DIAGNOSIS — L03116 Cellulitis of left lower limb: Secondary | ICD-10-CM | POA: Diagnosis not present

## 2020-08-15 DIAGNOSIS — E11622 Type 2 diabetes mellitus with other skin ulcer: Secondary | ICD-10-CM | POA: Diagnosis not present

## 2020-08-15 DIAGNOSIS — L97228 Non-pressure chronic ulcer of left calf with other specified severity: Secondary | ICD-10-CM | POA: Diagnosis not present

## 2020-08-15 DIAGNOSIS — Z950 Presence of cardiac pacemaker: Secondary | ICD-10-CM | POA: Diagnosis not present

## 2020-08-15 DIAGNOSIS — S81812A Laceration without foreign body, left lower leg, initial encounter: Secondary | ICD-10-CM | POA: Diagnosis not present

## 2020-08-15 DIAGNOSIS — I10 Essential (primary) hypertension: Secondary | ICD-10-CM | POA: Diagnosis not present

## 2020-08-15 NOTE — Progress Notes (Signed)
EYDAN, CHIANESE ([id number]) Visit Report for 08/15/2020 Arrival Information Details Patient Name: [name], [name]. Date of Service: 08/15/2020 1:00 PM Medical Record Number: [id number] Patient Account Number: [id number] Date of Birth/Sex: 1946-02-11 (75 y.o. M) Treating RN: [name] Primary Care Vonna Brabson: [name] Other Clinician: [name] Referring Jamesyn Lindell: [name] Treating Luria Rosario/Extender: [name]: 8 Visit Information History Since Last Visit Added or deleted any medications: No Patient Arrived: Ambulatory Had a fall or experienced change in No Arrival Time: 12:57 activities of daily living that may affect Accompanied By: self risk of falls: Transfer Assistance: None Hospitalized since last visit: No Pain Present Now: No Electronic Signature(s) Signed: 08/15/2020 4:32:52 PM By: [name] Entered By: [name] on 08/15/2020 12:57:28 [name] ([id number]) -------------------------------------------------------------------------------- Clinic Level of Care Assessment Details Patient Name: Gary Merlin A. Date of Service: 08/15/2020 1:00 PM Medical Record Number: [id number] Patient Account Number: [id number] Date of Birth/Sex: 1945/10/11 (75 y.o. M) Treating RN: [name] Primary Care Shadoe Bethel: [name] Other Clinician: [name] Referring Orlondo Holycross: [name] Treating Mykai Wendorf/Extender: [name]: 8 Clinic Level of Care Assessment Items TOOL 4 Quantity Score X - Use when only an EandM is performed on FOLLOW-UP visit 1 0 ASSESSMENTS - Nursing Assessment / Reassessment X - Reassessment of Co-morbidities (includes updates in patient status) 1 10 X- 1 5 Reassessment of Adherence to Treatment Plan ASSESSMENTS - Wound and Skin Assessment / Reassessment X - Simple Wound Assessment / Reassessment - one wound 1 5 []  - 0 Complex Wound Assessment / Reassessment -  multiple wounds []  - 0 Dermatologic / Skin Assessment (not related to wound area) ASSESSMENTS - Focused Assessment []  - Circumferential Edema Measurements - multi extremities 0 []  - 0 Nutritional Assessment / Counseling / Intervention []  - 0 Lower Extremity Assessment (monofilament, tuning fork, pulses) []  - 0 Peripheral Arterial Disease Assessment (using hand held doppler) ASSESSMENTS - Ostomy and/or Continence Assessment and Care []  - Incontinence Assessment and Management 0 []  - 0 Ostomy Care Assessment and Management (repouching, etc.) PROCESS - Coordination of Care X - Simple Patient / Family Education for ongoing care 1 15 []  - 0 Complex (extensive) Patient / Family Education for ongoing care []  - 0 Staff obtains Programmer, systems, Records, Test Results / Process Orders []  - 0 Staff telephones HHA, Nursing Homes / Clarify orders / etc []  - 0 Routine Transfer to another Facility (non-emergent condition) []  - 0 Routine Hospital Admission (non-emergent condition) []  - 0 New Admissions / Biomedical engineer / Ordering NPWT, Apligraf, etc. []  - 0 Emergency Hospital Admission (emergent condition) X- 1 10 Simple Discharge Coordination []  - 0 Complex (extensive) Discharge Coordination PROCESS - Special Needs []  - Pediatric / Minor Patient Management 0 []  - 0 Isolation Patient Management []  - 0 Hearing / Language / Visual special needs []  - 0 Assessment of Community assistance (transportation, D/C planning, etc.) []  - 0 Additional assistance / Altered mentation []  - 0 Support Surface(s) Assessment (bed, cushion, seat, etc.) INTERVENTIONS - Wound Cleansing / Measurement [name], Gary A. ([id number]) X- 1 5 Simple Wound Cleansing - one wound []  - 0 Complex Wound Cleansing - multiple wounds X- 1 5 Wound Imaging (photographs - any number of wounds) []  - 0 Wound Tracing (instead of photographs) X- 1 5 Simple Wound Measurement - one wound []  - 0 Complex Wound Measurement  - multiple wounds INTERVENTIONS - Wound Dressings []  - Small Wound Dressing one or multiple wounds 0 []  -  0 Medium Wound Dressing one or multiple wounds []  - 0 Large Wound Dressing one or multiple wounds []  - 0 Application of Medications - topical []  - 0 Application of Medications - injection INTERVENTIONS - Miscellaneous []  - External ear exam 0 []  - 0 Specimen Collection (cultures, biopsies, blood, body fluids, etc.) []  - 0 Specimen(s) / Culture(s) sent or taken to Lab for analysis []  - 0 Patient Transfer (multiple staff / Civil Service fast streamer / Similar devices) []  - 0 Simple Staple / Suture removal (25 or less) []  - 0 Complex Staple / Suture removal (26 or more) []  - 0 Hypo / Hyperglycemic Management (close monitor of Blood Glucose) []  - 0 Ankle / Brachial Index (ABI) - do not check if billed separately X- 1 5 Vital Signs Has the patient been seen at the hospital within the last three years: Yes Total Score: 65 Level Of Care: New/Established - Level 2 Electronic Signature(s) Signed: 08/15/2020 4:47:42 PM By: [name], [name] RN Entered By: [name], [name] on 08/15/2020 13:25:44 [name] ([id number]) -------------------------------------------------------------------------------- Encounter Discharge Information Details Patient Name: Gary Merlin A. Date of Service: 08/15/2020 1:00 PM Medical Record Number: [id number] Patient Account Number: [id number] Date of Birth/Sex: 04/14/1946 (75 y.o. M) Treating RN: [name] Primary Care Ivory Bail: [name] Other Clinician: [name] Referring Mikhail Hallenbeck: [name] Treating Chasta Deshpande/Extender: [name]: 8 Encounter Discharge Information Items Discharge Condition: Stable Ambulatory Status: Ambulatory Discharge Destination: Home Transportation: Private Auto Accompanied By: self Schedule Follow-up Appointment: No Clinical Summary of Care: Electronic Signature(s) Signed:  08/15/2020 4:47:42 PM By: [name], [name] RN Entered By: [name], [name] on 08/15/2020 13:31:14 [name] ([id number]) -------------------------------------------------------------------------------- Lower Extremity Assessment Details Patient Name: [name], Gary A. Date of Service: 08/15/2020 1:00 PM Medical Record Number: [id number] Patient Account Number: [id number] Date of Birth/Sex: 1945/09/01 (75 y.o. M) Treating RN: [name] Primary Care Macyn Shropshire: [name] Other Clinician: [name] Referring Caelin Rayl: [name] Treating Kriti Katayama/Extender: [name]: 8 Edema Assessment Assessed: [Left: Yes] [Right: No] Edema: [Left: Ye] [Right: s] Calf Left: Right: Point of Measurement: 31 cm From Medial Instep 36.3 cm Ankle Left: Right: Point of Measurement: 10 cm From Medial Instep 23 cm Vascular Assessment Pulses: Dorsalis Pedis Palpable: [Left:Yes] Electronic Signature(s) Signed: 08/15/2020 4:32:52 PM By: [name] Signed: 08/15/2020 4:47:42 PM By: [name], [name] RN Entered By: [name] on 08/15/2020 13:09:52 [name] ([id number]) -------------------------------------------------------------------------------- Multi Wound Chart Details Patient Name: Gary Merlin A. Date of Service: 08/15/2020 1:00 PM Medical Record Number: [id number] Patient Account Number: [id number] Date of Birth/Sex: 1946-01-27 (75 y.o. M) Treating RN: [name] Primary Care Shariyah Eland: [name] Other Clinician: [name] Referring Petra Dumler: [name] Treating Lida Berkery/Extender: [name]: 8 Vital Signs Height(in): 75 Pulse(bpm): 75 Weight(lbs): 53 Blood Pressure(mmHg): 143/89 Body Mass Index(BMI): 30 Temperature(F): 97.8 Respiratory Rate(breaths/min): 18 Photos: [N/A:N/A] Wound Location: Left, Posterior Lower Leg N/A N/A Wounding Event: Skin Tear/Laceration N/A  N/A Primary Etiology: Trauma, Other N/A N/A Comorbid History: Cataracts, Coronary Artery Disease, N/A N/A Hypertension, Gout, Received Chemotherapy Date Acquired: 06/07/2020 N/A N/A Weeks of Treatment: 8 N/A N/A Wound Status: Open N/A N/A Measurements L x W x D (cm) 0x0x0 N/A N/A Area (cm) : 0 N/A N/A Volume (cm) : 0 N/A N/A % Reduction in Area: 100.00% N/A N/A % Reduction in Volume: 100.00% N/A N/A Classification: Full Thickness Without Exposed N/A N/A Support Structures Exudate Amount: None Present N/A N/A Granulation  Amount: None Present (0%) N/A N/A Necrotic Amount: None Present (0%) N/A N/A Exposed Structures: Fascia: No N/A N/A Fat Layer (Subcutaneous Tissue): No Tendon: No Muscle: No Joint: No Bone: No Epithelialization: Large (67-100%) N/A N/A Treatment Notes Electronic Signature(s) Signed: 08/15/2020 5:03:12 PM By: Linton Ham MD Entered By: Linton Ham on 08/15/2020 13:26:33 [name] ([id number]) -------------------------------------------------------------------------------- [place] Details Patient Name: [name], Gary A. Date of Service: 08/15/2020 1:00 PM Medical Record Number: [id number] Patient Account Number: [id number] Date of Birth/Sex: 06-27-1945 (75 y.o. M) Treating RN: [name] Primary Care Tsugio Elison: [name] Other Clinician: [name] Referring Aws Shere: [name] Treating Nalaya Wojdyla/Extender: [name]: 8 Active Inactive Electronic Signature(s) Signed: 08/15/2020 4:47:42 PM By: [name], [name] RN Entered By: [name], [name] on 08/15/2020 13:24:23 [name] ([id number]) -------------------------------------------------------------------------------- Pain Assessment Details Patient Name: [name], Gary A. Date of Service: 08/15/2020 1:00 PM Medical Record Number: [id number] Patient Account Number: [id number] Date of Birth/Sex: 1945/09/10 (75 y.o.  M) Treating RN: [name] Primary Care Anahita Cua: [name] Other Clinician: [name] Referring Noga Fogg: [name] Treating Dijon Kohlman/Extender: [name]: 8 Active Problems Location of Pain Severity and Description of Pain Patient Has Paino No Site Locations Rate the pain. Current Pain Level: 0 Pain Management and Medication Current Pain Management: Electronic Signature(s) Signed: 08/15/2020 4:32:52 PM By: [name] Signed: 08/15/2020 4:47:42 PM By: [name], [name] RN Entered By: [name] on 08/15/2020 12:57:55 [name] ([id number]) -------------------------------------------------------------------------------- Patient/Caregiver Education Details Patient Name: Gary Merlin A. Date of Service: 08/15/2020 1:00 PM Medical Record Number: [id number] Patient Account Number: [id number] Date of Birth/Gender: 1945/07/06 (75 y.o. M) Treating RN: [name] Primary Care Physician: [name] Other Clinician: [name] Referring Physician: [name] Treating Physician/Extender: [name]: 8 Education Assessment Education Provided To: Patient Education Topics Provided Wound/Skin Impairment: Methods: Explain/Verbal Responses: State content correctly Notes Do not scrub area when showering, continue wearing compression Electronic Signature(s) Signed: 08/15/2020 4:47:42 PM By: [name], [name] RN Entered By: [name], Kenia on 08/15/2020 13:26:19 [name] ([id number]) -------------------------------------------------------------------------------- Wound Assessment Details Patient Name: Gary Merlin A. Date of Service: 08/15/2020 1:00 PM Medical Record Number: [id number] Patient Account Number: [id number] Date of Birth/Sex: 04-08-1946 (75 y.o. M) Treating RN: [name] Primary Care Moriya Mitchell: [name] Other Clinician: [name] Referring  Missi Mcmackin: [name] Treating Jezebelle Ledwell/Extender: [name]: 8 Wound Status Wound Number: 1 Primary Trauma, Other Etiology: Wound Location: Left, Posterior Lower Leg Wound Status: Open Wounding Event: Skin Tear/Laceration Comorbid Cataracts, Coronary Artery Disease, Hypertension, Date Acquired: 06/07/2020 History: Gout, Received Chemotherapy Weeks Of Treatment: 8 Clustered Wound: No Photos Wound Measurements Length: (cm) 0 Width: (cm) 0 Depth: (cm) 0 Area: (cm) 0 Volume: (cm) 0 % Reduction in Area: 100% % Reduction in Volume: 100% Epithelialization: Large (67-100%) Tunneling: No Undermining: No Wound Description Classification: Full Thickness Without Exposed Support Structures Exudate Amount: None Present Foul Odor After Cleansing: No Slough/Fibrino No Wound Bed Granulation Amount: None Present (0%) Exposed Structure Necrotic Amount: None Present (0%) Fascia Exposed: No Fat Layer (Subcutaneous Tissue) Exposed: No Tendon Exposed: No Muscle Exposed: No Joint Exposed: No Bone Exposed: No Electronic Signature(s) Signed: 08/15/2020 4:47:42 PM By: [name], [name] RN Entered By: [name], [name] on 08/15/2020 13:24:04 [name] ([id number]) -------------------------------------------------------------------------------- Vitals Details Patient Name: Gary Merlin A. Date of Service: 08/15/2020 1:00 PM Medical Record Number: [id number] Patient Account Number: [id number] Date of  Birth/Sex: 10-24-1945 (75 y.o. M) Treating RN: [name] Primary Care Baraa Tubbs: [name] Other Clinician: [name] Referring Anthoni Geerts: [name] Treating Berdell Hostetler/Extender: [name]: 8 Vital Signs Time Taken: 12:55 Temperature (F): 97.8 Height (in): 75 Pulse (bpm): 75 Weight (lbs): 239 Respiratory Rate (breaths/min): 18 Body Mass Index (BMI): 29.9 Blood Pressure (mmHg): 143/89 Reference Range:  80 - 120 mg / dl Electronic Signature(s) Signed: 08/15/2020 4:32:52 PM By: [name] Entered By: [name] on 08/15/2020 12:57:48

## 2020-08-15 NOTE — Progress Notes (Signed)
MASAICHI, KRACHT ([id number]) Visit Report for 08/15/2020 HPI Details Patient Name: [name]. Date of Service: 08/15/2020 1:00 PM Medical Record Number: [id number] Patient Account Number: [id number] Date of Birth/Sex: 1945-10-07 (75 y.o. 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 He is on his feet for long hours. He tells [place] that in early February he scratched himself with his fingernails on his posterior calf unbeknownst to him causing a wound. He was admitted to hospital from 06/11/2020 through 06/13/2020 with left leg cellulitis. Blood cultures were negative and ultrasound was negative for DVT and no abscess was found. He has completed antibiotics. He is using a Neosporin and a Band-Aid over this area. Past medical history includes type 2 diabetes, sick sinus syndrome, hypertension, coronary artery disease, pacemaker. He is not on oral anticoagulants ABI in our clinic was 1.26 on the left 2/23; patient who presented to clinic last week having a minor trauma to his leg however he required admitting to the hospital for treatment of left leg cellulitis. Small wound on the left posterior calf however completely necrotic last week requiring an aggressive debridement. We put him in 3 layer compression with Iodoflex. He tolerated this well 3/2 patient continues to have a small wound on the left posterior calf however still with a mostly necrotic surface. Still requires debridement we have been using Iodoflex under compression. Original wound was minor trauma complicated by cellulitis earlier this month. 07/11/2020 upon evaluation today patient appears to be doing well with regard to his  wound on the posterior leg. We have been using Iodoflex which I do feel like it is helpful for him. Is helping to clean up the wound bed. With that being said there is little bit of irritation around the edges of the wound I think the possible using a little Desitin here will help protect that region. Overall I think the cellulitis has improved and that is doing quite well. My main issue that I see is just that of slough buildup in the middle portion of the wound. 3/16; left posterior lower leg. Much better wound surface with a rim of surrounding epithelialization. We have been using Iodoflex but I felt we could change him to [facility] today 3/23; left posterior lower leg continued improvement in surface area. Nice rim of surrounding epithelialization. Even under illumination the wound looks healthy 3/30; left posterior lower leg now down to 2 tiny open areas. Quite an improvement in surface area. No need for debridement should be healed by next 4/6; left posterior leg wound still the same 2 wounds there has been an improvement in surface area especially in the larger wound. No debridement done 4/13; left posterior leg wound both are healed today. He is going to leave here and get some stockings at [facility]. I think this should be sufficient he does not have a lot of edema Electronic Signature(s) Signed: 08/15/2020 5:03:12 PM By: Linton Ham MD Entered By: Linton Ham on 08/15/2020 13:27:07 [name] ([id number]) -------------------------------------------------------------------------------- Physical Exam Details Patient Name: [name], [name] Date of Service: 08/15/2020 1:00 PM Medical Record Number: [id number] Patient Account Number: [id number] Date of Birth/Sex: July 14, 1945 (75 y.o. M) Treating RN: [name] Primary Care Provider: [name] Other Clinician: [name] Referring  Provider: [name] Treating Provider/Extender: [name] in  Treatment: 8 Constitutional Sitting or standing Blood Pressure is within target range for patient.. Pulse regular and within target range for patient.Marland Kitchen Respirations regular, non- labored and within target range.. Temperature is normal and within the target range for the patient.Marland Kitchen appears in no distress. Notes Wound exam; both wound areas are closed. He did have an eschar over the larger area I remove this this is epithelialized. No surrounding infection Electronic Signature(s) Signed: 08/15/2020 5:03:12 PM By: Linton Ham MD Entered By: Linton Ham on 08/15/2020 13:28:11 [name] ([id number]) -------------------------------------------------------------------------------- Physician Orders Details Patient Name: Gary Merlin A. Date of Service: 08/15/2020 1:00 PM Medical Record Number: [id number] Patient Account Number: [id number] Date of Birth/Sex: 1945/12/31 (75 y.o. M) Treating RN: [name] Primary Care Provider: [name] Other Clinician: [name] Referring Provider: [name] Treating Provider/Extender: [name]: 8 Verbal / Phone Orders: No Diagnosis Coding Discharge From [facility] Services o Discharge from [facility] o Wear compression garments daily. Put garments on first thing when you wake up and remove them before bed. - Tubi Grip E applied in office today o Moisturize legs daily after removing compression garments. Electronic Signature(s) Signed: 08/15/2020 4:47:42 PM By: [name], [name] RN Signed: 08/15/2020 5:03:12 PM By: Linton Ham MD Entered By: [name], [name] on 08/15/2020 13:25:13 [name] ([id number]) -------------------------------------------------------------------------------- Problem List Details Patient Name: [name], [name] Date of Service: 08/15/2020 1:00 PM Medical Record Number: [id number] Patient Account Number: [id number] Date of Birth/Sex:  01/12/1946 (75 y.o. M) Treating RN: [name] Primary Care Provider: [name] Other Clinician: [name] Referring Provider: [name] Treating Provider/Extender: [name]: 8 Active Problems ICD-10 Encounter Code Description Active Date MDM Diagnosis S80.812D Abrasion, left lower leg, subsequent encounter 06/20/2020 No Yes L97.228 Non-pressure chronic ulcer of left calf with other specified severity 06/20/2020 No Yes I87.312 Chronic venous hypertension (idiopathic) with ulcer of left lower 06/20/2020 No Yes extremity Inactive Problems Resolved Problems Electronic Signature(s) Signed: 08/15/2020 5:03:12 PM By: Linton Ham MD Entered By: Linton Ham on 08/15/2020 13:26:23 [name] ([id number]) -------------------------------------------------------------------------------- Progress Note Details Patient Name: Gary Merlin A. Date of Service: 08/15/2020 1:00 PM Medical Record Number: [id number] Patient Account Number: [id number] Date of Birth/Sex: 1946-03-01 (75 y.o. 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 He is on his feet for long hours. He tells [place] that in early February he scratched himself with his fingernails on his posterior calf unbeknownst to him causing a wound. He was admitted to hospital from 06/11/2020 through 06/13/2020 with left leg cellulitis. Blood cultures were negative and ultrasound was negative for DVT and no abscess was found. He has completed antibiotics. He is using a Neosporin and a Band-Aid over this area. Past medical history includes type 2 diabetes, sick sinus syndrome, hypertension,  coronary artery disease, pacemaker. He is not on oral anticoagulants ABI in our clinic was 1.26 on the left 2/23; patient who presented to clinic last week having a minor trauma to his leg however he required admitting to the hospital for treatment of left leg cellulitis. Small wound on the left posterior calf however completely necrotic last week  requiring an aggressive debridement. We put him in 3 layer compression with Iodoflex. He tolerated this well 3/2 patient continues to have a small wound on the left posterior calf however still with a mostly necrotic surface. Still requires debridement we have been using Iodoflex under compression. Original wound was minor trauma complicated by cellulitis earlier this month. 07/11/2020 upon evaluation today patient appears to be doing well with regard to his wound on the posterior leg. We have been using Iodoflex which I do feel like it is helpful for him. Is helping to clean up the wound bed. With that being said there is little bit of irritation around the edges of the wound I think the possible using a little Desitin here will help protect that region. Overall I think the cellulitis has improved and that is doing quite well. My main issue that I see is just that of slough buildup in the middle portion of the wound. 3/16; left posterior lower leg. Much better wound surface with a rim of surrounding epithelialization. We have been using Iodoflex but I felt we could change him to [facility] today 3/23; left posterior lower leg continued improvement in surface area. Nice rim of surrounding epithelialization. Even under illumination the wound looks healthy 3/30; left posterior lower leg now down to 2 tiny open areas. Quite an improvement in surface area. No need for debridement should be healed by next 4/6; left posterior leg wound still the same 2 wounds there has been an improvement in surface area especially in the larger wound. No debridement  done 4/13; left posterior leg wound both are healed today. He is going to leave here and get some stockings at [facility]. I think this should be sufficient he does not have a lot of edema Objective Constitutional Sitting or standing Blood Pressure is within target range for patient.. Pulse regular and within target range for patient.Marland Kitchen Respirations regular, non- labored and within target range.. Temperature is normal and within the target range for the patient.Marland Kitchen appears in no distress. Vitals Time Taken: 12:55 PM, Height: 75 in, Weight: 239 lbs, BMI: 29.9, Temperature: 97.8 F, Pulse: 75 bpm, Respiratory Rate: 18 breaths/min, Blood Pressure: 143/89 mmHg. General Notes: Wound exam; both wound areas are closed. He did have an eschar over the larger area I remove this this is epithelialized. No surrounding infection Integumentary (Hair, Skin) Wound #1 status is Open. Original cause of wound was Skin Tear/Laceration. The date acquired was: 06/07/2020. The wound has been in treatment 8 weeks. The wound is located on the Left,Posterior Lower Leg. The wound measures 0cm length x 0cm width x 0cm depth; 0cm^2 area and 0cm^3 volume. There is no tunneling or undermining noted. There is a none present amount of drainage noted. There is no granulation within the wound bed. There is no necrotic tissue within the wound bed. ROTH, RESS ([id number]) Assessment Active Problems ICD-10 Abrasion, left lower leg, subsequent encounter Non-pressure chronic ulcer of left calf with other specified severity Chronic venous hypertension (idiopathic) with ulcer of left lower extremity Plan Discharge From [facility] Services: Discharge from [facility] Wear compression garments daily. Put garments on first thing when you wake up and remove them before bed. - Tubi Grip E applied in office today Moisturize legs daily after removing compression garments. 1. Patient is discharged to his own support  stockings. This may be sufficient. He does not have a lot of edema. This may may have been traumatic. 2. He still works in  a meat processing area on his feet for long periods if he comes back he may ultimately require more aggressive compression Electronic Signature(s) Signed: 08/15/2020 5:03:12 PM By: Linton Ham MD Entered By: Linton Ham on 08/15/2020 13:29:02 [name] ([id number]) -------------------------------------------------------------------------------- [facility] Details Patient Name: Gary Merlin A. Date of Service: 08/15/2020 Medical Record Number: [id number] Patient Account Number: [id number] Date of Birth/Sex: [birth year] (75 y.o. M) Treating RN: [name] Primary Care Provider: [name] Other Clinician: [name] Referring Provider: [name] Treating Provider/Extender: [name]: 8 Diagnosis Coding ICD-10 Codes Code Description S80.812D Abrasion, left lower leg, subsequent encounter L97.228 Non-pressure chronic ulcer of left calf with other specified severity I87.312 Chronic venous hypertension (idiopathic) with ulcer of left lower extremity Facility Procedures CPT4 Code: [id number] Description: [phone] - WOUND CARE VISIT-LEV 2 EST PT Modifier: Quantity: 1 Physician Procedures CPT4 Code: [id number] Description: 62947 - WC PHYS LEVEL 3 - EST PT Modifier: Quantity: 1 CPT4 Code: Description: ICD-10 Diagnosis Description L97.228 Non-pressure chronic ulcer of left calf with other specified severity S80.812D Abrasion, left lower leg, subsequent encounter I87.312 Chronic venous hypertension (idiopathic) with ulcer of left lower extr Modifier: emity Quantity: Electronic Signature(s) Signed: 08/15/2020 5:03:12 PM By: Linton Ham MD Entered By: Linton Ham on 08/15/2020 13:29:34

## 2020-08-28 ENCOUNTER — Other Ambulatory Visit: Payer: Self-pay | Admitting: Internal Medicine

## 2020-09-04 ENCOUNTER — Other Ambulatory Visit: Payer: Self-pay | Admitting: Gastroenterology

## 2020-09-10 ENCOUNTER — Telehealth: Payer: Self-pay | Admitting: Gastroenterology

## 2020-09-10 NOTE — Telephone Encounter (Signed)
Please call patient's daughter regarding refill , she said pharmacy could not get in touch with [place].  Does patient need an office visit for RX refill?

## 2020-09-11 ENCOUNTER — Other Ambulatory Visit: Payer: Self-pay

## 2020-09-11 MED ORDER — SUCRALFATE 1 G PO TABS: 1.0000 g | ORAL_TABLET | Freq: Four times a day (QID) | ORAL | 3 refills | Status: DC

## 2020-09-11 NOTE — Telephone Encounter (Signed)
Sent message to Dr. Vicente Males regarding a medication refill that was prescribed by the attending physician in the ER. Daughter called to ask if Dr. Vicente Males would refill the Sucralfate 1 g for her father. Dr. Vicente Males agreed to refill. See patient call note. Daughter has been notified and Rx sent to pharmacy.

## 2020-09-12 ENCOUNTER — Other Ambulatory Visit: Payer: Self-pay

## 2020-09-12 MED ORDER — SUCRALFATE 1 G PO TABS: 1.0000 g | ORAL_TABLET | Freq: Four times a day (QID) | ORAL | 3 refills | Status: DC

## 2020-09-12 NOTE — Telephone Encounter (Signed)
Patient's daughter, Enid Derry, called and stated that [facility] says they do not have medication refill request.  Please call Enid Derry at  [phone] to advise

## 2020-09-12 NOTE — Telephone Encounter (Signed)
Resent Rx to pharmacy. Informed daughter if not there just call the office back.

## 2020-09-25 ENCOUNTER — Telehealth: Payer: Self-pay | Admitting: Gastroenterology

## 2020-09-25 ENCOUNTER — Ambulatory Visit (INDEPENDENT_AMBULATORY_CARE_PROVIDER_SITE_OTHER): Payer: Medicare HMO | Admitting: Nurse Practitioner

## 2020-09-25 ENCOUNTER — Other Ambulatory Visit: Payer: Self-pay

## 2020-09-25 ENCOUNTER — Encounter: Payer: Self-pay | Admitting: Nurse Practitioner

## 2020-09-25 VITALS — BP 146/88 | HR 66 | Temp 98.5°F | Resp 16 | Ht 75.0 in | Wt 244.6 lb

## 2020-09-25 DIAGNOSIS — K219 Gastro-esophageal reflux disease without esophagitis: Secondary | ICD-10-CM

## 2020-09-25 DIAGNOSIS — I10 Essential (primary) hypertension: Secondary | ICD-10-CM | POA: Diagnosis not present

## 2020-09-25 DIAGNOSIS — E291 Testicular hypofunction: Secondary | ICD-10-CM | POA: Diagnosis not present

## 2020-09-25 DIAGNOSIS — L03116 Cellulitis of left lower limb: Secondary | ICD-10-CM | POA: Diagnosis not present

## 2020-09-25 DIAGNOSIS — E1165 Type 2 diabetes mellitus with hyperglycemia: Secondary | ICD-10-CM | POA: Diagnosis not present

## 2020-09-25 DIAGNOSIS — R5383 Other fatigue: Secondary | ICD-10-CM | POA: Diagnosis not present

## 2020-09-25 LAB — POCT GLYCOSYLATED HEMOGLOBIN (HGB A1C): Hemoglobin A1C: 6.1 % — AB (ref 4.0–5.6)

## 2020-09-25 MED ORDER — ESOMEPRAZOLE MAGNESIUM 40 MG PO CPDR
40.0000 mg | DELAYED_RELEASE_CAPSULE | Freq: Every day | ORAL | 3 refills | Status: DC
Start: 2020-09-25 — End: 2020-12-21

## 2020-09-25 NOTE — Progress Notes (Signed)
[facility] [place], [place] [address]  Internal MEDICINE  Office Visit Note  Patient Name: [name]  [id number]  [id number]  Date of Service: 09/25/2020  Chief Complaint  Patient presents with  . Follow-up    Med review, Lt leg swollen,   . Quality Metric Gaps    Eye exam  . Diabetes    HPI [name] presents for a follow-up for left leg swelling and medication review.  [name] is also due to have his A1c checked.  [name] had his annual physical exam in March 2022. -Discussed lab results from February with the patient.  Fasting glucose on the metabolic panel was elevated at 115.  His CBC was normal no anemia, his urinalysis in March was normal . -[name] is being followed by the wound clinic for an open wound and cellulitis to the left leg.  [name] sees the wound clinic weekly.  The last note from the wound clinic notes healing status is progressing well without complication.  [name] has previously completed inpatient and outpatient antibiotic therapy for the cellulitis.  Unable to visualize and assess the wound as it has been dressed by wound clinic staff and [name] will not be seen by them again until next week. [name] is interested in getting the COVID booster.  [name] was instructed to go to his pharmacy and ask for the booster as [name] does not need a prescription to get it. -[name] had a colonoscopy in September 2021 to screen for colorectal cancer, results were negative, repeat colonoscopy in 10 years. -[name] reports [name] has no acute complaints or concerns.  [name] reports [name] has a good appetite, no issues with constipation, [name] is sleeping well at night without difficulty.  Current Medication: Outpatient Encounter Medications as of 09/25/2020  Medication Sig  . Accu-Chek Softclix Lancets lancets Use as instructed twice a day DX E11.65  . allopurinol (ZYLOPRIM) 300 MG tablet TAKE 1 TABLET BY MOUTH AT BEDTIME FOR GOUT  . aspirin EC 81 MG tablet Take 81 mg by mouth daily. Swallow whole.  .  colchicine 0.6 MG tablet TAKE 1 TAB BY MOUTH TWICE DAILY X 3 DAYS FOR GOUT FLARE UP. OTHERWISE ONCE DAILY FOR PREVENTION  . diltiazem (CARTIA XT) 240 MG 24 hr capsule Take 1 capsule (240 mg total) by mouth daily.  Marland Kitchen glucose blood (ACCU-CHEK GUIDE) test strip Use as directed  twice a day Dx E11.65  . losartan-hydrochlorothiazide (HYZAAR) 100-12.5 MG tablet TAKE 1 TABLET BY MOUTH EVERY DAY  . metoprolol succinate (TOPROL-XL) 50 MG 24 hr tablet Take 50 mg by mouth daily.  . potassium chloride SA (KLOR-CON) 20 MEQ tablet Take 1 tablet by mouth daily.  . simvastatin (ZOCOR) 10 MG tablet TAKE 1 TABLET BY MOUTH EVERY DAY  . sucralfate (CARAFATE) 1 g tablet Take 1 tablet (1 g total) by mouth 4 (four) times daily.  . tamsulosin (FLOMAX) 0.4 MG CAPS capsule TAKE 1 CAPSULE BY MOUTH EVERY DAY  . metFORMIN (GLUCOPHAGE) 500 MG tablet Take 1 tablet (500 mg total) by mouth 2 (two) times daily with a meal.   No facility-administered encounter medications on file as of 09/25/2020.    Surgical History: Past Surgical History:  Procedure Laterality Date  . CARDIAC CATHETERIZATION    . COLONOSCOPY WITH PROPOFOL N/A 01/24/2020   Procedure: COLONOSCOPY WITH PROPOFOL;  Surgeon: [name], MD;  Location: [facility] ENDOSCOPY;  Service: Gastroenterology;  Laterality: N/A;  . CORONARY/GRAFT ACUTE MI REVASCULARIZATION N/A 05/02/2018   Procedure:  Coronary/Graft Acute MI Revascularization;  Surgeon: [name], MD;  Location: [place] [facility];  Service: Cardiovascular;  Laterality: N/A;  . HERNIA REPAIR    . LEFT HEART CATH AND CORONARY ANGIOGRAPHY N/A 05/02/2018   Procedure: LEFT HEART CATH AND CORONARY ANGIOGRAPHY;  Surgeon: [name], MD;  Location: [place] [facility];  Service: Cardiovascular;  Laterality: N/A;  . PACEMAKER INSERTION Left 05/03/2018   Procedure: INSERTION PACEMAKER;  Surgeon: [name], MD;  Location: [facility] ORS;  Service: Cardiovascular;  Laterality: Left;  . TEMPORARY  PACEMAKER N/A 05/02/2018   Procedure: TEMPORARY PACEMAKER;  Surgeon: [name], MD;  Location: [place] [facility];  Service: Cardiovascular;  Laterality: N/A;  . TEMPORARY PACEMAKER Right 05/03/2018   Procedure: TEMPORARY PACEMAKER REMOVAL;  Surgeon: [name], MD;  Location: [facility] ORS;  Service: Cardiovascular;  Laterality: Right;  . TONSILLECTOMY Bilateral    1992    Medical History: Past Medical History:  Diagnosis Date  . Diabetes mellitus without complication ([place])   . GERD (gastroesophageal reflux disease)   . Hyperlipidemia   . Hypertension     Family History: Family History  Problem Relation Age of Onset  . Cirrhosis Father   . Bone cancer Mother     Social History   Socioeconomic History  . Marital status: Married    Spouse name: Not on file  . Number of children: Not on file  . Years of education: Not on file  . Highest education level: Not on file  Occupational History  . Not on file  Tobacco Use  . Smoking status: Former Smoker    Types: Cigarettes  . Smokeless tobacco: Never Used  Vaping Use  . Vaping Use: Never used  Substance and Sexual Activity  . Alcohol use: No  . Drug use: No  . Sexual activity: Not on file  Other Topics Concern  . Not on file  Social History Narrative  . Not on file   Social Determinants of Health   Financial Resource Strain: Not on file  Food Insecurity: Not on file  Transportation Needs: Not on file  Physical Activity: Not on file  Stress: Not on file  Social Connections: Not on file  Intimate Partner Violence: Not on file      Review of Systems  Constitutional: Negative for chills, fatigue and unexpected weight change.  HENT: Negative for congestion, rhinorrhea, sneezing and sore throat.   Eyes: Negative for redness.  Respiratory: Negative for cough, chest tightness and shortness of breath.   Cardiovascular: Negative for chest pain and palpitations.  Gastrointestinal: Negative for  abdominal pain, constipation, diarrhea, nausea and vomiting.  Genitourinary: Negative for dysuria and frequency.  Musculoskeletal: Negative for arthralgias, back pain, joint swelling and neck pain.  Skin: Negative for rash.  Neurological: Negative.  Negative for tremors and numbness.  Hematological: Negative for adenopathy. Does not bruise/bleed easily.  Psychiatric/Behavioral: Negative for behavioral problems (Depression), sleep disturbance and suicidal ideas. The patient is not nervous/anxious.     Vital Signs: BP (!) 146/88   Pulse 66   Temp 98.5 F (36.9 C)   Resp 16   Ht 6\' 3"  (1.905 m)   Wt 244 lb 9.6 oz (110.9 kg)   SpO2 97%   BMI 30.57 kg/m    Physical Exam Vitals reviewed.  Constitutional:      General: [name] is not in acute distress.    Appearance: Normal appearance. [name] is well-developed. [name] is obese. [name] is not diaphoretic.  HENT:  Head: Normocephalic and atraumatic.  Neck:     Thyroid: No thyromegaly.     Vascular: No JVD.     Trachea: No tracheal deviation.  Cardiovascular:     Rate and Rhythm: Normal rate and regular rhythm.     Heart sounds: Normal heart sounds. No murmur heard. No friction rub. No gallop.   Pulmonary:     Effort: Pulmonary effort is normal. No respiratory distress.     Breath sounds: No wheezing or rales.  Chest:     Chest wall: No tenderness.  Musculoskeletal:     Cervical back: Normal range of motion and neck supple.  Lymphadenopathy:     Cervical: No cervical adenopathy.  Skin:    General: Skin is warm and dry.     Capillary Refill: Capillary refill takes less than 2 seconds.  Neurological:     Mental Status: [name] is alert and oriented to person, place, and time.     Cranial Nerves: No cranial nerve deficit.  Psychiatric:        Mood and Affect: Mood normal.        Behavior: Behavior normal.        Thought Content: Thought content normal.        Judgment: Judgment normal.        Assessment/Plan:  1. Type 2 diabetes  mellitus with hyperglycemia, without long-term current use of insulin (HCC) -His A1c in February 2022 was 6.6.  His A1c was rechecked today and it was 6.1.  This was a decrease by 0.5 - POCT HgB A1C  2. Essential hypertension BP and HR well controlled, continue to monitor  3. Left leg cellulitis Completed antibiotic therapy, followed and managed by wound clinic weekly swelling,  Wearing compression stocking  4. Gastroesophageal reflux disease without esophagitis Symptoms controlled with esomeprazole. - esomeprazole (NEXIUM) 40 MG capsule; Take 1 capsule (40 mg total) by mouth daily at 12 noon.  Dispense: 30 capsule; Refill: 3  5. Other fatigue History of low testosterone  6. Hypogonadism in male History of low testosterone and fatigue - Testosterone,Free and Total   General Counseling: domonick sittner understanding of the findings of todays visit and agrees with plan of treatment. I have discussed any further diagnostic evaluation that may be needed or ordered today. We also reviewed his medications today. [name] has been encouraged to call the office with any questions or concerns that should arise related to todays visit.    Orders Placed This Encounter  Procedures  . POCT HgB A1C    No orders of the defined types were placed in this encounter.  Return in about 3 months (around 12/26/2020) for F/U, Recheck A1C, Yarissa Reining PCP.  Total time spent:30 Minutes Time spent includes review of chart, medications, test results, and follow up plan with the patient.   [place] Controlled Substance Database was reviewed by me.  This patient was seen by [name], FNP-C in collaboration with Dr. Clayborn Bigness as a part of collaborative care agreement.  Dr Lavera Guise Internal medicine

## 2020-09-25 NOTE — Telephone Encounter (Signed)
Patient's daughter wants call back to ask questions about her Dad/s medication.  Please call to advise.

## 2020-09-26 NOTE — Telephone Encounter (Signed)
Returned daughter's call. She asked about the color of a pill her dad is taking. Informed her to call the pharmacy to ask that question. She verbalized understanding.

## 2020-09-28 ENCOUNTER — Other Ambulatory Visit: Payer: Self-pay | Admitting: Internal Medicine

## 2020-10-16 DIAGNOSIS — I495 Sick sinus syndrome: Secondary | ICD-10-CM | POA: Diagnosis not present

## 2020-10-22 ENCOUNTER — Other Ambulatory Visit: Payer: Self-pay | Admitting: Internal Medicine

## 2020-10-31 ENCOUNTER — Other Ambulatory Visit: Payer: Self-pay | Admitting: Internal Medicine

## 2020-11-27 ENCOUNTER — Other Ambulatory Visit: Payer: Self-pay | Admitting: Internal Medicine

## 2020-12-10 ENCOUNTER — Other Ambulatory Visit: Payer: Self-pay | Admitting: Gastroenterology

## 2020-12-21 ENCOUNTER — Other Ambulatory Visit: Payer: Self-pay | Admitting: Nurse Practitioner

## 2020-12-21 DIAGNOSIS — K219 Gastro-esophageal reflux disease without esophagitis: Secondary | ICD-10-CM

## 2020-12-26 ENCOUNTER — Ambulatory Visit: Payer: Medicare HMO | Admitting: Nurse Practitioner

## 2020-12-26 DIAGNOSIS — Z0289 Encounter for other administrative examinations: Secondary | ICD-10-CM

## 2021-01-17 ENCOUNTER — Other Ambulatory Visit: Payer: Self-pay | Admitting: Internal Medicine

## 2021-02-08 ENCOUNTER — Other Ambulatory Visit: Payer: Self-pay

## 2021-02-08 ENCOUNTER — Other Ambulatory Visit: Payer: Self-pay | Admitting: Nurse Practitioner

## 2021-02-08 DIAGNOSIS — M109 Gout, unspecified: Secondary | ICD-10-CM

## 2021-02-08 MED ORDER — COLCHICINE 0.6 MG PO TABS: ORAL_TABLET | ORAL | 0 refills | Status: DC

## 2021-02-08 NOTE — Telephone Encounter (Signed)
Med changed

## 2021-02-08 NOTE — Telephone Encounter (Signed)
Pt called that need refills for colchicine due to gout flare up as per alyssa send also make him appt on Monday

## 2021-02-11 ENCOUNTER — Ambulatory Visit (INDEPENDENT_AMBULATORY_CARE_PROVIDER_SITE_OTHER): Payer: Medicare HMO | Admitting: Nurse Practitioner

## 2021-02-11 ENCOUNTER — Other Ambulatory Visit: Payer: Self-pay

## 2021-02-11 ENCOUNTER — Encounter: Payer: Self-pay | Admitting: Nurse Practitioner

## 2021-02-11 VITALS — BP 140/78 | HR 78 | Temp 98.6°F | Resp 16 | Ht 75.5 in | Wt 246.4 lb

## 2021-02-11 DIAGNOSIS — I10 Essential (primary) hypertension: Secondary | ICD-10-CM

## 2021-02-11 DIAGNOSIS — E1165 Type 2 diabetes mellitus with hyperglycemia: Secondary | ICD-10-CM

## 2021-02-11 DIAGNOSIS — M1A0210 Idiopathic chronic gout, right elbow, without tophus (tophi): Secondary | ICD-10-CM

## 2021-02-11 DIAGNOSIS — N400 Enlarged prostate without lower urinary tract symptoms: Secondary | ICD-10-CM

## 2021-02-11 DIAGNOSIS — K219 Gastro-esophageal reflux disease without esophagitis: Secondary | ICD-10-CM

## 2021-02-11 LAB — POCT GLYCOSYLATED HEMOGLOBIN (HGB A1C): Hemoglobin A1C: 6.8 % — AB (ref 4.0–5.6)

## 2021-02-11 MED ORDER — ALLOPURINOL 300 MG PO TABS: 300.0000 mg | ORAL_TABLET | Freq: Every day | ORAL | 1 refills | Status: DC

## 2021-02-11 MED ORDER — ESOMEPRAZOLE MAGNESIUM 40 MG PO CPDR: 40.0000 mg | DELAYED_RELEASE_CAPSULE | Freq: Every day | ORAL | 1 refills | Status: DC

## 2021-02-11 MED ORDER — METOPROLOL SUCCINATE ER 50 MG PO TB24: 50.0000 mg | ORAL_TABLET | Freq: Every day | ORAL | 1 refills | Status: DC

## 2021-02-11 MED ORDER — COLCHICINE 0.6 MG PO CAPS: ORAL_CAPSULE | ORAL | 2 refills | Status: DC

## 2021-02-11 MED ORDER — LOSARTAN POTASSIUM-HCTZ 100-12.5 MG PO TABS: 1.0000 | ORAL_TABLET | Freq: Every day | ORAL | 1 refills | Status: DC

## 2021-02-11 MED ORDER — METFORMIN HCL 500 MG PO TABS: 500.0000 mg | ORAL_TABLET | Freq: Two times a day (BID) | ORAL | 1 refills | Status: DC

## 2021-02-11 MED ORDER — DILTIAZEM HCL ER COATED BEADS 240 MG PO CP24: 240.0000 mg | ORAL_CAPSULE | Freq: Every day | ORAL | 3 refills | Status: DC

## 2021-02-11 MED ORDER — ALLOPURINOL 300 MG PO TABS: 300.0000 mg | ORAL_TABLET | Freq: Every day | ORAL | 0 refills | Status: DC

## 2021-02-11 MED ORDER — TAMSULOSIN HCL 0.4 MG PO CAPS: 0.4000 mg | ORAL_CAPSULE | Freq: Every day | ORAL | 1 refills | Status: DC

## 2021-02-11 NOTE — Progress Notes (Signed)
[facility] [place], [place] [address]  Internal MEDICINE  Office Visit Note  Patient Name: [name]  [id number]  [id number]  Date of Service: 02/11/2021  Chief Complaint  Patient presents with   Diabetes    A1C, 3 month follow up    HPI Oney presents for a follow up visit for diabetes and medication refills. His A1c is 6.8. It is up from 6.1 in may 2022. He reports just getting back from vacation and having eaten a lot of cake recently and that he just had some cake earlier today.     Current Medication: Outpatient Encounter Medications as of 02/11/2021  Medication Sig   Accu-Chek Softclix Lancets lancets Use as instructed twice a day DX E11.65   aspirin EC 81 MG tablet Take 81 mg by mouth daily. Swallow whole.   glucose blood (ACCU-CHEK GUIDE) test strip Use as directed  twice a day Dx E11.65   potassium chloride SA (KLOR-CON) 20 MEQ tablet Take 1 tablet by mouth daily.   simvastatin (ZOCOR) 10 MG tablet TAKE 1 TABLET BY MOUTH EVERY DAY   [DISCONTINUED] allopurinol (ZYLOPRIM) 300 MG tablet TAKE 1 TABLET BY MOUTH AT BEDTIME FOR GOUT   [DISCONTINUED] Colchicine (MITIGARE) 0.6 MG CAPS Take 0.6 mg by mouth daily as needed (gout flare).   [DISCONTINUED] diltiazem (CARTIA XT) 240 MG 24 hr capsule Take 1 capsule (240 mg total) by mouth daily.   [DISCONTINUED] esomeprazole (NEXIUM) 40 MG capsule TAKE 1 CAPSULE (40 MG TOTAL) BY MOUTH DAILY AT 12 NOON.   [DISCONTINUED] losartan-hydrochlorothiazide (HYZAAR) 100-12.5 MG tablet TAKE 1 TABLET BY MOUTH EVERY DAY   [DISCONTINUED] metFORMIN (GLUCOPHAGE) 500 MG tablet Take 1 tablet (500 mg total) by mouth 2 (two) times daily with a meal.   [DISCONTINUED] metoprolol succinate (TOPROL-XL) 50 MG 24 hr tablet Take 50 mg by mouth daily.   [DISCONTINUED] tamsulosin (FLOMAX) 0.4 MG CAPS capsule TAKE 1 CAPSULE BY MOUTH EVERY DAY   allopurinol (ZYLOPRIM) 300 MG tablet Take 1 tablet (300 mg total) by mouth daily.    Colchicine (MITIGARE) 0.6 MG CAPS For acute gout attack, take 2 tablets now then take 1 more tablet 1 hour later on the first day. Then take 1 tablet daily until flare up resolves.   diltiazem (CARTIA XT) 240 MG 24 hr capsule Take 1 capsule (240 mg total) by mouth daily.   esomeprazole (NEXIUM) 40 MG capsule Take 1 capsule (40 mg total) by mouth daily at 12 noon.   losartan-hydrochlorothiazide (HYZAAR) 100-12.5 MG tablet Take 1 tablet by mouth daily.   [START ON 02/27/2021] metFORMIN (GLUCOPHAGE) 500 MG tablet Take 1 tablet (500 mg total) by mouth 2 (two) times daily with a meal.   metoprolol succinate (TOPROL-XL) 50 MG 24 hr tablet Take 1 tablet (50 mg total) by mouth daily.   tamsulosin (FLOMAX) 0.4 MG CAPS capsule Take 1 capsule (0.4 mg total) by mouth daily.   [DISCONTINUED] allopurinol (ZYLOPRIM) 300 MG tablet Take 1 tablet (300 mg total) by mouth daily.   No facility-administered encounter medications on file as of 02/11/2021.    Surgical History: Past Surgical History:  Procedure Laterality Date   CARDIAC CATHETERIZATION     COLONOSCOPY WITH PROPOFOL N/A 01/24/2020   Procedure: COLONOSCOPY WITH PROPOFOL;  Surgeon: [name], MD;  Location: [facility] ENDOSCOPY;  Service: Gastroenterology;  Laterality: N/A;   CORONARY/GRAFT ACUTE MI REVASCULARIZATION N/A 05/02/2018   Procedure: Coronary/Graft Acute MI Revascularization;  Surgeon: [name], MD;  Location: [place] [facility];  Service: Cardiovascular;  Laterality: N/A;   HERNIA REPAIR     LEFT HEART CATH AND CORONARY ANGIOGRAPHY N/A 05/02/2018   Procedure: LEFT HEART CATH AND CORONARY ANGIOGRAPHY;  Surgeon: [name], MD;  Location: [place] [facility];  Service: Cardiovascular;  Laterality: N/A;   PACEMAKER INSERTION Left 05/03/2018   Procedure: INSERTION PACEMAKER;  Surgeon: [name], MD;  Location: [facility] ORS;  Service: Cardiovascular;  Laterality: Left;   TEMPORARY PACEMAKER N/A 05/02/2018   Procedure:  TEMPORARY PACEMAKER;  Surgeon: [name], MD;  Location: [place] [facility];  Service: Cardiovascular;  Laterality: N/A;   TEMPORARY PACEMAKER Right 05/03/2018   Procedure: TEMPORARY PACEMAKER REMOVAL;  Surgeon: [name], MD;  Location: [facility] ORS;  Service: Cardiovascular;  Laterality: Right;   TONSILLECTOMY Bilateral    1992    Medical History: Past Medical History:  Diagnosis Date   Diabetes mellitus without complication ([place])    GERD (gastroesophageal reflux disease)    Hyperlipidemia    Hypertension     Family History: Family History  Problem Relation Age of Onset   Cirrhosis Father    Bone cancer Mother     Social History   Socioeconomic History   Marital status: Married    Spouse name: Not on file   Number of children: Not on file   Years of education: Not on file   Highest education level: Not on file  Occupational History   Not on file  Tobacco Use   Smoking status: Former    Types: Cigarettes   Smokeless tobacco: Never  Vaping Use   Vaping Use: Never used  Substance and Sexual Activity   Alcohol use: No   Drug use: No   Sexual activity: Not on file  Other Topics Concern   Not on file  Social History Narrative   Not on file   Social Determinants of Health   Financial Resource Strain: Not on file  Food Insecurity: Not on file  Transportation Needs: Not on file  Physical Activity: Not on file  Stress: Not on file  Social Connections: Not on file  Intimate Partner Violence: Not on file      Review of Systems  Constitutional:  Negative for chills, fatigue and unexpected weight change.  HENT:  Negative for congestion, rhinorrhea, sneezing and sore throat.   Eyes:  Negative for redness.  Respiratory:  Negative for cough, chest tightness and shortness of breath.   Cardiovascular:  Negative for chest pain and palpitations.  Gastrointestinal:  Negative for abdominal pain, constipation, diarrhea, nausea and vomiting.   Genitourinary:  Negative for dysuria and frequency.  Musculoskeletal:  Negative for arthralgias, back pain, joint swelling and neck pain.  Skin:  Negative for rash.  Neurological: Negative.  Negative for tremors and numbness.  Hematological:  Negative for adenopathy. Does not bruise/bleed easily.  Psychiatric/Behavioral:  Negative for behavioral problems (Depression), sleep disturbance and suicidal ideas. The patient is not nervous/anxious.    Vital Signs: BP 140/78   Pulse 78   Temp 98.6 F (37 C)   Resp 16   Ht 6' 3.5" (1.918 m)   Wt 246 lb 6.4 oz (111.8 kg)   SpO2 98%   BMI 30.39 kg/m    Physical Exam Vitals reviewed.  Constitutional:      General: He is not in acute distress.    Appearance: Normal appearance. He is obese. He is not ill-appearing.  HENT:     Head: Normocephalic and  atraumatic.  Eyes:     Extraocular Movements: Extraocular movements intact.     Pupils: Pupils are equal, round, and reactive to light.  Cardiovascular:     Rate and Rhythm: Normal rate and regular rhythm.  Pulmonary:     Effort: Pulmonary effort is normal. No respiratory distress.  Neurological:     Mental Status: He is alert and oriented to person, place, and time.     Cranial Nerves: No cranial nerve deficit.     Coordination: Coordination normal.     Gait: Gait normal.  Psychiatric:        Mood and Affect: Mood normal.        Behavior: Behavior normal.       Assessment/Plan: 1. Type 2 diabetes mellitus with hyperglycemia, without long-term current use of insulin (HCC) A1c is 6.8, continue metformin as prescribed, patient states he is going to quit eating cake. Follow up in 3 months to repeat A1C - POCT glycosylated hemoglobin (Hb A1C) - metFORMIN (GLUCOPHAGE) 500 MG tablet; Take 1 tablet (500 mg total) by mouth 2 (two) times daily with a meal.  Dispense: 180 tablet; Refill: 1  2. Essential hypertension Stable, refills ordered - losartan-hydrochlorothiazide (HYZAAR) 100-12.5  MG tablet; Take 1 tablet by mouth daily.  Dispense: 90 tablet; Refill: 1 - diltiazem (CARTIA XT) 240 MG 24 hr capsule; Take 1 capsule (240 mg total) by mouth daily.  Dispense: 30 capsule; Refill: 3 - metoprolol succinate (TOPROL-XL) 50 MG 24 hr tablet; Take 1 tablet (50 mg total) by mouth daily.  Dispense: 90 tablet; Refill: 1  3. Idiopathic chronic gout of right elbow without tophus Colchicine prescription sent for acute gout attack, allopurinol refill ordered - Colchicine (MITIGARE) 0.6 MG CAPS; For acute gout attack, take 2 tablets now then take 1 more tablet 1 hour later on the first day. Then take 1 tablet daily until flare up resolves.  Dispense: 60 capsule; Refill: 2 - allopurinol (ZYLOPRIM) 300 MG tablet; Take 1 tablet (300 mg total) by mouth daily.  Dispense: 90 tablet; Refill: 1  4. Gastroesophageal reflux disease without esophagitis Stable, refills ordered - esomeprazole (NEXIUM) 40 MG capsule; Take 1 capsule (40 mg total) by mouth daily at 12 noon.  Dispense: 90 capsule; Refill: 1  5. Enlarged prostate Tamsulosin refill ordered - tamsulosin (FLOMAX) 0.4 MG CAPS capsule; Take 1 capsule (0.4 mg total) by mouth daily.  Dispense: 90 capsule; Refill: 1   General Counseling: Zavior verbalizes understanding of the findings of todays visit and agrees with plan of treatment. I have discussed any further diagnostic evaluation that may be needed or ordered today. We also reviewed his medications today. he has been encouraged to call the office with any questions or concerns that should arise related to todays visit.    Orders Placed This Encounter  Procedures   POCT glycosylated hemoglobin (Hb A1C)    Meds ordered this encounter  Medications   DISCONTD: allopurinol (ZYLOPRIM) 300 MG tablet    Sig: Take 1 tablet (300 mg total) by mouth daily.    Dispense:  90 tablet    Refill:  0   Colchicine (MITIGARE) 0.6 MG CAPS    Sig: For acute gout attack, take 2 tablets now then take 1 more  tablet 1 hour later on the first day. Then take 1 tablet daily until flare up resolves.    Dispense:  60 capsule    Refill:  2   tamsulosin (FLOMAX) 0.4 MG CAPS capsule    Sig:  Take 1 capsule (0.4 mg total) by mouth daily.    Dispense:  90 capsule    Refill:  1   losartan-hydrochlorothiazide (HYZAAR) 100-12.5 MG tablet    Sig: Take 1 tablet by mouth daily.    Dispense:  90 tablet    Refill:  1   diltiazem (CARTIA XT) 240 MG 24 hr capsule    Sig: Take 1 capsule (240 mg total) by mouth daily.    Dispense:  30 capsule    Refill:  3    Per Dr. Clayborn Bigness   allopurinol (ZYLOPRIM) 300 MG tablet    Sig: Take 1 tablet (300 mg total) by mouth daily.    Dispense:  90 tablet    Refill:  1   esomeprazole (NEXIUM) 40 MG capsule    Sig: Take 1 capsule (40 mg total) by mouth daily at 12 noon.    Dispense:  90 capsule    Refill:  1   metoprolol succinate (TOPROL-XL) 50 MG 24 hr tablet    Sig: Take 1 tablet (50 mg total) by mouth daily.    Dispense:  90 tablet    Refill:  1   metFORMIN (GLUCOPHAGE) 500 MG tablet    Sig: Take 1 tablet (500 mg total) by mouth 2 (two) times daily with a meal.    Dispense:  180 tablet    Refill:  1     Return in about 3 months (around 05/14/2021) for F/U, Recheck A1C, Leidi Astle PCP.   Total time spent:30 Minutes Time spent includes review of chart, medications, test results, and follow up plan with the patient.   [place] Controlled Substance Database was reviewed by me.  This patient was seen by [name], FNP-C in collaboration with Dr. Clayborn Bigness as a part of collaborative care agreement.   Robbin Escher R. Valetta Fuller, MSN, FNP-C Internal medicine

## 2021-02-25 DIAGNOSIS — I2510 Atherosclerotic heart disease of native coronary artery without angina pectoris: Secondary | ICD-10-CM | POA: Diagnosis not present

## 2021-02-25 DIAGNOSIS — I10 Essential (primary) hypertension: Secondary | ICD-10-CM | POA: Diagnosis not present

## 2021-02-25 DIAGNOSIS — E6609 Other obesity due to excess calories: Secondary | ICD-10-CM | POA: Diagnosis not present

## 2021-02-25 DIAGNOSIS — E119 Type 2 diabetes mellitus without complications: Secondary | ICD-10-CM | POA: Diagnosis not present

## 2021-02-25 DIAGNOSIS — I208 Other forms of angina pectoris: Secondary | ICD-10-CM | POA: Diagnosis not present

## 2021-02-25 DIAGNOSIS — R001 Bradycardia, unspecified: Secondary | ICD-10-CM | POA: Diagnosis not present

## 2021-02-25 DIAGNOSIS — I495 Sick sinus syndrome: Secondary | ICD-10-CM | POA: Diagnosis not present

## 2021-02-25 DIAGNOSIS — Z6831 Body mass index (BMI) 31.0-31.9, adult: Secondary | ICD-10-CM | POA: Diagnosis not present

## 2021-02-25 DIAGNOSIS — Z950 Presence of cardiac pacemaker: Secondary | ICD-10-CM | POA: Diagnosis not present

## 2021-03-12 DIAGNOSIS — I495 Sick sinus syndrome: Secondary | ICD-10-CM | POA: Diagnosis not present

## 2021-03-20 DIAGNOSIS — I495 Sick sinus syndrome: Secondary | ICD-10-CM | POA: Diagnosis not present

## 2021-03-27 DIAGNOSIS — I208 Other forms of angina pectoris: Secondary | ICD-10-CM | POA: Diagnosis not present

## 2021-03-27 DIAGNOSIS — Z6831 Body mass index (BMI) 31.0-31.9, adult: Secondary | ICD-10-CM | POA: Diagnosis not present

## 2021-03-27 DIAGNOSIS — Z950 Presence of cardiac pacemaker: Secondary | ICD-10-CM | POA: Diagnosis not present

## 2021-03-27 DIAGNOSIS — I495 Sick sinus syndrome: Secondary | ICD-10-CM | POA: Diagnosis not present

## 2021-03-27 DIAGNOSIS — E6609 Other obesity due to excess calories: Secondary | ICD-10-CM | POA: Diagnosis not present

## 2021-03-27 DIAGNOSIS — I10 Essential (primary) hypertension: Secondary | ICD-10-CM | POA: Diagnosis not present

## 2021-03-27 DIAGNOSIS — I2510 Atherosclerotic heart disease of native coronary artery without angina pectoris: Secondary | ICD-10-CM | POA: Diagnosis not present

## 2021-03-27 DIAGNOSIS — E119 Type 2 diabetes mellitus without complications: Secondary | ICD-10-CM | POA: Diagnosis not present

## 2021-03-27 DIAGNOSIS — R001 Bradycardia, unspecified: Secondary | ICD-10-CM | POA: Diagnosis not present

## 2021-04-11 DIAGNOSIS — I208 Other forms of angina pectoris: Secondary | ICD-10-CM | POA: Diagnosis not present

## 2021-04-11 DIAGNOSIS — I10 Essential (primary) hypertension: Secondary | ICD-10-CM | POA: Diagnosis not present

## 2021-04-11 DIAGNOSIS — I509 Heart failure, unspecified: Secondary | ICD-10-CM | POA: Diagnosis not present

## 2021-04-11 DIAGNOSIS — E6609 Other obesity due to excess calories: Secondary | ICD-10-CM | POA: Diagnosis not present

## 2021-04-11 DIAGNOSIS — E119 Type 2 diabetes mellitus without complications: Secondary | ICD-10-CM | POA: Diagnosis not present

## 2021-04-11 DIAGNOSIS — R600 Localized edema: Secondary | ICD-10-CM | POA: Diagnosis not present

## 2021-04-11 DIAGNOSIS — I495 Sick sinus syndrome: Secondary | ICD-10-CM | POA: Diagnosis not present

## 2021-04-11 DIAGNOSIS — R001 Bradycardia, unspecified: Secondary | ICD-10-CM | POA: Diagnosis not present

## 2021-04-11 DIAGNOSIS — I2510 Atherosclerotic heart disease of native coronary artery without angina pectoris: Secondary | ICD-10-CM | POA: Diagnosis not present

## 2021-04-23 IMAGING — CT CT TIBIA FIBULA *L* W/ CM
2 of 3 series · 11 of 33 positions shown, 13 images · IV contrast (omnipaque)
Comparison: None.

CLINICAL DATA: Diffuse swelling, question of osteomyelitis

EXAM:
CT OF THE LOWER RIGHT EXTREMITY WITH CONTRAST
TECHNIQUE: Multidetector CT imaging of the lower right extremity was performed
according to the standard protocol following intravenous contrast
administration.
CONTRAST:  100mL OMNIPAQUE IOHEXOL 300 MG/ML  SOLN

[Series 6: axial st · axial · 0.38mm/px · z∈[-668,-187]mm · 8 of 381 slices shown, 10 images]
[im 30/381  soft-tissue]
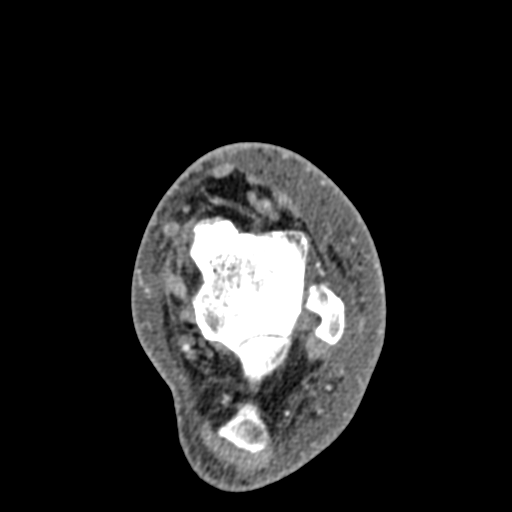
[im 30/381  bone]
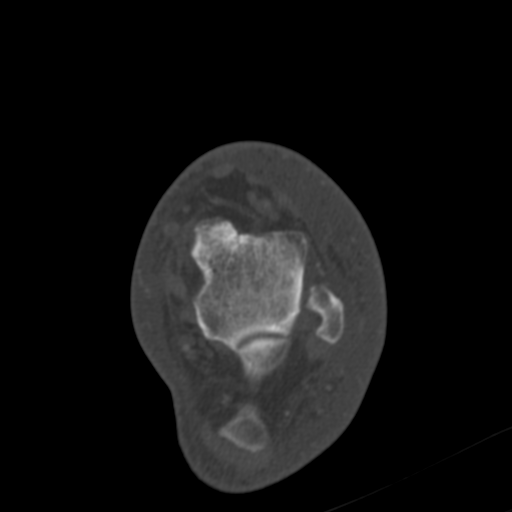
[im 88/381  bone]
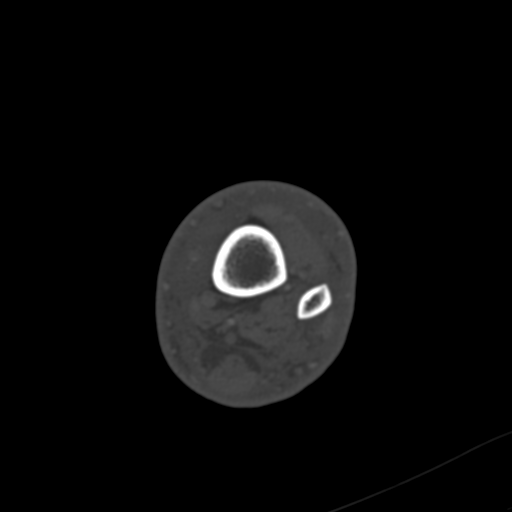
[im 117/381  bone]
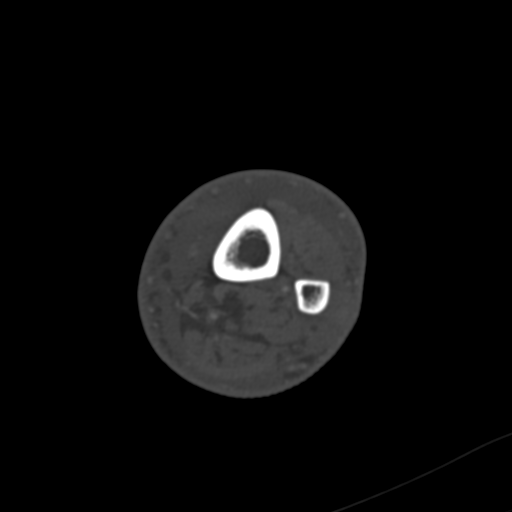
[im 176/381  bone]
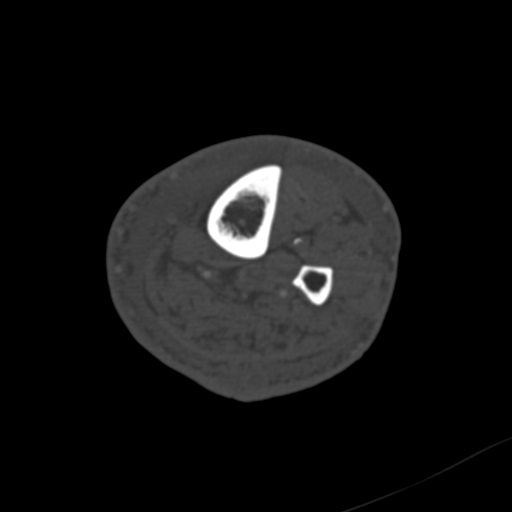
[im 205/381  soft-tissue]
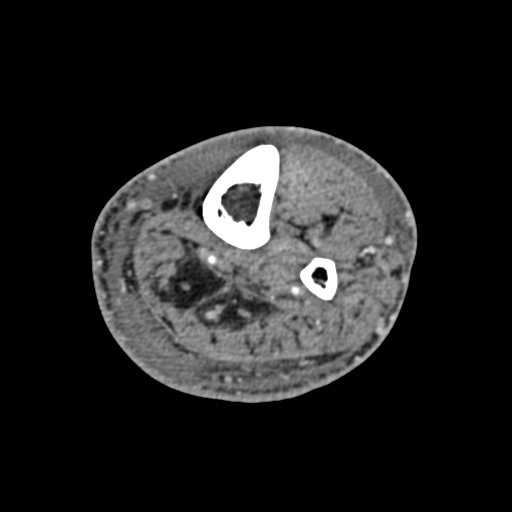
[im 205/381  bone]
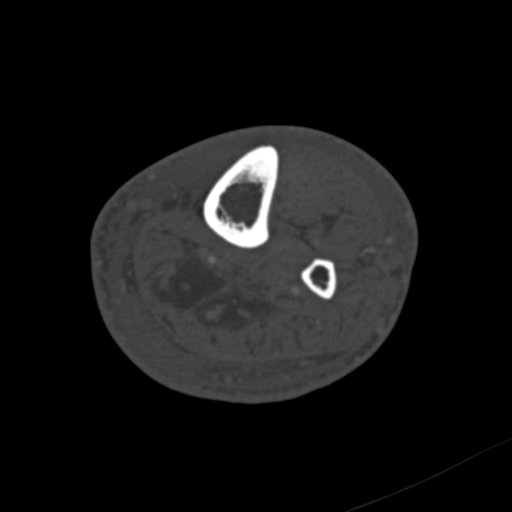
[im 264/381  bone]
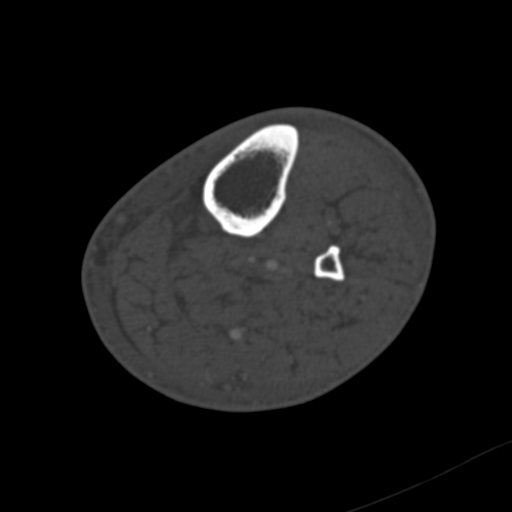
[im 293/381  bone]
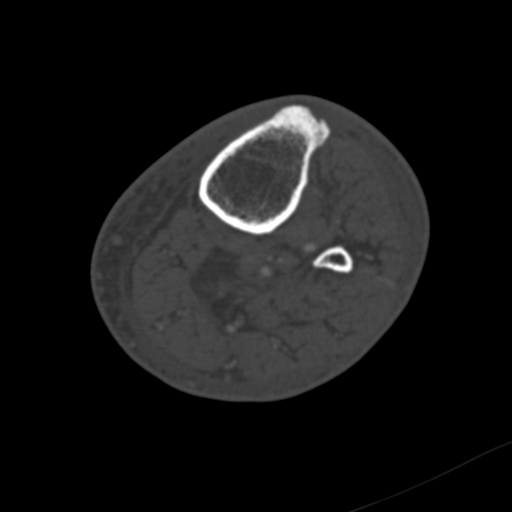
[im 351/381  bone]
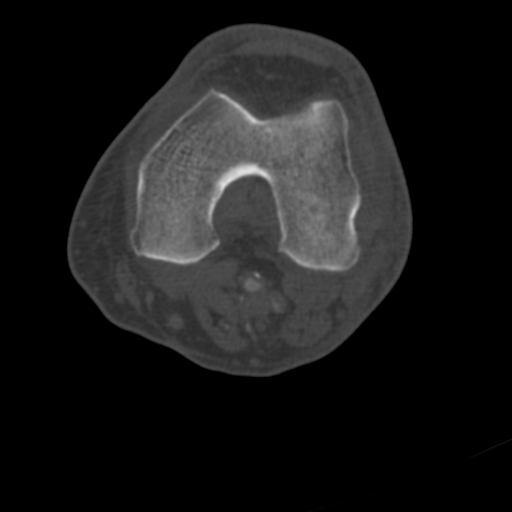

[Series 8: coronal st · coronal · 0.35mm/px · 3 of 102 slices shown]
[im 21/102  bone]
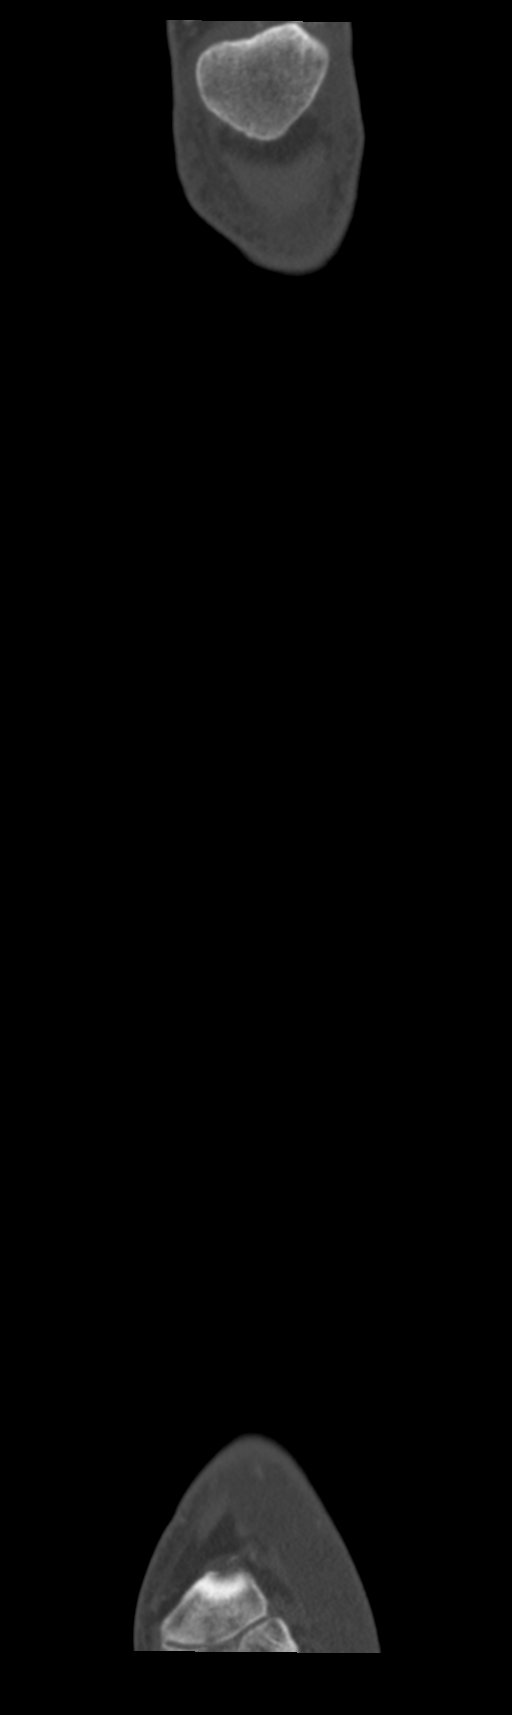
[im 41/102  bone]
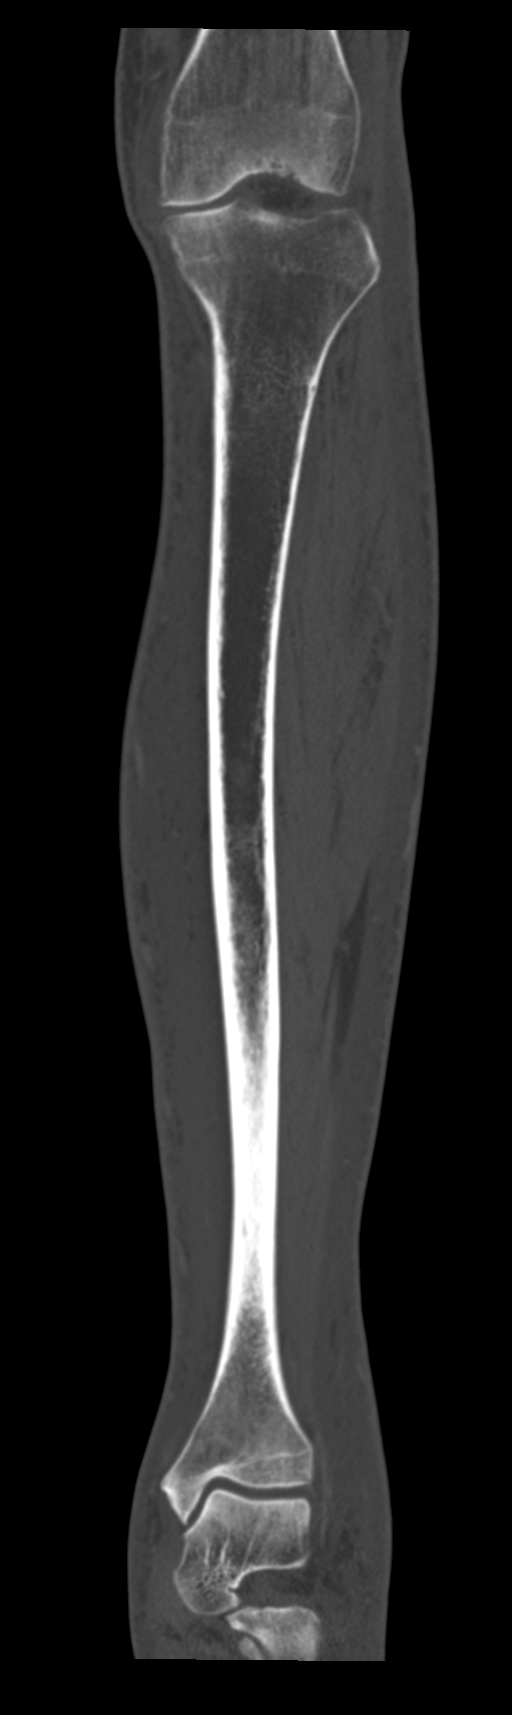
[im 61/102  bone]
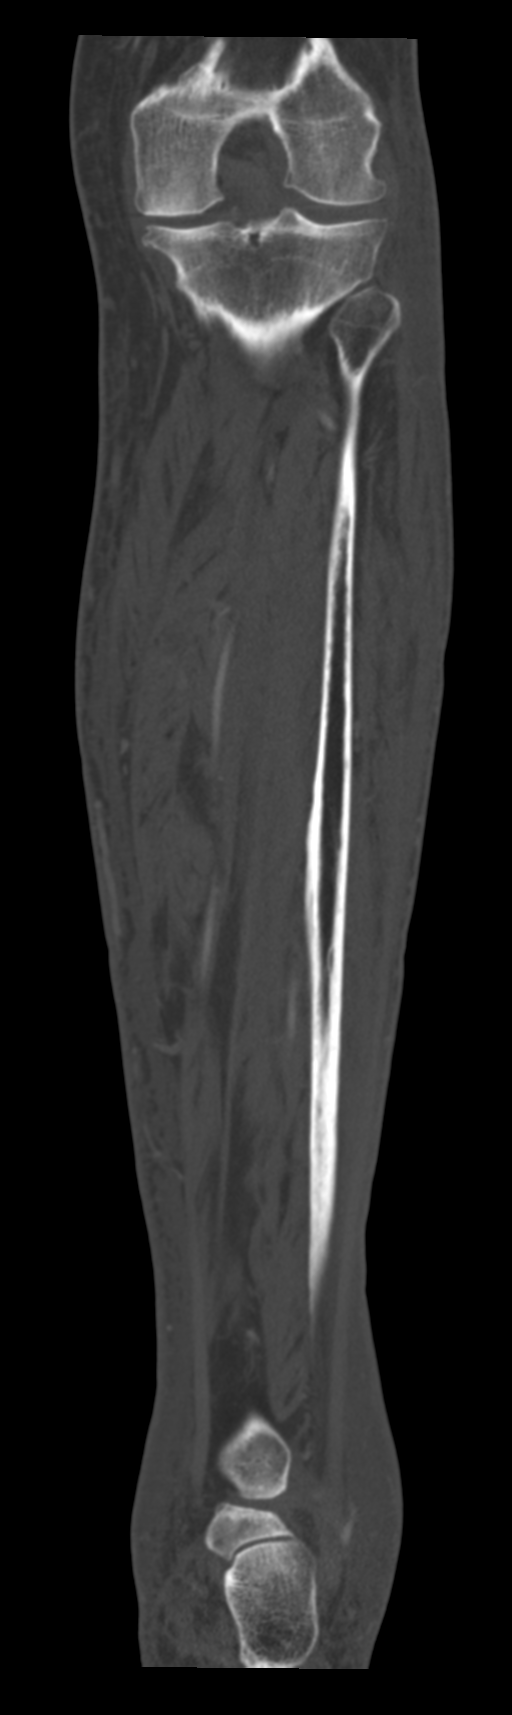

[11 of 33 positions shown; findings below may reference images not displayed]

FINDINGS: Bones/Joint/Cartilage

No fracture or dislocation. No areas of cortical destruction or
periosteal reaction. There is mild to moderate medial compartment
osteoarthritis with joint space loss. The tibiotalar joint appears
to be intact.

Ligaments

Suboptimally assessed by CT.

Muscles and Tendons

The muscles surrounding the lower extremity appear to be intact
without focal atrophy or tear. The visualized portions of the
tendons are intact.

Soft tissues

There is diffuse subcutaneous edema and skin thickening seen
surrounding the lower extremity. No loculated fluid collections are
noted.
IMPRESSION: Findings which may be suggestive of diffuse cellulitis. No evidence
of osteomyelitis or loculated fluid collections.

## 2021-04-24 DIAGNOSIS — H524 Presbyopia: Secondary | ICD-10-CM | POA: Diagnosis not present

## 2021-04-24 DIAGNOSIS — Z0100 Encounter for examination of eyes and vision without abnormal findings: Secondary | ICD-10-CM | POA: Diagnosis not present

## 2021-05-08 DIAGNOSIS — R600 Localized edema: Secondary | ICD-10-CM | POA: Diagnosis not present

## 2021-05-14 ENCOUNTER — Ambulatory Visit: Payer: Medicare PPO | Admitting: Nurse Practitioner

## 2021-05-14 DIAGNOSIS — Z0289 Encounter for other administrative examinations: Secondary | ICD-10-CM

## 2021-05-27 ENCOUNTER — Ambulatory Visit (INDEPENDENT_AMBULATORY_CARE_PROVIDER_SITE_OTHER): Payer: Medicare PPO | Admitting: Nurse Practitioner

## 2021-05-27 ENCOUNTER — Other Ambulatory Visit: Payer: Self-pay

## 2021-05-27 ENCOUNTER — Encounter: Payer: Self-pay | Admitting: Nurse Practitioner

## 2021-05-27 VITALS — BP 140/82 | HR 67 | Temp 98.0°F | Resp 16 | Ht 75.5 in | Wt 251.0 lb

## 2021-05-27 DIAGNOSIS — E1165 Type 2 diabetes mellitus with hyperglycemia: Secondary | ICD-10-CM | POA: Diagnosis not present

## 2021-05-27 DIAGNOSIS — I10 Essential (primary) hypertension: Secondary | ICD-10-CM | POA: Diagnosis not present

## 2021-05-27 DIAGNOSIS — K219 Gastro-esophageal reflux disease without esophagitis: Secondary | ICD-10-CM

## 2021-05-27 LAB — POCT GLYCOSYLATED HEMOGLOBIN (HGB A1C): Hemoglobin A1C: 6.8 % — AB (ref 4.0–5.6)

## 2021-05-27 MED ORDER — ESOMEPRAZOLE MAGNESIUM 40 MG PO CPDR: 40.0000 mg | DELAYED_RELEASE_CAPSULE | Freq: Every day | ORAL | 1 refills | Status: DC

## 2021-05-27 NOTE — Progress Notes (Signed)
[facility] [place], [place] [address]  Internal MEDICINE  Office Visit Note  Patient Name: [name]  [id number]  [id number]  Date of Service: 05/27/2021  Chief Complaint  Patient presents with   Follow-up   Hyperlipidemia   Hypertension   Diabetes   Gastroesophageal Reflux   Medication Reaction    Blood thinner is making pt feel constantly cold   Medication Refill    Needs more Nexium    HPI Kinsey presents for a follow-up visit for hypertension, diabetes and medication refills.  His A1c was due today and it was 6.8 which is no change from his previous A1c.  He remains stable with his A1c under 7.  His blood pressure is stable and well-controlled.  He does need refills of his Nexium.  He also reports that the blood thinner he is on is making him feel cold all the time.   Current Medication: Outpatient Encounter Medications as of 05/27/2021  Medication Sig   Accu-Chek Softclix Lancets lancets Use as instructed twice a day DX E11.65   allopurinol (ZYLOPRIM) 300 MG tablet Take 1 tablet (300 mg total) by mouth daily.   apixaban (ELIQUIS) 5 MG TABS tablet Take by mouth.   aspirin EC 81 MG tablet Take 81 mg by mouth daily. Swallow whole.   Colchicine (MITIGARE) 0.6 MG CAPS For acute gout attack, take 2 tablets now then take 1 more tablet 1 hour later on the first day. Then take 1 tablet daily until flare up resolves.   furosemide (LASIX) 20 MG tablet Take by mouth.   glucose blood (ACCU-CHEK GUIDE) test strip Use as directed  twice a day Dx E11.65   losartan-hydrochlorothiazide (HYZAAR) 100-12.5 MG tablet Take 1 tablet by mouth daily.   metFORMIN (GLUCOPHAGE) 500 MG tablet Take 1 tablet (500 mg total) by mouth 2 (two) times daily with a meal.   metoprolol succinate (TOPROL-XL) 50 MG 24 hr tablet Take 1 tablet (50 mg total) by mouth daily.   potassium chloride SA (KLOR-CON) 20 MEQ tablet Take 1 tablet by mouth daily.   tamsulosin (FLOMAX) 0.4 MG CAPS  capsule Take 1 capsule (0.4 mg total) by mouth daily.   [DISCONTINUED] diltiazem (CARTIA XT) 240 MG 24 hr capsule Take 1 capsule (240 mg total) by mouth daily.   [DISCONTINUED] diltiazem (TIAZAC) 240 MG 24 hr capsule Take by mouth.   [DISCONTINUED] esomeprazole (NEXIUM) 40 MG capsule Take 1 capsule (40 mg total) by mouth daily at 12 noon.   [DISCONTINUED] simvastatin (ZOCOR) 10 MG tablet TAKE 1 TABLET BY MOUTH EVERY DAY   carvedilol (COREG) 25 MG tablet Take 25 mg by mouth 2 (two) times daily.   esomeprazole (NEXIUM) 40 MG capsule Take 1 capsule (40 mg total) by mouth daily at 12 noon.   No facility-administered encounter medications on file as of 05/27/2021.    Surgical History: Past Surgical History:  Procedure Laterality Date   CARDIAC CATHETERIZATION     COLONOSCOPY WITH PROPOFOL N/A 01/24/2020   Procedure: COLONOSCOPY WITH PROPOFOL;  Surgeon: [name], MD;  Location: [facility] ENDOSCOPY;  Service: Gastroenterology;  Laterality: N/A;   CORONARY/GRAFT ACUTE MI REVASCULARIZATION N/A 05/02/2018   Procedure: Coronary/Graft Acute MI Revascularization;  Surgeon: [name], MD;  Location: [place];  Service: Cardiovascular;  Laterality: N/A;   HERNIA REPAIR     LEFT HEART CATH AND CORONARY ANGIOGRAPHY N/A 05/02/2018   Procedure: LEFT HEART CATH AND CORONARY ANGIOGRAPHY;  Surgeon: Lujean Amel  D, MD;  Location: [place] [facility] LAB;  Service: Cardiovascular;  Laterality: N/A;   PACEMAKER INSERTION Left 05/03/2018   Procedure: INSERTION PACEMAKER;  Surgeon: [name], MD;  Location: [facility] ORS;  Service: Cardiovascular;  Laterality: Left;   TEMPORARY PACEMAKER N/A 05/02/2018   Procedure: TEMPORARY PACEMAKER;  Surgeon: [name], MD;  Location: [place] [facility];  Service: Cardiovascular;  Laterality: N/A;   TEMPORARY PACEMAKER Right 05/03/2018   Procedure: TEMPORARY PACEMAKER REMOVAL;  Surgeon: [name], MD;  Location: [facility] ORS;  Service:  Cardiovascular;  Laterality: Right;   TONSILLECTOMY Bilateral    1992    Medical History: Past Medical History:  Diagnosis Date   Diabetes mellitus without complication ([place])    GERD (gastroesophageal reflux disease)    Hyperlipidemia    Hypertension     Family History: Family History  Problem Relation Age of Onset   Cirrhosis Father    Bone cancer Mother     Social History   Socioeconomic History   Marital status: Married    Spouse name: Not on file   Number of children: Not on file   Years of education: Not on file   Highest education level: Not on file  Occupational History   Not on file  Tobacco Use   Smoking status: Former    Types: Cigarettes   Smokeless tobacco: Never  Vaping Use   Vaping Use: Never used  Substance and Sexual Activity   Alcohol use: No   Drug use: No   Sexual activity: Not on file  Other Topics Concern   Not on file  Social History Narrative   Not on file   Social Determinants of Health   Financial Resource Strain: Not on file  Food Insecurity: Not on file  Transportation Needs: Not on file  Physical Activity: Not on file  Stress: Not on file  Social Connections: Not on file  Intimate Partner Violence: Not on file      Review of Systems  Constitutional:  Negative for chills, fatigue and unexpected weight change.  HENT:  Negative for congestion, rhinorrhea, sneezing and sore throat.   Eyes:  Negative for redness.  Respiratory:  Negative for cough, chest tightness and shortness of breath.   Cardiovascular:  Negative for chest pain and palpitations.  Gastrointestinal:  Negative for abdominal pain, constipation, diarrhea, nausea and vomiting.  Genitourinary:  Negative for dysuria and frequency.  Musculoskeletal:  Negative for arthralgias, back pain, joint swelling and neck pain.  Skin:  Negative for rash.  Neurological: Negative.  Negative for tremors and numbness.  Hematological:  Negative for adenopathy. Does not bruise/bleed  easily.  Psychiatric/Behavioral:  Negative for behavioral problems (Depression), sleep disturbance and suicidal ideas. The patient is not nervous/anxious.    Vital Signs: BP 140/82 Comment: 165/96   Pulse 67    Temp 98 F (36.7 C)    Resp 16    Ht 6' 3.5" (1.918 m)    Wt 251 lb (113.9 kg)    SpO2 100%    BMI 30.96 kg/m    Physical Exam Vitals reviewed.  Constitutional:      General: He is not in acute distress.    Appearance: Normal appearance. He is obese. He is not ill-appearing.  HENT:     Head: Normocephalic and atraumatic.  Eyes:     Pupils: Pupils are equal, round, and reactive to light.  Cardiovascular:     Rate and Rhythm: Normal rate and regular rhythm.  Pulmonary:  Effort: Pulmonary effort is normal. No respiratory distress.  Neurological:     Mental Status: He is alert and oriented to person, place, and time.     Cranial Nerves: No cranial nerve deficit.     Coordination: Coordination normal.     Gait: Gait normal.  Psychiatric:        Mood and Affect: Mood normal.        Behavior: Behavior normal.       Assessment/Plan: 1. Type 2 diabetes mellitus with hyperglycemia, without long-term current use of insulin (HCC) A1c is stable, no change, was 6.8 today. No change in medications. Continue previously discussed diet modifications and taking metformin twice a day with meals.  - POCT HgB A1C  2. Essential hypertension Stable with current medications. Continue as prescribed.   3. Gastroesophageal reflux disease without esophagitis Stable, refills ordered.  - esomeprazole (NEXIUM) 40 MG capsule; Take 1 capsule (40 mg total) by mouth daily at 12 noon.  Dispense: 90 capsule; Refill: 1   General Counseling: Rohaan verbalizes understanding of the findings of todays visit and agrees with plan of treatment. I have discussed any further diagnostic evaluation that may be needed or ordered today. We also reviewed his medications today. he has been encouraged to call the  office with any questions or concerns that should arise related to todays visit.    Orders Placed This Encounter  Procedures   POCT HgB A1C    Meds ordered this encounter  Medications   esomeprazole (NEXIUM) 40 MG capsule    Sig: Take 1 capsule (40 mg total) by mouth daily at 12 noon.    Dispense:  90 capsule    Refill:  1    Return in about 3 months (around 08/25/2021) for F/U, Recheck A1C, Jamicia Haaland PCP.   Total time spent:30 Minutes Time spent includes review of chart, medications, test results, and follow up plan with the patient.   [place] Controlled Substance Database was reviewed by me.  This patient was seen by [name], FNP-C in collaboration with Dr. Clayborn Bigness as a part of collaborative care agreement.   Suriyah Vergara R. Valetta Fuller, MSN, FNP-C Internal medicine

## 2021-06-03 DIAGNOSIS — I495 Sick sinus syndrome: Secondary | ICD-10-CM | POA: Diagnosis not present

## 2021-06-03 DIAGNOSIS — I2510 Atherosclerotic heart disease of native coronary artery without angina pectoris: Secondary | ICD-10-CM | POA: Diagnosis not present

## 2021-06-03 DIAGNOSIS — E6609 Other obesity due to excess calories: Secondary | ICD-10-CM | POA: Diagnosis not present

## 2021-06-03 DIAGNOSIS — I509 Heart failure, unspecified: Secondary | ICD-10-CM | POA: Diagnosis not present

## 2021-06-03 DIAGNOSIS — I10 Essential (primary) hypertension: Secondary | ICD-10-CM | POA: Diagnosis not present

## 2021-06-03 DIAGNOSIS — R600 Localized edema: Secondary | ICD-10-CM | POA: Diagnosis not present

## 2021-06-03 DIAGNOSIS — E119 Type 2 diabetes mellitus without complications: Secondary | ICD-10-CM | POA: Diagnosis not present

## 2021-06-03 DIAGNOSIS — R001 Bradycardia, unspecified: Secondary | ICD-10-CM | POA: Diagnosis not present

## 2021-06-03 DIAGNOSIS — I208 Other forms of angina pectoris: Secondary | ICD-10-CM | POA: Diagnosis not present

## 2021-06-21 ENCOUNTER — Other Ambulatory Visit: Payer: Self-pay | Admitting: Internal Medicine

## 2021-06-22 ENCOUNTER — Encounter: Payer: Self-pay | Admitting: Nurse Practitioner

## 2021-07-15 ENCOUNTER — Ambulatory Visit: Payer: Medicare PPO | Admitting: Nurse Practitioner

## 2021-08-12 ENCOUNTER — Telehealth: Payer: Self-pay

## 2021-08-12 NOTE — Telephone Encounter (Signed)
Left vm to confirm 08/16/21 appointment-Toni ?

## 2021-08-13 DIAGNOSIS — I495 Sick sinus syndrome: Secondary | ICD-10-CM | POA: Diagnosis not present

## 2021-08-16 ENCOUNTER — Encounter: Payer: Self-pay | Admitting: Nurse Practitioner

## 2021-08-16 ENCOUNTER — Ambulatory Visit (INDEPENDENT_AMBULATORY_CARE_PROVIDER_SITE_OTHER): Payer: Medicare PPO | Admitting: Nurse Practitioner

## 2021-08-16 VITALS — BP 140/80 | HR 65 | Temp 98.3°F | Resp 16 | Ht 75.5 in | Wt 254.6 lb

## 2021-08-16 DIAGNOSIS — M1A0210 Idiopathic chronic gout, right elbow, without tophus (tophi): Secondary | ICD-10-CM

## 2021-08-16 DIAGNOSIS — N400 Enlarged prostate without lower urinary tract symptoms: Secondary | ICD-10-CM | POA: Diagnosis not present

## 2021-08-16 DIAGNOSIS — E559 Vitamin D deficiency, unspecified: Secondary | ICD-10-CM

## 2021-08-16 DIAGNOSIS — K219 Gastro-esophageal reflux disease without esophagitis: Secondary | ICD-10-CM

## 2021-08-16 DIAGNOSIS — Z0001 Encounter for general adult medical examination with abnormal findings: Secondary | ICD-10-CM

## 2021-08-16 DIAGNOSIS — E782 Mixed hyperlipidemia: Secondary | ICD-10-CM | POA: Diagnosis not present

## 2021-08-16 DIAGNOSIS — R300 Dysuria: Secondary | ICD-10-CM | POA: Diagnosis not present

## 2021-08-16 DIAGNOSIS — E1165 Type 2 diabetes mellitus with hyperglycemia: Secondary | ICD-10-CM | POA: Diagnosis not present

## 2021-08-16 DIAGNOSIS — E538 Deficiency of other specified B group vitamins: Secondary | ICD-10-CM | POA: Diagnosis not present

## 2021-08-16 DIAGNOSIS — I10 Essential (primary) hypertension: Secondary | ICD-10-CM | POA: Diagnosis not present

## 2021-08-16 LAB — POCT GLYCOSYLATED HEMOGLOBIN (HGB A1C): Hemoglobin A1C: 7 % — AB (ref 4.0–5.6)

## 2021-08-16 MED ORDER — ALLOPURINOL 300 MG PO TABS: 300.0000 mg | ORAL_TABLET | Freq: Every day | ORAL | 1 refills | Status: DC

## 2021-08-16 MED ORDER — ESOMEPRAZOLE MAGNESIUM 40 MG PO CPDR: 40.0000 mg | DELAYED_RELEASE_CAPSULE | Freq: Every day | ORAL | 1 refills | Status: DC

## 2021-08-16 MED ORDER — TAMSULOSIN HCL 0.4 MG PO CAPS: 0.4000 mg | ORAL_CAPSULE | Freq: Every day | ORAL | 1 refills | Status: DC

## 2021-08-16 MED ORDER — SIMVASTATIN 10 MG PO TABS: 10.0000 mg | ORAL_TABLET | Freq: Every day | ORAL | 1 refills | Status: DC

## 2021-08-16 MED ORDER — LOSARTAN POTASSIUM-HCTZ 100-12.5 MG PO TABS: 1.0000 | ORAL_TABLET | Freq: Every day | ORAL | 1 refills | Status: DC

## 2021-08-16 MED ORDER — METOPROLOL SUCCINATE ER 50 MG PO TB24: 50.0000 mg | ORAL_TABLET | Freq: Every day | ORAL | 1 refills | Status: DC

## 2021-08-16 MED ORDER — METFORMIN HCL 500 MG PO TABS: 500.0000 mg | ORAL_TABLET | Freq: Two times a day (BID) | ORAL | 1 refills | Status: DC

## 2021-08-16 MED ORDER — ZOSTER VAC RECOMB ADJUVANTED 50 MCG/0.5ML IM SUSR: 0.5000 mL | Freq: Once | INTRAMUSCULAR | 0 refills | Status: AC

## 2021-08-16 NOTE — Progress Notes (Signed)
[facility] ?[address] ?[place], [place] [address] ? ?Internal MEDICINE  ?Office Visit Note ? ?Patient Name: Gary MIZZELL Sr. ? [id number]  ?[id number] ? ?Date of Service: 08/16/2021 ? ?Chief Complaint  ?Patient presents with  ? Medicare Wellness  ?  weight  ? Diabetes  ? Gastroesophageal Reflux  ? Hyperlipidemia  ? Hypertension  ? ? ?HPI ?Durand presents for an annual well visit and physical exam.  He is a well-appearing 76 year old male with hypertension, CAD, gastroesophageal reflux, diabetes type 2, prostate cancer, and gout.  He is due for a foot exam today.  He is also due for the shingles vaccine. ?He is also due for routine labs and medication refills. ?At his previous office visit his A1c had stayed the same and was 6.8 again.  His issue has been and continues to be his diet and he acknowledges that he does need to work on his diet and through discussion during his office visit today it is clear that he knows what changes he needs to make in his diet he just needs to follow through and make the changes. ? ? ? ? ?Current Medication: ?Outpatient Encounter Medications as of 08/16/2021  ?Medication Sig  ? Accu-Chek Softclix Lancets lancets Use as instructed twice a day DX E11.65  ? apixaban (ELIQUIS) 5 MG TABS tablet Take by mouth.  ? aspirin EC 81 MG tablet Take 81 mg by mouth daily. Swallow whole.  ? carvedilol (COREG) 25 MG tablet Take 25 mg by mouth 2 (two) times daily.  ? Colchicine (MITIGARE) 0.6 MG CAPS For acute gout attack, take 2 tablets now then take 1 more tablet 1 hour later on the first day. Then take 1 tablet daily until flare up resolves.  ? furosemide (LASIX) 20 MG tablet Take by mouth.  ? glucose blood (ACCU-CHEK GUIDE) test strip Use as directed  twice a day Dx E11.65  ? potassium chloride SA (KLOR-CON) 20 MEQ tablet Take 1 tablet by mouth daily.  ? [DISCONTINUED] allopurinol (ZYLOPRIM) 300 MG tablet Take 1 tablet (300 mg total) by mouth daily.  ? [DISCONTINUED] esomeprazole  (NEXIUM) 40 MG capsule Take 1 capsule (40 mg total) by mouth daily at 12 noon.  ? [DISCONTINUED] losartan-hydrochlorothiazide (HYZAAR) 100-12.5 MG tablet Take 1 tablet by mouth daily.  ? [DISCONTINUED] metFORMIN (GLUCOPHAGE) 500 MG tablet Take 1 tablet (500 mg total) by mouth 2 (two) times daily with a meal.  ? [DISCONTINUED] metoprolol succinate (TOPROL-XL) 50 MG 24 hr tablet Take 1 tablet (50 mg total) by mouth daily.  ? [DISCONTINUED] simvastatin (ZOCOR) 10 MG tablet TAKE 1 TABLET BY MOUTH EVERY DAY  ? [DISCONTINUED] tamsulosin (FLOMAX) 0.4 MG CAPS capsule Take 1 capsule (0.4 mg total) by mouth daily.  ? [DISCONTINUED] Zoster Vaccine Adjuvanted [facility]) injection Inject 0.5 mLs into the muscle once.  ? allopurinol (ZYLOPRIM) 300 MG tablet Take 1 tablet (300 mg total) by mouth daily.  ? esomeprazole (NEXIUM) 40 MG capsule Take 1 capsule (40 mg total) by mouth daily at 12 noon.  ? losartan-hydrochlorothiazide (HYZAAR) 100-12.5 MG tablet Take 1 tablet by mouth daily.  ? metFORMIN (GLUCOPHAGE) 500 MG tablet Take 1 tablet (500 mg total) by mouth 2 (two) times daily with a meal.  ? metoprolol succinate (TOPROL-XL) 50 MG 24 hr tablet Take 1 tablet (50 mg total) by mouth daily.  ? simvastatin (ZOCOR) 10 MG tablet Take 1 tablet (10 mg total) by mouth daily.  ? tamsulosin (FLOMAX) 0.4 MG CAPS capsule Take 1  capsule (0.4 mg total) by mouth daily.  ? [EXPIRED] Zoster Vaccine Adjuvanted [facility]) injection Inject 0.5 mLs into the muscle once for 1 dose.  ? ?No facility-administered encounter medications on file as of 08/16/2021.  ? ? ?Surgical History: ?Past Surgical History:  ?Procedure Laterality Date  ? CARDIAC CATHETERIZATION    ? COLONOSCOPY WITH PROPOFOL N/A 01/24/2020  ? Procedure: COLONOSCOPY WITH PROPOFOL;  Surgeon: [name], MD;  Location: [facility] ENDOSCOPY;  Service: Gastroenterology;  Laterality: N/A;  ? CORONARY/GRAFT ACUTE MI REVASCULARIZATION N/A 05/02/2018  ? Procedure: Coronary/Graft Acute MI  Revascularization;  Surgeon: [name], MD;  Location: [place] CV LAB;  Service: Cardiovascular;  Laterality: N/A;  ? HERNIA REPAIR    ? LEFT HEART CATH AND CORONARY ANGIOGRAPHY N/A 05/02/2018  ? Procedure: LEFT HEART CATH AND CORONARY ANGIOGRAPHY;  Surgeon: [name], MD;  Location: [place] CV LAB;  Service: Cardiovascular;  Laterality: N/A;  ? PACEMAKER INSERTION Left 05/03/2018  ? Procedure: INSERTION PACEMAKER;  Surgeon: [name], MD;  Location: [facility] ORS;  Service: Cardiovascular;  Laterality: Left;  ? TEMPORARY PACEMAKER N/A 05/02/2018  ? Procedure: TEMPORARY PACEMAKER;  Surgeon: [name], MD;  Location: [place] CV LAB;  Service: Cardiovascular;  Laterality: N/A;  ? TEMPORARY PACEMAKER Right 05/03/2018  ? Procedure: TEMPORARY PACEMAKER REMOVAL;  Surgeon: [name], MD;  Location: [facility] ORS;  Service: Cardiovascular;  Laterality: Right;  ? TONSILLECTOMY Bilateral   ? 1992  ? ? ?Medical History: ?Past Medical History:  ?Diagnosis Date  ? Diabetes mellitus without complication ([place])   ? GERD (gastroesophageal reflux disease)   ? Hyperlipidemia   ? Hypertension   ? ? ?Family History: ?Family History  ?Problem Relation Age of Onset  ? Cirrhosis Father   ? Bone cancer Mother   ? ? ?Social History  ? ?Socioeconomic History  ? Marital status: Married  ?  Spouse name: Not on file  ? Number of children: Not on file  ? Years of education: Not on file  ? Highest education level: Not on file  ?Occupational History  ? Not on file  ?Tobacco Use  ? Smoking status: Former  ?  Types: Cigarettes  ? Smokeless tobacco: Never  ?Vaping Use  ? Vaping Use: Never used  ?Substance and Sexual Activity  ? Alcohol use: No  ? Drug use: No  ? Sexual activity: Not on file  ?Other Topics Concern  ? Not on file  ?Social History Narrative  ? Not on file  ? ?Social Determinants of Health  ? ?Financial Resource Strain: Not on file  ?Food Insecurity: Not on file  ?Transportation  Needs: Not on file  ?Physical Activity: Not on file  ?Stress: Not on file  ?Social Connections: Not on file  ?Intimate Partner Violence: Not on file  ? ? ? ? ?Review of Systems  ?Constitutional:  Negative for activity change, appetite change, chills, fatigue, fever and unexpected weight change.  ?HENT: Negative.  Negative for congestion, ear pain, rhinorrhea, sore throat and trouble swallowing.   ?Eyes: Negative.   ?Respiratory: Negative.  Negative for cough, chest tightness, shortness of breath and wheezing.   ?Cardiovascular: Negative.  Negative for chest pain.  ?Gastrointestinal: Negative.  Negative for abdominal pain, blood in stool, constipation, diarrhea, nausea and vomiting.  ?Endocrine: Negative.   ?Genitourinary: Negative.  Negative for difficulty urinating, dysuria, frequency, hematuria and urgency.  ?Musculoskeletal: Negative.  Negative for arthralgias, back pain, joint swelling, myalgias and neck pain.  ?Skin: Negative.  Negative for  rash and wound.  ?Allergic/Immunologic: Negative.  Negative for immunocompromised state.  ?Neurological: Negative.  Negative for dizziness, seizures, numbness and headaches.  ?Hematological: Negative.   ?Psychiatric/Behavioral: Negative.  Negative for behavioral problems, self-injury and suicidal ideas. The patient is not nervous/anxious.   ? ?Vital Signs: ?BP 140/80   Pulse 65   Temp 98.3 ?F (36.8 ?C)   Resp 16   Ht 6' 3.5" (1.918 m)   Wt 254 lb 9.6 oz (115.5 kg)   SpO2 98%   BMI 31.40 kg/m?  ? ? ?Physical Exam ?Vitals reviewed.  ?Constitutional:   ?   General: He is awake. He is not in acute distress. ?   Appearance: Normal appearance. He is well-developed and well-groomed. He is obese. He is not ill-appearing or diaphoretic.  ?HENT:  ?   Head: Normocephalic and atraumatic.  ?   Right Ear: Tympanic membrane, ear canal and external ear normal.  ?   Left Ear: Tympanic membrane, ear canal and external ear normal.  ?   Nose: Nose normal. No congestion or rhinorrhea.  ?    Mouth/Throat:  ?   Lips: Pink.  ?   Mouth: Mucous membranes are moist.  ?   Pharynx: Oropharynx is clear. Uvula midline. No oropharyngeal exudate or posterior oropharyngeal erythema.  ?Eyes:  ?   General: Lids are normal. Vision

## 2021-08-17 LAB — UA/M W/RFLX CULTURE, ROUTINE
Bilirubin, UA: NEGATIVE
Glucose, UA: NEGATIVE
Ketones, UA: NEGATIVE
Leukocytes,UA: NEGATIVE
Nitrite, UA: NEGATIVE
Protein,UA: NEGATIVE
RBC, UA: NEGATIVE
Specific Gravity, UA: 1.008 (ref 1.005–1.030)
Urobilinogen, Ur: 0.2 mg/dL (ref 0.2–1.0)
pH, UA: 7.5 (ref 5.0–7.5)

## 2021-08-17 LAB — MICROSCOPIC EXAMINATION
Bacteria, UA: NONE SEEN
Casts: NONE SEEN /lpf
Epithelial Cells (non renal): NONE SEEN /hpf (ref 0–10)
RBC, Urine: NONE SEEN /hpf (ref 0–2)
WBC, UA: NONE SEEN /hpf (ref 0–5)

## 2021-08-24 ENCOUNTER — Encounter: Payer: Self-pay | Admitting: Nurse Practitioner

## 2021-08-26 ENCOUNTER — Ambulatory Visit: Payer: Medicare PPO | Admitting: Nurse Practitioner

## 2021-10-02 ENCOUNTER — Other Ambulatory Visit
Admission: RE | Admit: 2021-10-02 | Discharge: 2021-10-02 | Disposition: A | Discharge status: Home / Self Care | Payer: Medicare HMO | Source: Ambulatory Visit | Attending: Student | Admitting: Student

## 2021-10-02 DIAGNOSIS — R600 Localized edema: Secondary | ICD-10-CM | POA: Diagnosis not present

## 2021-10-02 DIAGNOSIS — Z950 Presence of cardiac pacemaker: Secondary | ICD-10-CM | POA: Diagnosis not present

## 2021-10-02 DIAGNOSIS — I208 Other forms of angina pectoris: Secondary | ICD-10-CM | POA: Diagnosis not present

## 2021-10-02 DIAGNOSIS — R0602 Shortness of breath: Secondary | ICD-10-CM | POA: Diagnosis not present

## 2021-10-02 DIAGNOSIS — L819 Disorder of pigmentation, unspecified: Secondary | ICD-10-CM | POA: Diagnosis not present

## 2021-10-02 DIAGNOSIS — J811 Chronic pulmonary edema: Secondary | ICD-10-CM | POA: Diagnosis not present

## 2021-10-02 DIAGNOSIS — I480 Paroxysmal atrial fibrillation: Secondary | ICD-10-CM | POA: Diagnosis not present

## 2021-10-02 DIAGNOSIS — I10 Essential (primary) hypertension: Secondary | ICD-10-CM | POA: Diagnosis not present

## 2021-10-02 DIAGNOSIS — E119 Type 2 diabetes mellitus without complications: Secondary | ICD-10-CM | POA: Diagnosis not present

## 2021-10-02 DIAGNOSIS — I495 Sick sinus syndrome: Secondary | ICD-10-CM | POA: Diagnosis not present

## 2021-10-02 LAB — BRAIN NATRIURETIC PEPTIDE: B Natriuretic Peptide: 308.8 pg/mL — ABNORMAL HIGH (ref 0.0–100.0)

## 2021-10-23 DIAGNOSIS — I208 Other forms of angina pectoris: Secondary | ICD-10-CM | POA: Diagnosis not present

## 2021-10-23 DIAGNOSIS — L819 Disorder of pigmentation, unspecified: Secondary | ICD-10-CM | POA: Diagnosis not present

## 2021-10-23 DIAGNOSIS — R600 Localized edema: Secondary | ICD-10-CM | POA: Diagnosis not present

## 2021-10-31 DIAGNOSIS — L819 Disorder of pigmentation, unspecified: Secondary | ICD-10-CM | POA: Diagnosis not present

## 2021-10-31 DIAGNOSIS — I10 Essential (primary) hypertension: Secondary | ICD-10-CM | POA: Diagnosis not present

## 2021-10-31 DIAGNOSIS — I480 Paroxysmal atrial fibrillation: Secondary | ICD-10-CM | POA: Diagnosis not present

## 2021-10-31 DIAGNOSIS — I495 Sick sinus syndrome: Secondary | ICD-10-CM | POA: Diagnosis not present

## 2021-10-31 DIAGNOSIS — E119 Type 2 diabetes mellitus without complications: Secondary | ICD-10-CM | POA: Diagnosis not present

## 2021-10-31 DIAGNOSIS — I208 Other forms of angina pectoris: Secondary | ICD-10-CM | POA: Diagnosis not present

## 2021-10-31 DIAGNOSIS — Z950 Presence of cardiac pacemaker: Secondary | ICD-10-CM | POA: Diagnosis not present

## 2021-10-31 DIAGNOSIS — R600 Localized edema: Secondary | ICD-10-CM | POA: Diagnosis not present

## 2021-10-31 DIAGNOSIS — R0602 Shortness of breath: Secondary | ICD-10-CM | POA: Diagnosis not present

## 2021-11-13 ENCOUNTER — Encounter: Payer: Self-pay | Admitting: Nurse Practitioner

## 2021-11-13 ENCOUNTER — Ambulatory Visit (INDEPENDENT_AMBULATORY_CARE_PROVIDER_SITE_OTHER): Payer: Medicare HMO | Admitting: Nurse Practitioner

## 2021-11-13 VITALS — BP 130/72 | HR 85 | Temp 98.1°F | Resp 16 | Ht 75.5 in | Wt 253.0 lb

## 2021-11-13 DIAGNOSIS — E782 Mixed hyperlipidemia: Secondary | ICD-10-CM

## 2021-11-13 DIAGNOSIS — I10 Essential (primary) hypertension: Secondary | ICD-10-CM

## 2021-11-13 DIAGNOSIS — M1A0210 Idiopathic chronic gout, right elbow, without tophus (tophi): Secondary | ICD-10-CM | POA: Diagnosis not present

## 2021-11-13 DIAGNOSIS — E1165 Type 2 diabetes mellitus with hyperglycemia: Secondary | ICD-10-CM | POA: Diagnosis not present

## 2021-11-13 LAB — POCT GLYCOSYLATED HEMOGLOBIN (HGB A1C): Hemoglobin A1C: 7.2 % — AB (ref 4.0–5.6)

## 2021-11-13 MED ORDER — METFORMIN HCL ER 750 MG PO TB24: 750.0000 mg | ORAL_TABLET | Freq: Every day | ORAL | 1 refills | Status: DC

## 2021-11-13 MED ORDER — ALLOPURINOL 300 MG PO TABS: 300.0000 mg | ORAL_TABLET | Freq: Every day | ORAL | 1 refills | Status: DC

## 2021-11-13 NOTE — Progress Notes (Signed)
[facility] [place], [place] [address]  Internal MEDICINE  Office Visit Note  Patient Name: [name]  [id number]  [id number]  Date of Service: 11/13/2021  Chief Complaint  Patient presents with   Follow-up   Diabetes   Gastroesophageal Reflux   Hypertension   Hyperlipidemia    HPI Hughie presents for a follow up visit for diabetes, hyperlipidemia, and hypertension. At his previous office visit his A1c had stayed the same and was 6.8 again. His A1c is slightly increased to 7.2 today.  His issue has been and continues to be his diet and he acknowledges that he does need to work on his diet. The patient also states that he has not been taking the metformin because it was making him feel sick. He also reports that he has been eating a lot of sweets especially cake.  His blood pressure has been controlled with current medications.    Current Medication: Outpatient Encounter Medications as of 11/13/2021  Medication Sig   Accu-Chek Softclix Lancets lancets Use as instructed twice a day DX E11.65   apixaban (ELIQUIS) 5 MG TABS tablet Take by mouth.   aspirin EC 81 MG tablet Take 81 mg by mouth daily. Swallow whole.   carvedilol (COREG) 25 MG tablet Take 25 mg by mouth 2 (two) times daily.   Colchicine (MITIGARE) 0.6 MG CAPS For acute gout attack, take 2 tablets now then take 1 more tablet 1 hour later on the first day. Then take 1 tablet daily until flare up resolves.   esomeprazole (NEXIUM) 40 MG capsule Take 1 capsule (40 mg total) by mouth daily at 12 noon.   furosemide (LASIX) 40 MG tablet Take by mouth.   glucose blood (ACCU-CHEK GUIDE) test strip Use as directed  twice a day Dx E11.65   losartan-hydrochlorothiazide (HYZAAR) 100-12.5 MG tablet Take 1 tablet by mouth daily.   metFORMIN (GLUCOPHAGE-XR) 750 MG 24 hr tablet Take 1 tablet (750 mg total) by mouth daily with breakfast.   metoprolol succinate (TOPROL-XL) 50 MG 24 hr tablet Take 1 tablet (50  mg total) by mouth daily.   potassium chloride SA (KLOR-CON) 20 MEQ tablet Take 1 tablet by mouth daily.   simvastatin (ZOCOR) 10 MG tablet Take 1 tablet (10 mg total) by mouth daily.   tamsulosin (FLOMAX) 0.4 MG CAPS capsule Take 1 capsule (0.4 mg total) by mouth daily.   [DISCONTINUED] allopurinol (ZYLOPRIM) 300 MG tablet Take 1 tablet (300 mg total) by mouth daily.   [DISCONTINUED] metFORMIN (GLUCOPHAGE) 500 MG tablet Take 1 tablet (500 mg total) by mouth 2 (two) times daily with a meal.   allopurinol (ZYLOPRIM) 300 MG tablet Take 1 tablet (300 mg total) by mouth daily.   [DISCONTINUED] furosemide (LASIX) 20 MG tablet Take by mouth. (Patient not taking: Reported on 11/13/2021)   No facility-administered encounter medications on file as of 11/13/2021.    Surgical History: Past Surgical History:  Procedure Laterality Date   CARDIAC CATHETERIZATION     COLONOSCOPY WITH PROPOFOL N/A 01/24/2020   Procedure: COLONOSCOPY WITH PROPOFOL;  Surgeon: [name], MD;  Location: [facility] ENDOSCOPY;  Service: Gastroenterology;  Laterality: N/A;   CORONARY/GRAFT ACUTE MI REVASCULARIZATION N/A 05/02/2018   Procedure: Coronary/Graft Acute MI Revascularization;  Surgeon: [name], MD;  Location: [place] CV LAB;  Service: Cardiovascular;  Laterality: N/A;   HERNIA REPAIR     LEFT HEART CATH AND CORONARY ANGIOGRAPHY N/A 05/02/2018   Procedure: LEFT HEART CATH AND  CORONARY ANGIOGRAPHY;  Surgeon: [name], MD;  Location: [place] CV LAB;  Service: Cardiovascular;  Laterality: N/A;   PACEMAKER INSERTION Left 05/03/2018   Procedure: INSERTION PACEMAKER;  Surgeon: [name], MD;  Location: [facility] ORS;  Service: Cardiovascular;  Laterality: Left;   TEMPORARY PACEMAKER N/A 05/02/2018   Procedure: TEMPORARY PACEMAKER;  Surgeon: [name], MD;  Location: [place] CV LAB;  Service: Cardiovascular;  Laterality: N/A;   TEMPORARY PACEMAKER Right 05/03/2018   Procedure:  TEMPORARY PACEMAKER REMOVAL;  Surgeon: [name], MD;  Location: [facility] ORS;  Service: Cardiovascular;  Laterality: Right;   TONSILLECTOMY Bilateral    1992    Medical History: Past Medical History:  Diagnosis Date   Diabetes mellitus without complication ([place])    GERD (gastroesophageal reflux disease)    Hyperlipidemia    Hypertension     Family History: Family History  Problem Relation Age of Onset   Cirrhosis Father    Bone cancer Mother     Social History   Socioeconomic History   Marital status: Married    Spouse name: Not on file   Number of children: Not on file   Years of education: Not on file   Highest education level: Not on file  Occupational History   Not on file  Tobacco Use   Smoking status: Former    Types: Cigarettes   Smokeless tobacco: Never  Vaping Use   Vaping Use: Never used  Substance and Sexual Activity   Alcohol use: No   Drug use: No   Sexual activity: Not on file  Other Topics Concern   Not on file  Social History Narrative   Not on file   Social Determinants of Health   Financial Resource Strain: Not on file  Food Insecurity: Not on file  Transportation Needs: Not on file  Physical Activity: Not on file  Stress: Not on file  Social Connections: Not on file  Intimate Partner Violence: Not on file      Review of Systems  Constitutional:  Negative for chills, fatigue and unexpected weight change.  HENT:  Negative for congestion, rhinorrhea, sneezing and sore throat.   Eyes:  Negative for redness.  Respiratory:  Negative for cough, chest tightness and shortness of breath.   Cardiovascular:  Negative for chest pain and palpitations.  Gastrointestinal:  Negative for abdominal pain, constipation, diarrhea, nausea and vomiting.  Genitourinary:  Negative for dysuria and frequency.  Musculoskeletal:  Negative for arthralgias, back pain, joint swelling and neck pain.  Skin:  Negative for rash.  Neurological: Negative.   Negative for tremors and numbness.  Hematological:  Negative for adenopathy. Does not bruise/bleed easily.  Psychiatric/Behavioral:  Negative for behavioral problems (Depression), sleep disturbance and suicidal ideas. The patient is not nervous/anxious.     Vital Signs: BP 130/72   Pulse 85   Temp 98.1 F (36.7 C)   Resp 16   Ht 6' 3.5" (1.918 m)   Wt 253 lb (114.8 kg)   SpO2 97%   BMI 31.21 kg/m    Physical Exam Vitals reviewed.  Constitutional:      General: He is not in acute distress.    Appearance: Normal appearance. He is obese. He is not ill-appearing.  HENT:     Head: Normocephalic and atraumatic.  Eyes:     Pupils: Pupils are equal, round, and reactive to light.  Cardiovascular:     Rate and Rhythm: Normal rate and regular rhythm.  Pulmonary:  Effort: Pulmonary effort is normal. No respiratory distress.  Neurological:     Mental Status: He is alert and oriented to person, place, and time.     Cranial Nerves: No cranial nerve deficit.     Coordination: Coordination normal.     Gait: Gait normal.  Psychiatric:        Mood and Affect: Mood normal.        Behavior: Behavior normal.        Assessment/Plan: 1. Type 2 diabetes mellitus with hyperglycemia, without long-term current use of insulin (HCC) A1C is slightly increased to 7.2 compared to 6.8 in April this year. Metformin discontinued. Metformin XR 750 mg daily with breakfast prescribed. This should keep his glucose level more steady and hopefully cause less GI side effects.  - POCT HgB A1C - metFORMIN (GLUCOPHAGE-XR) 750 MG 24 hr tablet; Take 1 tablet (750 mg total) by mouth daily with breakfast.  Dispense: 90 tablet; Refill: 1  2. Essential hypertension Blood pressure is stable with current medication.  - furosemide (LASIX) 40 MG tablet; Take by mouth.  3. 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 Allopurinol  refills ordered.  - allopurinol (ZYLOPRIM) 300 MG tablet; Take 1 tablet (300 mg total) by mouth daily.  Dispense: 90 tablet; Refill: 1  4. Mixed hyperlipidemia Taking simvastatin, continue as prescribed.    General Counseling: demarques pilz understanding of the findings of todays visit and agrees with plan of treatment. I have discussed any further diagnostic evaluation that may be needed or ordered today. We also reviewed his medications today. he has been encouraged to call the office with any questions or concerns that should arise related to todays visit.    Orders Placed This Encounter  Procedures   POCT HgB A1C    Meds ordered this encounter  Medications   metFORMIN (GLUCOPHAGE-XR) 750 MG 24 hr tablet    Sig: Take 1 tablet (750 mg total) by mouth daily with breakfast.    Dispense:  90 tablet    Refill:  1    Dx E11.65, please discontinue previous orders for metformin, fill new prescription today.   allopurinol (ZYLOPRIM) 300 MG tablet    Sig: Take 1 tablet (300 mg total) by mouth daily.    Dispense:  90 tablet    Refill:  1    Return for F/U, Recheck A1C, Ulyana Pitones PCP.   Total time spent:30 Minutes Time spent includes review of chart, medications, test results, and follow up plan with the patient.   [place] Controlled Substance Database was reviewed by me.  This patient was seen by [name], FNP-C in collaboration with Dr. Clayborn Bigness as a part of collaborative care agreement.   Campbell Agramonte R. Valetta Fuller, MSN, FNP-C Internal medicine

## 2021-11-25 DIAGNOSIS — R0602 Shortness of breath: Secondary | ICD-10-CM | POA: Diagnosis not present

## 2021-11-25 DIAGNOSIS — R5383 Other fatigue: Secondary | ICD-10-CM | POA: Diagnosis not present

## 2021-11-25 DIAGNOSIS — I272 Pulmonary hypertension, unspecified: Secondary | ICD-10-CM | POA: Diagnosis not present

## 2021-12-02 ENCOUNTER — Other Ambulatory Visit
Admission: RE | Admit: 2021-12-02 | Discharge: 2021-12-02 | Disposition: A | Discharge status: Home / Self Care | Payer: Medicare HMO | Source: Ambulatory Visit | Attending: Student | Admitting: Student

## 2021-12-02 DIAGNOSIS — Z01818 Encounter for other preprocedural examination: Secondary | ICD-10-CM | POA: Insufficient documentation

## 2021-12-02 DIAGNOSIS — I208 Other forms of angina pectoris: Secondary | ICD-10-CM | POA: Diagnosis not present

## 2021-12-02 DIAGNOSIS — R600 Localized edema: Secondary | ICD-10-CM | POA: Diagnosis not present

## 2021-12-02 DIAGNOSIS — I495 Sick sinus syndrome: Secondary | ICD-10-CM | POA: Diagnosis not present

## 2021-12-02 DIAGNOSIS — R0602 Shortness of breath: Secondary | ICD-10-CM | POA: Insufficient documentation

## 2021-12-02 DIAGNOSIS — I480 Paroxysmal atrial fibrillation: Secondary | ICD-10-CM | POA: Diagnosis not present

## 2021-12-02 DIAGNOSIS — Z950 Presence of cardiac pacemaker: Secondary | ICD-10-CM | POA: Diagnosis not present

## 2021-12-02 DIAGNOSIS — I10 Essential (primary) hypertension: Secondary | ICD-10-CM | POA: Diagnosis not present

## 2021-12-02 DIAGNOSIS — L819 Disorder of pigmentation, unspecified: Secondary | ICD-10-CM | POA: Diagnosis not present

## 2021-12-02 LAB — BRAIN NATRIURETIC PEPTIDE: B Natriuretic Peptide: 272.3 pg/mL — ABNORMAL HIGH (ref 0.0–100.0)

## 2021-12-16 DIAGNOSIS — I200 Unstable angina: Secondary | ICD-10-CM

## 2021-12-19 ENCOUNTER — Other Ambulatory Visit: Payer: Self-pay | Admitting: Nurse Practitioner

## 2021-12-19 DIAGNOSIS — M1A0210 Idiopathic chronic gout, right elbow, without tophus (tophi): Secondary | ICD-10-CM

## 2021-12-21 DIAGNOSIS — G4733 Obstructive sleep apnea (adult) (pediatric): Secondary | ICD-10-CM | POA: Diagnosis not present

## 2021-12-30 ENCOUNTER — Other Ambulatory Visit: Payer: Self-pay

## 2021-12-30 ENCOUNTER — Ambulatory Visit
Admission: RE | Admit: 2021-12-30 | Discharge: 2021-12-30 | Disposition: A | Discharge status: Home / Self Care | Payer: Medicare HMO | Attending: Internal Medicine | Admitting: Internal Medicine

## 2021-12-30 ENCOUNTER — Encounter
Admission: RE | Disposition: A | Discharge status: Home / Self Care | Payer: Self-pay | Source: Home / Self Care | Attending: Internal Medicine

## 2021-12-30 ENCOUNTER — Encounter: Payer: Self-pay | Admitting: Internal Medicine

## 2021-12-30 DIAGNOSIS — I200 Unstable angina: Secondary | ICD-10-CM | POA: Diagnosis not present

## 2021-12-30 DIAGNOSIS — Z79899 Other long term (current) drug therapy: Secondary | ICD-10-CM | POA: Diagnosis not present

## 2021-12-30 DIAGNOSIS — M109 Gout, unspecified: Secondary | ICD-10-CM | POA: Insufficient documentation

## 2021-12-30 DIAGNOSIS — E669 Obesity, unspecified: Secondary | ICD-10-CM | POA: Insufficient documentation

## 2021-12-30 DIAGNOSIS — R0602 Shortness of breath: Secondary | ICD-10-CM | POA: Diagnosis not present

## 2021-12-30 DIAGNOSIS — K219 Gastro-esophageal reflux disease without esophagitis: Secondary | ICD-10-CM | POA: Diagnosis not present

## 2021-12-30 DIAGNOSIS — C61 Malignant neoplasm of prostate: Secondary | ICD-10-CM | POA: Diagnosis not present

## 2021-12-30 DIAGNOSIS — I480 Paroxysmal atrial fibrillation: Secondary | ICD-10-CM | POA: Diagnosis not present

## 2021-12-30 DIAGNOSIS — E119 Type 2 diabetes mellitus without complications: Secondary | ICD-10-CM | POA: Diagnosis not present

## 2021-12-30 DIAGNOSIS — I10 Essential (primary) hypertension: Secondary | ICD-10-CM | POA: Diagnosis not present

## 2021-12-30 HISTORY — PX: RIGHT/LEFT HEART CATH AND CORONARY ANGIOGRAPHY: CATH118266

## 2021-12-30 LAB — GLUCOSE, CAPILLARY
Glucose-Capillary: 151 mg/dL — ABNORMAL HIGH (ref 70–99)
Glucose-Capillary: 125 mg/dL — ABNORMAL HIGH (ref 70–99)

## 2021-12-30 SURGERY — RIGHT/LEFT HEART CATH AND CORONARY ANGIOGRAPHY
Anesthesia: Moderate Sedation | Laterality: Bilateral

## 2021-12-30 MED ORDER — SODIUM CHLORIDE 0.9 % IV SOLN
250.0000 mL | INTRAVENOUS | Status: DC | PRN
Start: 2021-12-30 — End: 2021-12-30

## 2021-12-30 MED ORDER — SODIUM CHLORIDE 0.9 % WEIGHT BASED INFUSION
3.0000 mL/kg/h | INTRAVENOUS | Status: AC
Start: 2021-12-30 — End: 2021-12-30
  Administered 2021-12-30: 3 mL/kg/h via INTRAVENOUS

## 2021-12-30 MED ORDER — ASPIRIN 81 MG PO CHEW
81.0000 mg | CHEWABLE_TABLET | ORAL | Status: AC
  Administered 2021-12-30: 81 mg via ORAL

## 2021-12-30 MED ORDER — VERAPAMIL HCL 2.5 MG/ML IV SOLN
INTRAVENOUS | Status: DC | PRN
  Administered 2021-12-30: 2.5 mg via INTRA_ARTERIAL

## 2021-12-30 MED ORDER — ACETAMINOPHEN 325 MG PO TABS: 650.0000 mg | ORAL_TABLET | ORAL | Status: DC | PRN

## 2021-12-30 MED ORDER — ASPIRIN 81 MG PO CHEW
CHEWABLE_TABLET | ORAL | Status: AC
  Filled 2021-12-30: qty 1

## 2021-12-30 MED ORDER — HEPARIN (PORCINE) IN NACL 1000-0.9 UT/500ML-% IV SOLN
INTRAVENOUS | Status: DC | PRN
  Administered 2021-12-30 (×2): 500 mL

## 2021-12-30 MED ORDER — SODIUM CHLORIDE 0.9 % WEIGHT BASED INFUSION
1.0000 mL/kg/h | INTRAVENOUS | Status: DC
  Administered 2021-12-30: 1 mL/kg/h via INTRAVENOUS

## 2021-12-30 MED ORDER — ONDANSETRON HCL 4 MG/2ML IJ SOLN: 4.0000 mg | Freq: Four times a day (QID) | INTRAMUSCULAR | Status: DC | PRN

## 2021-12-30 MED ORDER — VERAPAMIL HCL 2.5 MG/ML IV SOLN
INTRAVENOUS | Status: AC
  Filled 2021-12-30: qty 2

## 2021-12-30 MED ORDER — SODIUM CHLORIDE 0.9% FLUSH: 3.0000 mL | Freq: Two times a day (BID) | INTRAVENOUS | Status: DC

## 2021-12-30 MED ORDER — LIDOCAINE HCL 1 % IJ SOLN
INTRAMUSCULAR | Status: AC
  Filled 2021-12-30: qty 20

## 2021-12-30 MED ORDER — SODIUM CHLORIDE 0.9% FLUSH
3.0000 mL | INTRAVENOUS | Status: DC | PRN
Start: 2021-12-30 — End: 2021-12-30

## 2021-12-30 MED ORDER — LABETALOL HCL 5 MG/ML IV SOLN: 10.0000 mg | INTRAVENOUS | Status: DC | PRN

## 2021-12-30 MED ORDER — MIDAZOLAM HCL 2 MG/2ML IJ SOLN
INTRAMUSCULAR | Status: DC | PRN
  Administered 2021-12-30 (×2): 1 mg via INTRAVENOUS

## 2021-12-30 MED ORDER — LIDOCAINE HCL (PF) 1 % IJ SOLN
INTRAMUSCULAR | Status: DC | PRN
  Administered 2021-12-30: 5 mL
  Administered 2021-12-30: 20 mL

## 2021-12-30 MED ORDER — IOHEXOL 300 MG/ML  SOLN
INTRAMUSCULAR | Status: DC | PRN
  Administered 2021-12-30: 54 mL

## 2021-12-30 MED ORDER — MIDAZOLAM HCL 2 MG/2ML IJ SOLN
INTRAMUSCULAR | Status: AC
  Filled 2021-12-30: qty 2

## 2021-12-30 MED ORDER — SODIUM CHLORIDE 0.9 % IV SOLN: 250.0000 mL | INTRAVENOUS | Status: DC | PRN

## 2021-12-30 MED ORDER — HEPARIN (PORCINE) IN NACL 1000-0.9 UT/500ML-% IV SOLN
INTRAVENOUS | Status: AC
  Filled 2021-12-30: qty 1000

## 2021-12-30 MED ORDER — FENTANYL CITRATE (PF) 100 MCG/2ML IJ SOLN
INTRAMUSCULAR | Status: AC
  Filled 2021-12-30: qty 2

## 2021-12-30 MED ORDER — FENTANYL CITRATE (PF) 100 MCG/2ML IJ SOLN
INTRAMUSCULAR | Status: DC | PRN
  Administered 2021-12-30 (×2): 25 ug via INTRAVENOUS

## 2021-12-30 MED ORDER — HEPARIN SODIUM (PORCINE) 1000 UNIT/ML IJ SOLN
INTRAMUSCULAR | Status: AC
  Filled 2021-12-30: qty 10

## 2021-12-30 MED ORDER — HYDRALAZINE HCL 20 MG/ML IJ SOLN: 10.0000 mg | INTRAMUSCULAR | Status: DC | PRN

## 2021-12-30 MED ORDER — SODIUM CHLORIDE 0.9% FLUSH: 3.0000 mL | INTRAVENOUS | Status: DC | PRN

## 2021-12-30 SURGICAL SUPPLY — 20 items
BAND CMPR LRG ZPHR (HEMOSTASIS) ×1
BAND ZEPHYR COMPRESS 30 LONG (HEMOSTASIS) IMPLANT
CATH BALLN WEDGE 5F 110CM (CATHETERS) IMPLANT
CATH INFINITI 5FR MULTPACK ANG (CATHETERS) IMPLANT
DEVICE CLOSURE MYNXGRIP 5F (Vascular Products) IMPLANT
DRAPE BRACHIAL (DRAPES) IMPLANT
GLIDESHEATH SLEND SS 6F .021 (SHEATH) IMPLANT
GUIDEWIRE EMER 3M J .025X150CM (WIRE) IMPLANT
GUIDEWIRE INQWIRE 1.5J.035X260 (WIRE) IMPLANT
INQWIRE 1.5J .035X260CM (WIRE) ×1
NDL PERC 18GX7CM (NEEDLE) IMPLANT
NEEDLE PERC 18GX7CM (NEEDLE) ×1 IMPLANT
PACK CARDIAC CATH (CUSTOM PROCEDURE TRAY) ×1 IMPLANT
PROTECTION STATION PRESSURIZED (MISCELLANEOUS) ×1
SET ATX SIMPLICITY (MISCELLANEOUS) IMPLANT
SHEATH AVANTI 5FR X 11CM (SHEATH) IMPLANT
SHEATH GLIDE SLENDER 4/5FR (SHEATH) IMPLANT
STATION PROTECTION PRESSURIZED (MISCELLANEOUS) IMPLANT
WIRE GUIDERIGHT .035X150 (WIRE) IMPLANT
WIRE HITORQ VERSACORE ST 145CM (WIRE) IMPLANT

## 2022-01-01 ENCOUNTER — Observation Stay: Payer: Medicare HMO

## 2022-01-01 ENCOUNTER — Emergency Department: Payer: Medicare HMO

## 2022-01-01 ENCOUNTER — Other Ambulatory Visit: Payer: Self-pay

## 2022-01-01 ENCOUNTER — Observation Stay
Admission: EM | Admit: 2022-01-01 | Discharge: 2022-01-02 | Disposition: A | Discharge status: Home / Self Care | Payer: Medicare HMO | Attending: Internal Medicine | Admitting: Internal Medicine

## 2022-01-01 DIAGNOSIS — R4781 Slurred speech: Secondary | ICD-10-CM | POA: Diagnosis not present

## 2022-01-01 DIAGNOSIS — R4789 Other speech disturbances: Secondary | ICD-10-CM | POA: Insufficient documentation

## 2022-01-01 DIAGNOSIS — Z950 Presence of cardiac pacemaker: Secondary | ICD-10-CM | POA: Insufficient documentation

## 2022-01-01 DIAGNOSIS — R29818 Other symptoms and signs involving the nervous system: Secondary | ICD-10-CM | POA: Diagnosis not present

## 2022-01-01 DIAGNOSIS — I10 Essential (primary) hypertension: Secondary | ICD-10-CM | POA: Insufficient documentation

## 2022-01-01 DIAGNOSIS — Z7982 Long term (current) use of aspirin: Secondary | ICD-10-CM | POA: Diagnosis not present

## 2022-01-01 DIAGNOSIS — Z7901 Long term (current) use of anticoagulants: Secondary | ICD-10-CM | POA: Diagnosis not present

## 2022-01-01 DIAGNOSIS — Z8546 Personal history of malignant neoplasm of prostate: Secondary | ICD-10-CM | POA: Diagnosis not present

## 2022-01-01 DIAGNOSIS — E782 Mixed hyperlipidemia: Secondary | ICD-10-CM

## 2022-01-01 DIAGNOSIS — Z87891 Personal history of nicotine dependence: Secondary | ICD-10-CM | POA: Diagnosis not present

## 2022-01-01 DIAGNOSIS — I480 Paroxysmal atrial fibrillation: Secondary | ICD-10-CM | POA: Insufficient documentation

## 2022-01-01 DIAGNOSIS — R479 Unspecified speech disturbances: Secondary | ICD-10-CM | POA: Diagnosis present

## 2022-01-01 DIAGNOSIS — I2510 Atherosclerotic heart disease of native coronary artery without angina pectoris: Secondary | ICD-10-CM | POA: Diagnosis not present

## 2022-01-01 DIAGNOSIS — Z79899 Other long term (current) drug therapy: Secondary | ICD-10-CM | POA: Insufficient documentation

## 2022-01-01 DIAGNOSIS — R41841 Cognitive communication deficit: Secondary | ICD-10-CM | POA: Diagnosis not present

## 2022-01-01 DIAGNOSIS — Z7984 Long term (current) use of oral hypoglycemic drugs: Secondary | ICD-10-CM | POA: Insufficient documentation

## 2022-01-01 DIAGNOSIS — I639 Cerebral infarction, unspecified: Principal | ICD-10-CM | POA: Insufficient documentation

## 2022-01-01 DIAGNOSIS — I6522 Occlusion and stenosis of left carotid artery: Secondary | ICD-10-CM | POA: Diagnosis not present

## 2022-01-01 DIAGNOSIS — E119 Type 2 diabetes mellitus without complications: Secondary | ICD-10-CM | POA: Insufficient documentation

## 2022-01-01 LAB — CBC WITH DIFFERENTIAL/PLATELET
Abs Immature Granulocytes: 0.01 10*3/uL (ref 0.00–0.07)
Basophils Absolute: 0.1 10*3/uL (ref 0.0–0.1)
Basophils Relative: 1 %
Eosinophils Absolute: 0.2 10*3/uL (ref 0.0–0.5)
Eosinophils Relative: 5 %
HCT: 41.9 % (ref 39.0–52.0)
Hemoglobin: 13.8 g/dL (ref 13.0–17.0)
Immature Granulocytes: 0 %
Lymphocytes Relative: 30 %
Lymphs Abs: 1.6 10*3/uL (ref 0.7–4.0)
MCH: 26.2 pg (ref 26.0–34.0)
MCHC: 32.9 g/dL (ref 30.0–36.0)
MCV: 79.7 fL — ABNORMAL LOW (ref 80.0–100.0)
Monocytes Absolute: 0.5 10*3/uL (ref 0.1–1.0)
Monocytes Relative: 9 %
Neutro Abs: 2.9 10*3/uL (ref 1.7–7.7)
Neutrophils Relative %: 55 %
Platelets: 194 10*3/uL (ref 150–400)
RBC: 5.26 MIL/uL (ref 4.22–5.81)
RDW: 14.9 % (ref 11.5–15.5)
WBC: 5.2 10*3/uL (ref 4.0–10.5)
nRBC: 0 % (ref 0.0–0.2)

## 2022-01-01 LAB — COMPREHENSIVE METABOLIC PANEL
ALT: 17 U/L (ref 0–44)
AST: 20 U/L (ref 15–41)
Albumin: 4.1 g/dL (ref 3.5–5.0)
Alkaline Phosphatase: 77 U/L (ref 38–126)
Anion gap: 8 (ref 5–15)
BUN: 14 mg/dL (ref 8–23)
CO2: 26 mmol/L (ref 22–32)
Calcium: 9.7 mg/dL (ref 8.9–10.3)
Chloride: 104 mmol/L (ref 98–111)
Creatinine, Ser: 0.9 mg/dL (ref 0.61–1.24)
GFR, Estimated: >60 mL/min (ref >60)
Glucose, Bld: 120 mg/dL — ABNORMAL HIGH (ref 70–99)
Potassium: 3.6 mmol/L (ref 3.5–5.1)
Sodium: 138 mmol/L (ref 135–145)
Total Bilirubin: 0.5 mg/dL (ref 0.3–1.2)
Total Protein: 7.8 g/dL (ref 6.5–8.1)

## 2022-01-01 LAB — CBG MONITORING, ED: Glucose-Capillary: 109 mg/dL — ABNORMAL HIGH (ref 70–99)

## 2022-01-01 LAB — PROTIME-INR
INR: 1.1 (ref 0.8–1.2)
Prothrombin Time: 14.5 seconds (ref 11.4–15.2)

## 2022-01-01 LAB — TROPONIN I (HIGH SENSITIVITY)
Troponin I (High Sensitivity): 59 ng/L — ABNORMAL HIGH (ref <18)
Troponin I (High Sensitivity): 63 ng/L — ABNORMAL HIGH (ref <18)

## 2022-01-01 MED ORDER — ASPIRIN 81 MG PO CHEW
324.0000 mg | CHEWABLE_TABLET | Freq: Once | ORAL | Status: AC
  Administered 2022-01-01: 324 mg via ORAL
  Filled 2022-01-01: qty 4

## 2022-01-01 MED ORDER — ACETAMINOPHEN 325 MG PO TABS: 650.0000 mg | ORAL_TABLET | ORAL | Status: DC | PRN

## 2022-01-01 MED ORDER — INSULIN ASPART 100 UNIT/ML IJ SOLN
0.0000 [IU] | Freq: Three times a day (TID) | INTRAMUSCULAR | Status: DC
  Administered 2022-01-02 (×2): 3 [IU] via SUBCUTANEOUS
  Filled 2022-01-01 (×2): qty 1

## 2022-01-01 MED ORDER — INSULIN ASPART 100 UNIT/ML IJ SOLN: 0.0000 [IU] | Freq: Every day | INTRAMUSCULAR | Status: DC

## 2022-01-01 MED ORDER — STROKE: EARLY STAGES OF RECOVERY BOOK: Freq: Once | Status: AC

## 2022-01-01 MED ORDER — ACETAMINOPHEN 650 MG RE SUPP: 650.0000 mg | RECTAL | Status: DC | PRN

## 2022-01-01 MED ORDER — ACETAMINOPHEN 160 MG/5ML PO SOLN: 650.0000 mg | ORAL | Status: DC | PRN

## 2022-01-01 NOTE — Assessment & Plan Note (Signed)
S/p cardiac cath 8/28.  Results unavailable at this time Continue simvastatin and carvedilol

## 2022-01-01 NOTE — Assessment & Plan Note (Signed)
Continue carvedilol, losartan/HCTZ

## 2022-01-01 NOTE — ED Triage Notes (Signed)
Pt had a heart cath on Monday and has had trouble speaking since. Pt eliquis was stopped 3 days before cath. Pt also reports a headache

## 2022-01-01 NOTE — Assessment & Plan Note (Signed)
Sliding scale insulin coverage 

## 2022-01-01 NOTE — ED Provider Triage Note (Signed)
Emergency Medicine Provider Triage Evaluation Note  Gary Alar Sr. , a 76 y.o. male  was evaluated in triage.  Pt complains of slurred speech x 2 days after having heart cath. Also complaining of headache and not able to "get words out." Missed 3 days of Eliquis prior to procedure, but has since restarted it as advised by cardiology.  Physical Exam  BP (!) 166/96   Pulse 69   Temp 98 F (36.7 C) (Oral)   Resp 17   Ht '6\' 3"'$  (1.905 m)   Wt 111.2 kg   SpO2 98%   BMI 30.64 kg/m  Gen:   Awake, no distress   Resp:  Normal effort  MSK:   Moves extremities without difficulty  Other:    Medical Decision Making  Medically screening exam initiated at 4:02 PM.  Appropriate orders placed.  Gary Alar Sr. was informed that the remainder of the evaluation will be completed by another provider, this initial triage assessment does not replace that evaluation, and the importance of remaining in the ED until their evaluation is complete.  CVA protocol without code stroke activation due to length of time since onset of symptoms.   Victorino Dike, FNP 01/01/22 1941

## 2022-01-01 NOTE — Assessment & Plan Note (Addendum)
Patient presented outside the tPA window Was off anticoagulation for a few days prior to cardiac cath CT head shows Low-density in the left frontal lobe suggesting recent infarction Received aspirin in the emergency room We will resume patient's apixaban that was discontinued prior to his cath on 8/28 Continue simvastatin Stroke work-up to include echo, continuous cardiac monitoring and carotid Doppler Avoid dextrose containing fluids, Maintain euglycemia, euthermia Neuro checks q4 hrs x 24 hrs and then per shift Physical therapy/Occupational therapy/Speech therapy if failed dysphagia screen Neurology consult to follow

## 2022-01-01 NOTE — ED Provider Notes (Signed)
[facility] Provider Note    Event Date/Time   First MD Initiated Contact with Patient 01/01/22 2105     (approximate)   History   Cerebrovascular Accident and Neurologic Problem   HPI {Remember to add pertinent medical, surgical, social, and/or OB history to HPI:1} Gary DETTMAN Sr. is a 76 y.o. male who on review of cardiology note from July 31 has a history of coronary disease paroxysmal A-fib dual-chamber pacemaker hypertension diabetes  Patient had cardiac catheterization on Monday and advises after the procedure he woke up and noticed he felt like he could just not form words normally.  Persisted for a couple days now when he finds he is still not able to find his words at times causing him to come here for concern of possibly that he could have had a stroke during the procedure  Symptoms have not worsened since immediately postprocedure.  He reports he noticed it as soon as he woke up from anesthesia  No chest pain no headache no fevers or chills.  He is supposed to restart his anticoagulation this evening, but has not yet done so.     Physical Exam   Triage Vital Signs: ED Triage Vitals [01/01/22 1554]  Enc Vitals Group     BP (!) 166/96     Pulse Rate 69     Resp 17     Temp 98 F (36.7 C)     Temp Source Oral     SpO2 98 %     Weight 245 lb 2.4 oz (111.2 kg)     Height '6\' 3"'$  (1.905 m)     Head Circumference      Peak Flow      Pain Score 0     Pain Loc      Pain Edu?      Excl. in [place]?     Most recent vital signs: Vitals:   01/01/22 1554 01/01/22 2044  BP: (!) 166/96 (!) 162/99  Pulse: 69 73  Resp: 17 18  Temp: 98 F (36.7 C)   SpO2: 98% 97%     General: Awake, no distress.  CV:  Good peripheral perfusion.  Normal heart tones. Resp:  Normal effort.  Clear bilateral Abd:  No distention.  Other:    Modified NIH = 1 (mild expressive aphasia)   ED Results / Procedures / Treatments   Labs (all labs ordered are  listed, but only abnormal results are displayed) Labs Reviewed  COMPREHENSIVE METABOLIC PANEL - Abnormal; Notable for the following components:      Result Value   Glucose, Bld 120 (*)    All other components within normal limits  CBC WITH DIFFERENTIAL/PLATELET - Abnormal; Notable for the following components:   MCV 79.7 (*)    All other components within normal limits  TROPONIN I (HIGH SENSITIVITY) - Abnormal; Notable for the following components:   Troponin I (High Sensitivity) 59 (*)    All other components within normal limits  TROPONIN I (HIGH SENSITIVITY) - Abnormal; Notable for the following components:   Troponin I (High Sensitivity) 63 (*)    All other components within normal limits  PROTIME-INR  URINALYSIS, ROUTINE W REFLEX MICROSCOPIC     EKG  Interpreted by me at 1605 heart rate 65 QRS 500 QTc 200 Ventricular paced rhythm no obvious ischemia in the setting   RADIOLOGY Personally interpreted the patient's CT of the head is negative for acute gross intracranial hemorrhage, refer to radiologist  read for more details than   CT Head Wo Contrast  Result Date: 01/01/2022 CLINICAL DATA:  Neuro deficit, acute stroke suspected. Slurred speech for 2 days. EXAM: CT HEAD WITHOUT CONTRAST TECHNIQUE: Contiguous axial images were obtained from the base of the skull through the vertex without intravenous contrast. RADIATION DOSE REDUCTION: This exam was performed according to the departmental dose-optimization program which includes automated exposure control, adjustment of the mA and/or kV according to patient size and/or use of iterative reconstruction technique. COMPARISON:  None Available. FINDINGS: Brain: Hypodensity in the left frontal lobe suggesting recent infarction. No evidence of acute intracranial hemorrhage or mass. No midline shift. Low attenuation of periventricular and subcortical white matter presumed chronic microvascular ischemic changes. Vascular: No hyperdense vessel  or unexpected calcification. Skull: Normal. Negative for fracture or focal lesion. Sinuses/Orbits: No acute finding. Other: None. IMPRESSION: 1. Low-density in the left frontal lobe suggesting recent infarction, further evaluation with MR examination would be helpful. No evidence of acute intracranial hemorrhage. 2. Chronic microvascular ischemic changes of the white matter. Electronically Signed   By: Keane Police D.O.   On: 01/01/2022 16:22    {USE THE WORD "INTERPRETED"!! You MUST document your own interpretation of imaging, as well as the fact that you reviewed the radiologist's report!:1}   PROCEDURES:  Critical Care performed: {CriticalCareYesNo:19197::"Yes, see critical care procedure note(s)","No"}  Procedures   MEDICATIONS ORDERED IN ED: Medications  aspirin chewable tablet 324 mg (has no administration in time range)     IMPRESSION / MDM / ASSESSMENT AND PLAN / ED COURSE  I reviewed the triage vital signs and the nursing notes.                              Differential diagnosis includes, but is not limited to, ***  Patient's presentation is most consistent with {EM COPA:27473}  {If the patient is on the monitor, remove the brackets and asterisks on the sentence below and remember to document it as a Procedure as well. Otherwise delete the sentence below:1} {**The patient is on the cardiac monitor to evaluate for evidence of arrhythmia and/or significant heart rate changes.**} {Remember to include, when applicable, any/all of the following data: independent review of imaging independent review of labs (comment specifically on pertinent positives and negatives) review of specific prior hospitalizations, PCP/specialist notes, etc. discuss meds given and prescribed document any discussion with consultants (including hospitalists) any clinical decision tools you used and why (PECARN, NEXUS, etc.) did you consider admitting the patient? document social determinants of health  affecting patient's care (homelessness, inability to follow up in a timely fashion, etc) document any pre-existing conditions increasing risk on current visit (e.g. diabetes and HTN increasing danger of high-risk chest pain/ACS) describes what meds you gave (especially parenteral) and why any other interventions?:1}     FINAL CLINICAL IMPRESSION(S) / ED DIAGNOSES   Final diagnoses:  None     Rx / DC Orders   ED Discharge Orders     None        Note:  This document was prepared using Dragon voice recognition software and may include unintentional dictation errors.

## 2022-01-01 NOTE — Assessment & Plan Note (Addendum)
Chronic anticoagulation Resume apixaban and continue carvedilol

## 2022-01-01 NOTE — Assessment & Plan Note (Signed)
No acute issues suspected 

## 2022-01-01 NOTE — ED Notes (Signed)
Pt to ED via POV. Pt states that he had a procedure done with Dr. Clayborn Bigness on Monday. Pt states that he was discharged at 3pm on Monday and started having symptoms when he was discharged. Pt does not appear to have slurred speech at this time.

## 2022-01-01 NOTE — H&P (Signed)
History and Physical    Patient: [name] SAY:[id number] DOB: March 23, 1946 DOA: 01/01/2022 DOS: the patient was seen and examined on 01/01/2022 PCP: [name], NP  Patient coming from: Home  Chief Complaint:  Chief Complaint  Patient presents with   Cerebrovascular Accident   Neurologic Problem    HPI: Gary TOOPS Sr. is a 76 y.o. male with medical history significant for DM, HTN, PAF on Eliquis, s/p PPM, prostate cancer, CAD s/p cardiac cath on 8/28 for which she was off Eliquis for a few days prior who presents to the ED with difficulty with speech and word finding that started immediately after his cath on 8/28.  He denies visual disturbance, headache or one-sided weakness numbness or tingling.  Denies chest pain or shortness of breath. ED course and data review: On arrival with BP 166/96 with otherwise normal vitals.  Blood work unremarkable except for first troponin of 63.  EKG, personally viewed and interpreted showing ventricular paced rhythm at 65 with few PVCs CT head shows the following:: IMPRESSION: 1. Low-density in the left frontal lobe suggesting recent infarction, further evaluation with MR examination would be helpful. No evidence of acute intracranial hemorrhage. 2. Chronic microvascular ischemic changes of the white matter.  Patient given a chewable aspirin.  Hospitalist consulted for admission.   Review of Systems: As mentioned in the history of present illness. All other systems reviewed and are negative.  Past Medical History:  Diagnosis Date   Diabetes mellitus without complication (HCC)    GERD (gastroesophageal reflux disease)    Hyperlipidemia    Hypertension    Past Surgical History:  Procedure Laterality Date   CARDIAC CATHETERIZATION     COLONOSCOPY WITH PROPOFOL N/A 01/24/2020   Procedure: COLONOSCOPY WITH PROPOFOL;  Surgeon: [name], MD;  Location[facility] ENDOSCOPY;  Service: Gastroenterology;  Laterality: N/A;   CORONARY/GRAFT  ACUTE MI REVASCULARIZATION N/A 05/02/2018   Procedure: Coronary/Graft Acute MI Revascularization;  Surgeon: [name], MD;  Location: [place] CV LAB;  Service: Cardiovascular;  Laterality: N/A;   HERNIA REPAIR     LEFT HEART CATH AND CORONARY ANGIOGRAPHY N/A 05/02/2018   Procedure: LEFT HEART CATH AND CORONARY ANGIOGRAPHY;  Surgeon: [name], MD;  Location: [place] [facility];  Service: Cardiovascular;  Laterality: N/A;   PACEMAKER INSERTION Left 05/03/2018   Procedure: INSERTION PACEMAKER;  Surgeon: [name], MD;  Location: [facility] ORS;  Service: Cardiovascular;  Laterality: Left;   RIGHT/LEFT HEART CATH AND CORONARY ANGIOGRAPHY Bilateral 12/30/2021   Procedure: RIGHT/LEFT HEART CATH AND CORONARY ANGIOGRAPHY;  Surgeon: [name], MD;  Location: [place] [facility];  Service: Cardiovascular;  Laterality: Bilateral;   TEMPORARY PACEMAKER N/A 05/02/2018   Procedure: TEMPORARY PACEMAKER;  Surgeon: [name], MD;  Location: [place] [facility];  Service: Cardiovascular;  Laterality: N/A;   TEMPORARY PACEMAKER Right 05/03/2018   Procedure: TEMPORARY PACEMAKER REMOVAL;  Surgeon: [name], MD;  Location: [facility] ORS;  Service: Cardiovascular;  Laterality: Right;   TONSILLECTOMY Bilateral    1992   Social History:  reports that he has quit smoking. His smoking use included cigarettes. He has never used smokeless tobacco. He reports that he does not drink alcohol and does not use drugs.  No Known Allergies  Family History  Problem Relation Age of Onset   Cirrhosis Father    Bone cancer Mother     Prior to Admission medications   Medication Sig Start Date End Date Taking? Authorizing Provider  allopurinol (ZYLOPRIM) 300 MG tablet Take 1 tablet (300 mg total) by mouth daily. 11/13/21  Yes Abernathy, Yetta Flock, NP  apixaban (ELIQUIS) 5 MG TABS tablet Take by mouth. 03/27/21  Yes [provider]  carvedilol (COREG) 25 MG tablet  Take 25 mg by mouth 2 (two) times daily. 04/11/21  Yes [provider]  colchicine 0.6 MG tablet FOR ACUTE GOUT ATTACK, TAKE 2 TABLETS NOW THEN TAKE 1 MORE TABLET 1 HOUR LATER ON THE FIRST DAY. THEN TAKE 1 TABLET DAILY UNTIL FLARE UP RESOLVES. 12/19/21  Yes Abernathy, Yetta Flock, NP  esomeprazole (NEXIUM) 40 MG capsule Take 1 capsule (40 mg total) by mouth daily at 12 noon. 08/16/21  Yes Abernathy, Yetta Flock, NP  furosemide (LASIX) 40 MG tablet Take by mouth. 10/31/21  Yes [provider]  losartan-hydrochlorothiazide (HYZAAR) 100-12.5 MG tablet Take 1 tablet by mouth daily. 08/16/21  Yes Abernathy, Alyssa, NP  potassium chloride SA (KLOR-CON) 20 MEQ tablet Take 1 tablet by mouth daily. 12/13/19  Yes [provider]  tamsulosin (FLOMAX) 0.4 MG CAPS capsule Take 1 capsule (0.4 mg total) by mouth daily. 08/16/21  Yes Abernathy, Yetta Flock, NP  Accu-Chek Softclix Lancets lancets Use as instructed twice a day DX E11.65 07/30/20   Luiz Ochoa, NP  albuterol (VENTOLIN HFA) 108 (90 Base) MCG/ACT inhaler INHALE 2 INHALATIONS INTO THE LUNGS EVERY 4 HOURS AS NEEDED FOR WHEEZE OR FOR SHORTNESS OF BREATH 12/24/21   [provider]  aspirin EC 81 MG tablet Take 81 mg by mouth daily. Swallow whole. Patient not taking: Reported on 12/30/2021    [provider]  glucose blood (ACCU-CHEK GUIDE) test strip Use as directed  twice a day Dx E11.65 07/30/20   Luiz Ochoa, NP  metFORMIN (GLUCOPHAGE-XR) 750 MG 24 hr tablet Take 1 tablet (750 mg total) by mouth daily with breakfast. 11/13/21   [name], NP  metoprolol succinate (TOPROL-XL) 50 MG 24 hr tablet Take 1 tablet (50 mg total) by mouth daily. Patient not taking: Reported on 12/30/2021 08/16/21   [name], NP  simvastatin (ZOCOR) 10 MG tablet Take 1 tablet (10 mg total) by mouth daily. Patient not taking: Reported on 12/30/2021 08/16/21   [name], NP    Physical Exam: Vitals:   01/01/22 1554 01/01/22 2044  01/01/22 2130  BP: (!) 166/96 (!) 162/99 (!) 155/98  Pulse: 69 73 61  Resp: '17 18 15  '$ Temp: 98 F (36.7 C)    TempSrc: Oral    SpO2: 98% 97% 95%  Weight: 111.2 kg    Height: '6\' 3"'$  (1.905 m)     Physical Exam Vitals and nursing note reviewed.  Constitutional:      General: He is not in acute distress. HENT:     Head: Normocephalic and atraumatic.  Cardiovascular:     Rate and Rhythm: Normal rate and regular rhythm.     Heart sounds: Normal heart sounds.  Pulmonary:     Effort: Pulmonary effort is normal.     Breath sounds: Normal breath sounds.  Abdominal:     Palpations: Abdomen is soft.     Tenderness: There is no abdominal tenderness.  Neurological:     Mental Status: He is alert and oriented to person, place, and time.     Cranial Nerves: Dysarthria present.     Motor: No weakness.     Labs on Admission: I have personally reviewed following labs and imaging studies  CBC: Recent Labs  Lab 01/01/22 1638  WBC  5.2  NEUTROABS 2.9  HGB 13.8  HCT 41.9  MCV 79.7*  PLT 474   Basic Metabolic Panel: Recent Labs  Lab 01/01/22 1638  NA 138  K 3.6  CL 104  CO2 26  GLUCOSE 120*  BUN 14  CREATININE 0.90  CALCIUM 9.7   GFR: Estimated Creatinine Clearance: 92.6 mL/min (by C-G formula based on SCr of 0.9 mg/dL). Liver Function Tests: Recent Labs  Lab 01/01/22 1638  AST 20  ALT 17  ALKPHOS 77  BILITOT 0.5  PROT 7.8  ALBUMIN 4.1   No results for input(s): "LIPASE", "AMYLASE" in the last 168 hours. No results for input(s): "AMMONIA" in the last 168 hours. Coagulation Profile: Recent Labs  Lab 01/01/22 1638  INR 1.1   Cardiac Enzymes: No results for input(s): "CKTOTAL", "CKMB", "CKMBINDEX", "TROPONINI" in the last 168 hours. BNP (last 3 results) No results for input(s): "PROBNP" in the last 8760 hours. HbA1C: No results for input(s): "HGBA1C" in the last 72 hours. CBG: Recent Labs  Lab 12/30/21 1114 12/30/21 1340  GLUCAP 151* 125*   Lipid  Profile: No results for input(s): "CHOL", "HDL", "LDLCALC", "TRIG", "CHOLHDL", "LDLDIRECT" in the last 72 hours. Thyroid Function Tests: No results for input(s): "TSH", "T4TOTAL", "FREET4", "T3FREE", "THYROIDAB" in the last 72 hours. Anemia Panel: No results for input(s): "VITAMINB12", "FOLATE", "FERRITIN", "TIBC", "IRON", "RETICCTPCT" in the last 72 hours. Urine analysis:    Component Value Date/Time   COLORURINE YELLOW (A) 06/12/2020 0127   APPEARANCEUR Clear 08/16/2021 1019   LABSPEC 1.030 06/12/2020 0127   PHURINE 6.0 06/12/2020 0127   GLUCOSEU Negative 08/16/2021 1019   HGBUR NEGATIVE 06/12/2020 0127   BILIRUBINUR Negative 08/16/2021 1019   KETONESUR NEGATIVE 06/12/2020 0127   PROTEINUR Negative 08/16/2021 1019   PROTEINUR NEGATIVE 06/12/2020 0127   NITRITE Negative 08/16/2021 1019   NITRITE NEGATIVE 06/12/2020 0127   LEUKOCYTESUR Negative 08/16/2021 1019   LEUKOCYTESUR NEGATIVE 06/12/2020 0127    Radiological Exams on Admission: CT Head Wo Contrast  Result Date: 01/01/2022 CLINICAL DATA:  Neuro deficit, acute stroke suspected. Slurred speech for 2 days. EXAM: CT HEAD WITHOUT CONTRAST TECHNIQUE: Contiguous axial images were obtained from the base of the skull through the vertex without intravenous contrast. RADIATION DOSE REDUCTION: This exam was performed according to the departmental dose-optimization program which includes automated exposure control, adjustment of the mA and/or kV according to patient size and/or use of iterative reconstruction technique. COMPARISON:  None Available. FINDINGS: Brain: Hypodensity in the left frontal lobe suggesting recent infarction. No evidence of acute intracranial hemorrhage or mass. No midline shift. Low attenuation of periventricular and subcortical white matter presumed chronic microvascular ischemic changes. Vascular: No hyperdense vessel or unexpected calcification. Skull: Normal. Negative for fracture or focal lesion. Sinuses/Orbits: No  acute finding. Other: None. IMPRESSION: 1. Low-density in the left frontal lobe suggesting recent infarction, further evaluation with MR examination would be helpful. No evidence of acute intracranial hemorrhage. 2. Chronic microvascular ischemic changes of the white matter. Electronically Signed   By: Keane Police D.O.   On: 01/01/2022 16:22     Data Reviewed: Relevant notes from primary care and specialist visits, past discharge summaries as available in EHR, including Care Everywhere. Prior diagnostic testing as pertinent to current admission diagnoses Updated medications and problem lists for reconciliation ED course, including vitals, labs, imaging, treatment and response to treatment Triage notes, nursing and pharmacy notes and ED provider's notes Notable results as noted in HPI   Assessment and Plan: * Acute CVA,  post procedural(cerebrovascular accident) [facility]) Patient presented outside the tPA window Was off anticoagulation for a few days prior to cardiac cath CT head shows Low-density in the left frontal lobe suggesting recent infarction Received aspirin in the emergency room We will resume patient's apixaban that was discontinued prior to his cath on 8/28 Continue simvastatin Stroke work-up to include echo, continuous cardiac monitoring and carotid Doppler Avoid dextrose containing fluids, Maintain euglycemia, euthermia Neuro checks q4 hrs x 24 hrs and then per shift Physical therapy/Occupational therapy/Speech therapy if failed dysphagia screen Neurology consult to follow   PAF (paroxysmal atrial fibrillation) (HCC) Chronic anticoagulation Resume apixaban and continue carvedilol  History of cardiac pacemaker No acute issues suspected  Diabetes mellitus without complication ([place]) Sliding scale insulin coverage  Coronary artery disease involving native heart S/p cardiac cath 8/28.  Results unavailable at this time Continue simvastatin and carvedilol  Essential  hypertension Continue carvedilol, losartan/HCTZ        DVT prophylaxis: Eliquis  Consults: Neurology  Advance Care Planning:   Code Status: Prior   Family Communication: none  Disposition Plan: Back to previous home environment  Severity of Illness: The appropriate patient status for this patient is OBSERVATION. Observation status is judged to be reasonable and necessary in order to provide the required intensity of service to ensure the patient's safety. The patient's presenting symptoms, physical exam findings, and initial radiographic and laboratory data in the context of their medical condition is felt to place them at decreased risk for further clinical deterioration. Furthermore, it is anticipated that the patient will be medically stable for discharge from the hospital within 2 midnights of admission.   Author: Athena Masse, MD 01/01/2022 10:22 PM  For on call review [email or web address].

## 2022-01-02 ENCOUNTER — Observation Stay
Admit: 2022-01-02 | Discharge: 2022-01-02 | Disposition: A | Discharge status: Home / Self Care | Payer: Medicare HMO | Attending: Internal Medicine | Admitting: Internal Medicine

## 2022-01-02 DIAGNOSIS — I6389 Other cerebral infarction: Secondary | ICD-10-CM | POA: Diagnosis not present

## 2022-01-02 DIAGNOSIS — Z7901 Long term (current) use of anticoagulants: Secondary | ICD-10-CM | POA: Diagnosis not present

## 2022-01-02 DIAGNOSIS — I639 Cerebral infarction, unspecified: Secondary | ICD-10-CM | POA: Diagnosis not present

## 2022-01-02 DIAGNOSIS — E782 Mixed hyperlipidemia: Secondary | ICD-10-CM | POA: Diagnosis not present

## 2022-01-02 LAB — ECHOCARDIOGRAM COMPLETE
AR max vel: 2.09 cm2
AV Area VTI: 2.14 cm2
AV Area mean vel: 2.07 cm2
AV Mean grad: 2 mmHg
AV Peak grad: 3.5 mmHg
Ao pk vel: 0.93 m/s
Area-P 1/2: 4.41 cm2
Height: 75 in
S' Lateral: 3.73 cm
Weight: 3922.42 oz

## 2022-01-02 LAB — CBG MONITORING, ED
Glucose-Capillary: 170 mg/dL — ABNORMAL HIGH (ref 70–99)
Glucose-Capillary: 145 mg/dL — ABNORMAL HIGH (ref 70–99)

## 2022-01-02 LAB — LIPID PANEL
Cholesterol: 235 mg/dL — ABNORMAL HIGH (ref 0–200)
HDL: 54 mg/dL (ref >40)
LDL Cholesterol: 123 mg/dL — ABNORMAL HIGH (ref 0–99)
Total CHOL/HDL Ratio: 4.4 RATIO
Triglycerides: 291 mg/dL — ABNORMAL HIGH (ref <150)
VLDL: 58 mg/dL — ABNORMAL HIGH (ref 0–40)

## 2022-01-02 MED ORDER — APIXABAN 5 MG PO TABS: 5.0000 mg | ORAL_TABLET | Freq: Two times a day (BID) | ORAL | 0 refills | Status: AC

## 2022-01-02 MED ORDER — ATORVASTATIN CALCIUM 40 MG PO TABS: 40.0000 mg | ORAL_TABLET | Freq: Every day | ORAL | 0 refills | Status: DC

## 2022-01-02 MED ORDER — APIXABAN 5 MG PO TABS
5.0000 mg | ORAL_TABLET | Freq: Two times a day (BID) | ORAL | Status: DC
  Administered 2022-01-02: 5 mg via ORAL
  Filled 2022-01-02: qty 1

## 2022-01-02 MED ORDER — SIMVASTATIN 40 MG PO TABS: 40.0000 mg | ORAL_TABLET | Freq: Every day | ORAL | 0 refills | Status: DC

## 2022-01-02 NOTE — Evaluation (Signed)
Speech Language Pathology Evaluation Patient Details Name: Gary Gary Sr. MRN: [id number] DOB: [birth year] Today's Date: 01/02/2022 Time: 5277-8242 SLP Time Calculation (min) (ACUTE ONLY): 15 min  Problem List:  Patient Active Problem List   Diagnosis Date Noted   Mixed hyperlipidemia    Acute ischemic stroke ([place]) 01/01/2022   Diabetes mellitus without complication (HCC)    PAF (paroxysmal atrial fibrillation) (HCC)    Chronic anticoagulation    History of cardiac pacemaker    History of prostate cancer    Cellulitis 06/11/2020   Screening for colon cancer 11/09/2019   Sebaceous cyst of ear 11/09/2019   Encounter for general adult medical examination with abnormal findings 06/23/2019   Gout 06/23/2019   Other fatigue 05/14/2019   Type 2 diabetes mellitus with hyperglycemia ([place]) 12/13/2018   Atopic dermatitis 12/13/2018   Coronary artery disease involving native heart 05/23/2018   Right upper quadrant abdominal pain 05/23/2018   Bradycardia 04/30/2018   Essential hypertension 05/18/2017   Gastroesophageal reflux disease without esophagitis 05/18/2017   Prostate cancer ([place]) 06/26/2016   Anejaculation 10/18/2012   Erectile dysfunction following radiation therapy 10/18/2012   Microscopic hematuria 04/19/2012   Testicular hypofunction 04/19/2012   Past Medical History:  Past Medical History:  Diagnosis Date   Diabetes mellitus without complication ([place])    GERD (gastroesophageal reflux disease)    Hyperlipidemia    Hypertension    Past Surgical History:  Past Surgical History:  Procedure Laterality Date   CARDIAC CATHETERIZATION     COLONOSCOPY WITH PROPOFOL N/A 01/24/2020   Procedure: COLONOSCOPY WITH PROPOFOL;  Surgeon: [name], MD;  Location: [facility] ENDOSCOPY;  Service: Gastroenterology;  Laterality: N/A;   CORONARY/GRAFT ACUTE MI REVASCULARIZATION N/A 05/02/2018   Procedure: Coronary/Graft Acute MI Revascularization;  Surgeon: [name], MD;  Location:  [place] [facility];  Service: Cardiovascular;  Laterality: N/A;   HERNIA REPAIR     LEFT HEART CATH AND CORONARY ANGIOGRAPHY N/A 05/02/2018   Procedure: LEFT HEART CATH AND CORONARY ANGIOGRAPHY;  Surgeon: [name], MD;  Location: [place] [facility];  Service: Cardiovascular;  Laterality: N/A;   PACEMAKER INSERTION Left 05/03/2018   Procedure: INSERTION PACEMAKER;  Surgeon: [name], MD;  Location: [facility] ORS;  Service: Cardiovascular;  Laterality: Left;   RIGHT/LEFT HEART CATH AND CORONARY ANGIOGRAPHY Bilateral 12/30/2021   Procedure: RIGHT/LEFT HEART CATH AND CORONARY ANGIOGRAPHY;  Surgeon: [name], MD;  Location: [place] [facility];  Service: Cardiovascular;  Laterality: Bilateral;   TEMPORARY PACEMAKER N/A 05/02/2018   Procedure: TEMPORARY PACEMAKER;  Surgeon: [name], MD;  Location: [place] [facility];  Service: Cardiovascular;  Laterality: N/A;   TEMPORARY PACEMAKER Right 05/03/2018   Procedure: TEMPORARY PACEMAKER REMOVAL;  Surgeon: [name], MD;  Location: [facility] ORS;  Service: Cardiovascular;  Laterality: Right;   TONSILLECTOMY Bilateral    1992   HPI:  Gary URBAS Sr. is a 76 y.o. male with medical history significant for DM, HTN, PAF on Eliquis, s/p PPM, prostate cancer, CAD s/p cardiac cath on 8/28 for which she was off Eliquis for a few days prior who presents to the ED with difficulty with speech and word finding that started immediately after his cath on 8/28.  He denies visual disturbance, headache or one-sided weakness numbness or tingling.  Denies chest pain or shortness of breath.  ED course and data review: On arrival with BP 166/96 with otherwise normal vitals.  Blood work unremarkable except for first troponin of 63.  EKG showing ventricular paced rhythm at 65 with few PVCs CT head shows the following::  IMPRESSION:  1. Low-density in the left frontal lobe suggesting recent  infarction, further evaluation with MR  examination would be helpful.  No evidence of acute intracranial hemorrhage.  2. Chronic microvascular ischemic changes of the white matter.   Assessment / Plan / Recommendation Clinical Impression  Pt presents with acute expressive language impairment in the setting of suspected left frontal lobe infarct (as indicated by Head CT). Pt verbal expression is fluent, with pt participating in conversational level tasks. Speech is notable for intermittent anomic hesitations and semantic paraphasias, most notable for narrative and descriptive tasks. Potential confounding factor of thought organization noted with generative naming task. Speech is grossly intelligible across tasks. Receptive language intact for complex questions and directions. Graphic expression and reading comprehension to be further tested. Pt endorsed awareness and frustration for change in verbal expression. Education shared for factors that impact language (fatigue and familiarity with topic) and recommendations for OP speech therapy. Pt and daughter endorsed understanding across education. Based on high level of language ability, independent ability to make needs known in the room, and reported understanding for education, no further acute SLP services indicated; however, recommend follow up at discharge (OP vs HH). RN and MD aware of recommendations.    SLP Assessment  SLP Recommendation/Assessment: All further Speech Lanaguage Pathology  needs can be addressed in the next venue of care SLP Visit Diagnosis: Aphasia (R47.01)    Recommendations for follow up therapy are one component of a multi-disciplinary discharge planning process, led by the attending physician.  Recommendations may be updated based on patient status, additional functional criteria and insurance authorization.    Follow Up Recommendations  Outpatient SLP    Assistance Recommended at Discharge  Intermittent Supervision/Assistance (in the setting of communication  breakdown)  Functional Status Assessment Patient has had a recent decline in their functional status and demonstrates the ability to make significant improvements in function in a reasonable and predictable amount of time.  Frequency and Duration           SLP Evaluation Cognition  Overall Cognitive Status: Impaired/Different from baseline Arousal/Alertness: Awake/alert Orientation Level: Oriented X4 Attention: Sustained Sustained Attention: Appears intact Memory:  (needs further assessment) Awareness: Appears intact Problem Solving:  (needs further assessment) Executive Function: Reasoning;Organizing Reasoning: Appears intact Organizing: Impaired Organizing Impairment: Verbal basic (as indicated with generative naming task) Safety/Judgment: Appears intact       Comprehension  Auditory Comprehension Overall Auditory Comprehension: Appears within functional limits for tasks assessed (3 step commands WFL; open ended questions WFL) Visual Recognition/Discrimination Discrimination: Not tested Reading Comprehension Reading Status: Not tested    Expression Expression Primary Mode of Expression: Verbal Verbal Expression Overall Verbal Expression: Impaired Initiation: No impairment Automatic Speech: Social Response Level of Generative/Spontaneous Verbalization: Conversation Naming: Impairment Responsive: 76-100% accurate Confrontation: Within functional limits Convergent: Not tested Divergent: Not tested Other Naming Comments: Generative Naming (+12 in one minute) slightly below normal limits. Verbal Errors: Aware of errors;Other (comment);Phonemic paraphasias (anomic hesitations) Pragmatics: No impairment Interfering Components:  (none) Effective Techniques: Semantic cues;Open ended questions Non-Verbal Means of Communication:  (none trialed) Written Expression Written Expression: Not tested   Oral / Motor  Oral Motor/Sensory Function Overall Oral Motor/Sensory Function:  Within functional limits Motor Speech Overall Motor Speech: Appears within functional limits for tasks assessed           Martin[place] J Clapp MS CCC-SLP Martin[place] J Clapp 01/02/2022, 12:07  PM

## 2022-01-02 NOTE — Progress Notes (Signed)
*  PRELIMINARY RESULTS* Echocardiogram 2D Echocardiogram has been performed.  [name] 01/02/2022, 9:37 AM

## 2022-01-02 NOTE — Evaluation (Signed)
Occupational Therapy Evaluation Patient Details Name: [name]. MRN: [id number] DOB: [birth year] Today's Date: 01/02/2022   History of Present Illness 76 y.o. male with medical history significant for DM, HTN, PAF on Eliquis, s/p PPM, prostate cancer, CAD s/p cardiac cath on 8/28 for which she was off Eliquis for a few days prior who presents to the ED with difficulty with speech and word finding that started immediately after his cath on 8/28.   Clinical Impression   Pt seen for OT evaluation this date. Prior to hospital admission, pt was independent in all aspects of ADL/IADL and working as a Software engineer. Pt lives with his great grand kids in a 1 story home with 1 step and R post to enter. No falls. Pt reports only symptom is word finding difficulties that has not improved or worsened since his R/L heart catheterization. Word finding mild, worse with confrontation. Pt demonstrates baseline independence to perform ADL and mobility tasks and no strength, sensory, coordination, cognitive, or visual deficits appreciated with assessment. No skilled OT needs identified. Will sign off. Please re-consult if additional OT needs arise.     Recommendations for follow up therapy are one component of a multi-disciplinary discharge planning process, led by the attending physician.  Recommendations may be updated based on patient status, additional functional criteria and insurance authorization.   Follow Up Recommendations  No OT follow up    Assistance Recommended at Discharge None  Patient can return home with the following      Functional Status Assessment  Patient has not had a recent decline in their functional status  Equipment Recommendations  None recommended by OT    Recommendations for Other Services       Precautions / Restrictions Precautions Precautions: None Restrictions Weight Bearing Restrictions: No      Mobility Bed Mobility Overal bed mobility: Modified Independent                   Transfers Overall transfer level: Independent                        Balance Overall balance assessment: No apparent balance deficits (not formally assessed)                                         ADL either performed or assessed with clinical judgement   ADL Overall ADL's : Independent;At baseline                                             Vision Baseline Vision/History: 0 No visual deficits       Perception     Praxis      Pertinent Vitals/Pain Pain Assessment Pain Assessment: No/denies pain     Hand Dominance     Extremity/Trunk Assessment Upper Extremity Assessment Upper Extremity Assessment: Overall WFL for tasks assessed   Lower Extremity Assessment Lower Extremity Assessment: Overall WFL for tasks assessed   Cervical / Trunk Assessment Cervical / Trunk Assessment: Normal   Communication     Cognition Arousal/Alertness: Awake/alert Behavior During Therapy: WFL for tasks assessed/performed Overall Cognitive Status: Within Functional Limits for tasks assessed  General Comments: some minor word finding difficulties still     General Comments       Exercises     Shoulder Instructions      Home Living Family/patient expects to be discharged to:: Private residence Living Arrangements: Other relatives (great grandkids) Available Help at Discharge: Family;Available 24 hours/day Type of Home: House Home Access: Stairs to enter [employer] of Steps: 1 Entrance Stairs-Rails: Right Home Layout: One level     Bathroom Shower/Tub: Occupational psychologist: Handicapped height     Home Equipment: None          Prior Functioning/Environment Prior Level of Function : Independent/Modified Independent;Working/employed;Driving             Mobility Comments: no AD ADLs Comments: Indep, works as a Public librarian: Other (comment) (word finding)      OT Treatment/Interventions:      OT Goals(Current goals can be found in the care plan section) Acute Rehab OT Goals Patient Stated Goal: go home OT Goal Formulation: All assessment and education complete, DC therapy  OT Frequency:      Co-evaluation              AM-PAC OT "6 Clicks" Daily Activity     Outcome Measure Help from another person eating meals?: None Help from another person taking care of personal grooming?: None Help from another person toileting, which includes using toliet, bedpan, or urinal?: None Help from another person bathing (including washing, rinsing, drying)?: None Help from another person to put on and taking off regular upper body clothing?: None Help from another person to put on and taking off regular lower body clothing?: None 6 Click Score: 24   End of Session Equipment Utilized During Treatment: Gait belt Nurse Communication: Mobility status  Activity Tolerance: Patient tolerated treatment well Patient left: in bed;with call bell/phone within reach;with family/visitor present (seated EOB with SLP)  OT Visit Diagnosis: Cognitive communication deficit (R41.841) Symptoms and signs involving cognitive functions: Cerebral infarction                Time: 0177-9390 OT Time Calculation (min): 10 min Charges:  OT General Charges $OT Visit: 1 Visit OT Evaluation $OT Eval Low Complexity: 1 Low  Ardeth Perfect., MPH, MS, OTR/L ascom [phone] 01/02/22, 11:28 AM

## 2022-01-13 DIAGNOSIS — I480 Paroxysmal atrial fibrillation: Secondary | ICD-10-CM | POA: Diagnosis not present

## 2022-01-13 DIAGNOSIS — I639 Cerebral infarction, unspecified: Secondary | ICD-10-CM | POA: Diagnosis not present

## 2022-01-13 DIAGNOSIS — I5031 Acute diastolic (congestive) heart failure: Secondary | ICD-10-CM | POA: Diagnosis not present

## 2022-01-13 DIAGNOSIS — Z950 Presence of cardiac pacemaker: Secondary | ICD-10-CM | POA: Diagnosis not present

## 2022-01-13 DIAGNOSIS — I272 Pulmonary hypertension, unspecified: Secondary | ICD-10-CM | POA: Diagnosis not present

## 2022-01-13 DIAGNOSIS — R079 Chest pain, unspecified: Secondary | ICD-10-CM | POA: Diagnosis not present

## 2022-01-13 DIAGNOSIS — I872 Venous insufficiency (chronic) (peripheral): Secondary | ICD-10-CM | POA: Diagnosis not present

## 2022-01-13 DIAGNOSIS — E782 Mixed hyperlipidemia: Secondary | ICD-10-CM | POA: Diagnosis not present

## 2022-01-13 DIAGNOSIS — I495 Sick sinus syndrome: Secondary | ICD-10-CM | POA: Diagnosis not present

## 2022-01-15 ENCOUNTER — Ambulatory Visit (INDEPENDENT_AMBULATORY_CARE_PROVIDER_SITE_OTHER): Payer: Medicare HMO | Admitting: Nurse Practitioner

## 2022-01-15 ENCOUNTER — Encounter: Payer: Self-pay | Admitting: Nurse Practitioner

## 2022-01-15 VITALS — BP 127/84 | HR 85 | Temp 97.9°F | Resp 16 | Ht 75.5 in | Wt 251.2 lb

## 2022-01-15 DIAGNOSIS — E782 Mixed hyperlipidemia: Secondary | ICD-10-CM | POA: Diagnosis not present

## 2022-01-15 DIAGNOSIS — R0602 Shortness of breath: Secondary | ICD-10-CM | POA: Diagnosis not present

## 2022-01-15 DIAGNOSIS — I10 Essential (primary) hypertension: Secondary | ICD-10-CM | POA: Diagnosis not present

## 2022-01-15 DIAGNOSIS — Z09 Encounter for follow-up examination after completed treatment for conditions other than malignant neoplasm: Secondary | ICD-10-CM

## 2022-01-15 DIAGNOSIS — E119 Type 2 diabetes mellitus without complications: Secondary | ICD-10-CM

## 2022-01-15 DIAGNOSIS — Z23 Encounter for immunization: Secondary | ICD-10-CM

## 2022-01-15 DIAGNOSIS — Z8673 Personal history of transient ischemic attack (TIA), and cerebral infarction without residual deficits: Secondary | ICD-10-CM

## 2022-01-15 DIAGNOSIS — I480 Paroxysmal atrial fibrillation: Secondary | ICD-10-CM

## 2022-01-15 MED ORDER — LOSARTAN POTASSIUM-HCTZ 100-12.5 MG PO TABS: 1.0000 | ORAL_TABLET | Freq: Every day | ORAL | 3 refills | Status: DC

## 2022-01-15 NOTE — Progress Notes (Signed)
[facility] [address] [place] [address] [facility] Discharge Acute Issues Care Follow Up                                                                        Patient Demographics  [name]., is a 76 y.o. male  DOB 1945/05/07  MRN [id number].  Primary MD  [name], NP   Reason for TCC follow Up - acute ischemic stroke   Past Medical History:  Diagnosis Date   Diabetes mellitus without complication ([place])    GERD (gastroesophageal reflux disease)    Hyperlipidemia    Hypertension     Past Surgical History:  Procedure Laterality Date   CARDIAC CATHETERIZATION     COLONOSCOPY WITH PROPOFOL N/A 01/24/2020   Procedure: COLONOSCOPY WITH PROPOFOL;  Surgeon: [name], MD;  Location: [facility] ENDOSCOPY;  Service: Gastroenterology;  Laterality: N/A;   CORONARY/GRAFT ACUTE MI REVASCULARIZATION N/A 05/02/2018   Procedure: Coronary/Graft Acute MI Revascularization;  Surgeon: [name], MD;  Location: [place] CV LAB;  Service: Cardiovascular;  Laterality: N/A;   HERNIA REPAIR     LEFT HEART CATH AND CORONARY ANGIOGRAPHY N/A 05/02/2018   Procedure: LEFT HEART CATH AND CORONARY ANGIOGRAPHY;  Surgeon: [name], MD;  Location: [place] [facility];  Service: Cardiovascular;  Laterality: N/A;   PACEMAKER INSERTION Left 05/03/2018   Procedure: INSERTION PACEMAKER;  Surgeon: [name], MD;  Location: [facility] ORS;  Service: Cardiovascular;  Laterality: Left;   RIGHT/LEFT HEART CATH AND CORONARY ANGIOGRAPHY Bilateral 12/30/2021   Procedure: RIGHT/LEFT HEART CATH AND CORONARY ANGIOGRAPHY;  Surgeon: [name], MD;  Location: [place] [facility];  Service: Cardiovascular;  Laterality: Bilateral;   TEMPORARY PACEMAKER N/A 05/02/2018   Procedure: TEMPORARY PACEMAKER;  Surgeon: [name], MD;  Location: [place]  CV LAB;  Service: Cardiovascular;  Laterality: N/A;   TEMPORARY PACEMAKER Right 05/03/2018   Procedure: TEMPORARY PACEMAKER REMOVAL;  Surgeon: [name], MD;  Location: [facility] ORS;  Service: Cardiovascular;  Laterality: Right;   TONSILLECTOMY Bilateral    1992       Recent HPI and Hospital Course  Hospital Course: Gary CARINO Sr. is a 76 y.o. male with medical history significant for DM, HTN, PAF on Eliquis, s/p PPM, prostate cancer, CAD s/p cardiac cath on 8/28 for which she was off Eliquis for a few days prior was admitted with speech and word finding that started immediately after his cath on 8/28.   Assessment and Plan: * Acute ischemic stroke [facility]) Patient presented outside the tPA window Was off anticoagulation for a few days prior to cardiac cath CT head shows Low-density in the left frontal lobe suggesting recent infarction - evaluated by OT, ST with no evidence of residual deficit. No skilled needs identified.  - unable to have MRI due to pacemaker. US carotid with no significant stenosis and echo wnl. - D/w neuro and recommend restarting Eliquis which is likely etio.  PAF (paroxysmal atrial fibrillation) (HCC) Chronic anticoagulation Resumed apixaban and continue carvedilol   History of cardiac pacemaker Diabetes mellitus without complication ([place]) Coronary artery disease involving native heart S/p cardiac cath 8/28.  Results unavailable at this time Continue simvastatin and carvedilol   Essential hypertension Continue carvedilol, losartan/HCTZ    Post Hospital Acute Care Issue to be followed in the Clinic   Principal Problem:   Acute ischemic stroke [facility]) Active Problems:   PAF (paroxysmal atrial fibrillation) (HCC)   Chronic anticoagulation   Essential hypertension   Coronary artery disease involving native heart   Diabetes mellitus without complication ([place])   History of cardiac pacemaker   History of prostate cancer     Subjective:    [name] today has, No headache, No chest pain, No abdominal pain - No Nausea, No new weakness tingling or numbness, No Cough - SOB. Positive for sinus pressure, fatigue and expressive aphasia. Has SOB with minimal; exertion that his cardiologist has told him is from his lungs, not his heart.   Assessment & Plan   1. Hospital discharge follow-up He will continue to follow up with cardiology and will start seeing neurology outpatient. Discussed new medications and sent refills if needed. All questions and concerns were addressed during the visit. Instructed patient to call the clinic if he has any other questions or concerns that were not addressed today.   2. History of recent stroke Will follow up with neurology, taking eliquis and atorvastatin as prescribed, BP controlled with current medications.  3. SOB (shortness of breath) on exertion Using a rescue inhaler at home and it has been relieving his SOB. No record of any respiratory testing. PFT ordered, may need consult with Dr. Devona Konig depending on PFT results.  - Pulmonary Function Test; Future  4. PAF (paroxysmal atrial fibrillation) (HCC) Taking eliquis, diltiazem and carvedilol. Also sees cardiology regularly  5. Diabetes mellitus without complication ([place]) Continue metformin as prescribed, repeat A1c in late October, if no change or increased A1C at next visit, will discuss adding another medication.   6. Essential hypertension Stable, continue antihypertensive medications as prescribed - losartan-hydrochlorothiazide (HYZAAR) 100-12.5 MG tablet; Take 1 tablet by mouth daily.  Dispense: 90 tablet; Refill: 3  7. Mixed hyperlipidemia Continue atorvastatin 40 mg daily  8. Flu vaccine need Flu vaccine administered in office today - Flu Vaccine MDCK QUAD PF   Reason for frequent admissions/ER visits **      Objective:   Vitals:   01/15/22 1118  BP: 127/84  Pulse: 85  Resp: 16  Temp: 97.9 F (36.6 C)  SpO2:  98%  Weight: 251 lb 3.2 oz (113.9 kg)  Height: 6' 3.5" (1.918 m)    Wt Readings from Last 3 Encounters:  01/15/22 251 lb 3.2 oz (113.9 kg)  01/01/22 245 lb 2.4 oz (111.2 kg)  12/30/21 245 lb (111.1 kg)    Allergies as of 01/15/2022   No Known Allergies      Medication List        Accurate as of January 15, 2022 12:12 PM. If you have any questions, ask your nurse or doctor.          Accu-Chek Guide test strip Generic drug: glucose blood Use as directed  twice a day Dx E11.65   Accu-Chek Softclix Lancets lancets Use as instructed twice a day DX E11.65   albuterol 108 (90 Base) MCG/ACT inhaler Commonly known as: VENTOLIN HFA INHALE 2 INHALATIONS INTO THE LUNGS EVERY 4 HOURS AS  NEEDED FOR WHEEZE OR FOR SHORTNESS OF BREATH   allopurinol 300 MG tablet Commonly known as: ZYLOPRIM Take 1 tablet (300 mg total) by mouth daily.   apixaban 5 MG Tabs tablet Commonly known as: ELIQUIS Take 1 tablet (5 mg total) by mouth 2 (two) times daily.   atorvastatin 40 MG tablet Commonly known as: Lipitor Take 1 tablet (40 mg total) by mouth daily.   carvedilol 25 MG tablet Commonly known as: COREG Take 25 mg by mouth 2 (two) times daily.   colchicine 0.6 MG tablet FOR ACUTE GOUT ATTACK, TAKE 2 TABLETS NOW THEN TAKE 1 MORE TABLET 1 HOUR LATER ON THE FIRST DAY. THEN TAKE 1 TABLET DAILY UNTIL FLARE UP RESOLVES.   diltiazem 240 MG 24 hr capsule Commonly known as: CARDIZEM CD Take 240 mg by mouth daily.   esomeprazole 40 MG capsule Commonly known as: NEXIUM Take 1 capsule (40 mg total) by mouth daily at 12 noon.   furosemide 20 MG tablet Commonly known as: LASIX Take 20 mg by mouth daily.   Klor-Con M10 10 MEQ tablet Generic drug: potassium chloride Take 10 mEq by mouth daily.   losartan-hydrochlorothiazide 100-12.5 MG tablet Commonly known as: HYZAAR Take 1 tablet by mouth daily.   metFORMIN 750 MG 24 hr tablet Commonly known as: GLUCOPHAGE-XR Take 1 tablet (750 mg  total) by mouth daily with breakfast.   tamsulosin 0.4 MG Caps capsule Commonly known as: FLOMAX Take 1 capsule (0.4 mg total) by mouth daily.         Physical Exam: Constitutional: Patient appears well-developed and well-nourished. Not in obvious distress. HENT: Normocephalic, atraumatic, External right and left ear normal. Oropharynx is clear and moist.  Eyes: Conjunctivae and EOM are normal. PERRLA, no scleral icterus. Neck: Normal ROM. Neck supple. No JVD. No tracheal deviation. No thyromegaly. CVS: RRR, S1/S2 +, no murmurs, no gallops, no carotid bruit.  Pulmonary: Effort and breath sounds normal, no stridor, rhonchi, wheezes, rales.  Abdominal: Soft. BS +, no distension, tenderness, rebound or guarding.  Musculoskeletal: Normal range of motion. No edema and no tenderness.  Lymphadenopathy: No lymphadenopathy noted, cervical, inguinal or axillary Neuro: Alert. Normal reflexes, muscle tone coordination. No cranial nerve deficit. Skin: Skin is warm and dry. No rash noted. Not diaphoretic. No erythema. No pallor. Psychiatric: Normal mood and affect. Behavior, judgment, thought content normal.   Data Review   Micro Results No results found for this or any previous visit (from the past 240 hour(s)).   CBC No results for input(s): "WBC", "HGB", "HCT", "PLT", "MCV", "MCH", "MCHC", "RDW", "LYMPHSABS", "MONOABS", "EOSABS", "BASOSABS", "BANDABS" in the last 168 hours.  Invalid input(s): "NEUTRABS", "BANDSABD"  Chemistries  No results for input(s): "NA", "K", "CL", "CO2", "GLUCOSE", "BUN", "CREATININE", "CALCIUM", "MG", "AST", "ALT", "ALKPHOS", "BILITOT" in the last 168 hours.  Invalid input(s): "GFRCGP" ------------------------------------------------------------------------------------------------------------------ estimated creatinine clearance is 94.3 mL/min (by C-G formula based on SCr of 0.9  mg/dL). ------------------------------------------------------------------------------------------------------------------ No results for input(s): "HGBA1C" in the last 72 hours. ------------------------------------------------------------------------------------------------------------------ No results for input(s): "CHOL", "HDL", "LDLCALC", "TRIG", "CHOLHDL", "LDLDIRECT" in the last 72 hours. ------------------------------------------------------------------------------------------------------------------ No results for input(s): "TSH", "T4TOTAL", "T3FREE", "THYROIDAB" in the last 72 hours.  Invalid input(s): "FREET3" ------------------------------------------------------------------------------------------------------------------ No results for input(s): "VITAMINB12", "FOLATE", "FERRITIN", "TIBC", "IRON", "RETICCTPCT" in the last 72 hours.  Coagulation profile No results for input(s): "INR", "PROTIME" in the last 168 hours.  No results for input(s): "DDIMER" in the last 72 hours.  Cardiac Enzymes No results for input(s): "CKMB", "TROPONINI", "MYOGLOBIN" in the  last 168 hours.  Invalid input(s): "CK" ------------------------------------------------------------------------------------------------------------------ Invalid input(s): "POCBNP"  Return in about 4 weeks (around 02/12/2022) for F/U, Abiola Behring PCP discuss PFT .   Time Spent in minutes  45 Time spent with patient included reviewing progress notes, labs, imaging studies, and discussing plan for follow up.   This patient was seen by [name], FNP-C in collaboration with Dr. Clayborn Bigness as a part of collaborative care agreement.    [name] MSN, FNP-C on 01/15/2022 at 12:12 PM   **Disclaimer: This note may have been dictated with voice recognition software. Similar sounding words can inadvertently be transcribed and this note may contain transcription errors which may not have been corrected upon publication of  note.**

## 2022-01-15 NOTE — Discharge Summary (Signed)
Physician Discharge Summary   Patient: Gary KIRALY Sr. MRN: [id number] DOB: 1945-09-17  Admit date:     01/01/2022  Discharge date: 01/02/2022  Discharge Physician: [name]   PCP: [name]   Recommendations at discharge:    F/u with outpt providers as requested  Discharge Diagnoses: Principal Problem:   Acute ischemic stroke [facility]) Active Problems:   PAF (paroxysmal atrial fibrillation) ([place])   Chronic anticoagulation   Essential hypertension   Coronary artery disease involving native heart   Diabetes mellitus without complication ([place])   History of cardiac pacemaker   History of prostate cancer  Hospital Course: Gary THUNDER Sr. is a 76 y.o. male with medical history significant for DM, HTN, PAF on Eliquis, s/p PPM, prostate cancer, CAD s/p cardiac cath on 8/28 for which she was off Eliquis for a few days prior was admitted with speech and word finding that started immediately after his cath on 8/28.  Assessment and Plan: * Acute ischemic stroke [facility]) Patient presented outside the tPA window Was off anticoagulation for a few days prior to cardiac cath CT head shows Low-density in the left frontal lobe suggesting recent infarction - evaluated by OT, ST with no evidence of residual deficit. No skilled needs identified.  - unable to have MRI due to pacemaker. US carotid with no significant stenosis and echo wnl. - D/w neuro and recommend restarting Eliquis which is likely etio.  PAF (paroxysmal atrial fibrillation) (HCC) Chronic anticoagulation Resumed apixaban and continue carvedilol  History of cardiac pacemaker Diabetes mellitus without complication ([place]) Coronary artery disease involving native heart S/p cardiac cath 8/28.  Results unavailable at this time Continue simvastatin and carvedilol  Essential hypertension Continue carvedilol, losartan/HCTZ         Disposition: Home Diet recommendation:  Discharge Diet Orders (From admission, onward)      Start     Ordered   01/02/22 0000  Diet - low sodium heart healthy        01/02/22 1202           Carb modified diet DISCHARGE MEDICATION: Allergies as of 01/02/2022   No Known Allergies      Medication List     STOP taking these medications    aspirin EC 81 MG tablet   metoprolol succinate 50 MG 24 hr tablet Commonly known as: TOPROL-XL   simvastatin 10 MG tablet Commonly known as: ZOCOR       TAKE these medications    Accu-Chek Guide test strip Generic drug: glucose blood Use as directed  twice a day Dx E11.65   Accu-Chek Softclix Lancets lancets Use as instructed twice a day DX E11.65   albuterol 108 (90 Base) MCG/ACT inhaler Commonly known as: VENTOLIN HFA INHALE 2 INHALATIONS INTO THE LUNGS EVERY 4 HOURS AS NEEDED FOR WHEEZE OR FOR SHORTNESS OF BREATH   allopurinol 300 MG tablet Commonly known as: ZYLOPRIM Take 1 tablet (300 mg total) by mouth daily.   apixaban 5 MG Tabs tablet Commonly known as: ELIQUIS Take 1 tablet (5 mg total) by mouth 2 (two) times daily. What changed:  how much to take when to take this   atorvastatin 40 MG tablet Commonly known as: Lipitor Take 1 tablet (40 mg total) by mouth daily.   carvedilol 25 MG tablet Commonly known as: COREG Take 25 mg by mouth 2 (two) times daily.   colchicine 0.6 MG tablet FOR ACUTE GOUT ATTACK, TAKE 2 TABLETS NOW THEN TAKE 1 MORE TABLET 1  HOUR LATER ON THE FIRST DAY. THEN TAKE 1 TABLET DAILY UNTIL FLARE UP RESOLVES.   diltiazem 240 MG 24 hr capsule Commonly known as: CARDIZEM CD Take 240 mg by mouth daily.   esomeprazole 40 MG capsule Commonly known as: NEXIUM Take 1 capsule (40 mg total) by mouth daily at 12 noon.   furosemide 20 MG tablet Commonly known as: LASIX Take 20 mg by mouth daily. What changed: Another medication with the same name was removed. Continue taking this medication, and follow the directions you see here.   Klor-Con M10 10 MEQ tablet Generic drug:  potassium chloride Take 10 mEq by mouth daily. What changed: Another medication with the same name was removed. Continue taking this medication, and follow the directions you see here.   metFORMIN 750 MG 24 hr tablet Commonly known as: GLUCOPHAGE-XR Take 1 tablet (750 mg total) by mouth daily with breakfast.   tamsulosin 0.4 MG Caps capsule Commonly known as: FLOMAX Take 1 capsule (0.4 mg total) by mouth daily.        Follow-up Information     [name]. Schedule an appointment as soon as possible for a visit in 1 week(s).   Specialty: Nurse Practitioner Why: [facility] Discharge F/UP Contact information: [place] [address] [phone]         Anabel Bene, MD. Schedule an appointment as soon as possible for a visit in 2 week(s).   Specialty: Neurology Why: [facility] Discharge F/UP Contact information: [place] [facility] [place] [address] [phone]         Lujean Amel D, MD. Schedule an appointment as soon as possible for a visit in 2 week(s).   Specialties: Cardiology, Internal Medicine Why: [facility] Discharge F/UP Contact information: [place] [address] [phone]                Discharge Exam: [name] Weights   01/01/22 1554  Weight: 111.2 kg   Constitutional:      General: He is not in acute distress. HENT:     Head: Normocephalic and atraumatic.  Cardiovascular:     Rate and Rhythm: Normal rate and regular rhythm.     Heart sounds: Normal heart sounds.  Pulmonary:     CTA B/L Abdominal:     soft, benign Neurological:     alert and awake, non focal.  Condition at discharge: good  The results of significant diagnostics from this hospitalization (including imaging, microbiology, ancillary and laboratory) are listed below for reference.   Imaging Studies: ECHOCARDIOGRAM COMPLETE  Result Date: 01/02/2022     ECHOCARDIOGRAM REPORT   Patient Name:   [name] Date of Exam: 01/02/2022 Medical Rec #:  [id number]             Height:       75.0 in Accession #:    [id number]            Weight:       245.1 lb Date of Birth:  16-Jun-1945              BSA:          2.393 m Patient Age:    76 years              BP:           171/100 mmHg Patient Gender: M  HR:           64 bpm. Exam Location:  [facility] Procedure: 2D Echo, Cardiac Doppler and Color Doppler Indications:     Stroke I63.9  History:         Patient has prior history of Echocardiogram examinations, most                  recent 05/03/2018. Risk Factors:Diabetes, Hypertension and                  Dyslipidemia.  Sonographer:     Sherrie Sport Referring Phys:  [id number] Athena Masse Diagnosing Phys: [name] MD  Sonographer Comments: Suboptimal apical window. IMPRESSIONS  1. Left ventricular ejection fraction, by estimation, is 55 to 60%. The left ventricle has normal function. The left ventricle has no regional wall motion abnormalities. The left ventricular internal cavity size was mildly dilated. Left ventricular diastolic parameters were normal.  2. Right ventricular systolic function is normal. The right ventricular size is normal.  3. Left atrial size was moderately dilated.  4. Right atrial size was moderately dilated.  5. The mitral valve is normal in structure. No evidence of mitral valve regurgitation.  6. The aortic valve is normal in structure. Aortic valve regurgitation is not visualized. FINDINGS  Left Ventricle: Left ventricular ejection fraction, by estimation, is 55 to 60%. The left ventricle has normal function. The left ventricle has no regional wall motion abnormalities. The left ventricular internal cavity size was mildly dilated. There is  no left ventricular hypertrophy. Left ventricular diastolic parameters were normal. Right Ventricle: The right ventricular size is normal. No increase in right ventricular wall thickness.  Right ventricular systolic function is normal. Left Atrium: Left atrial size was moderately dilated. Right Atrium: Right atrial size was moderately dilated. Pericardium: There is no evidence of pericardial effusion. Mitral Valve: The mitral valve is normal in structure. No evidence of mitral valve regurgitation. Tricuspid Valve: The tricuspid valve is normal in structure. Tricuspid valve regurgitation is trivial. Aortic Valve: The aortic valve is normal in structure. Aortic valve regurgitation is not visualized. Aortic valve mean gradient measures 2.0 mmHg. Aortic valve peak gradient measures 3.5 mmHg. Aortic valve area, by VTI measures 2.14 cm. Pulmonic Valve: The pulmonic valve was normal in structure. Pulmonic valve regurgitation is not visualized. Aorta: The ascending aorta was not well visualized. IAS/Shunts: No atrial level shunt detected by color flow Doppler.  LEFT VENTRICLE PLAX 2D LVIDd:         5.46 cm LVIDs:         3.73 cm LV PW:         1.24 cm LV IVS:        1.35 cm LVOT diam:     2.30 cm LV SV:         38 LV SV Index:   16 LVOT Area:     4.15 cm  RIGHT VENTRICLE RV Basal diam:  4.77 cm RV S prime:     9.46 cm/s TAPSE (M-mode): 1.1 cm LEFT ATRIUM            Index        RIGHT ATRIUM           Index LA diam:      5.50 cm  2.30 cm/m   RA Area:     30.90 cm LA Vol (A2C): 119.0 ml 49.74 ml/m  RA Volume:   113.00 ml 47.23 ml/m LA Vol (A4C): 159.0 ml 66.45  ml/m  AORTIC VALVE AV Area (Vmax):    2.09 cm AV Area (Vmean):   2.07 cm AV Area (VTI):     2.14 cm AV Vmax:           93.30 cm/s AV Vmean:          62.300 cm/s AV VTI:            0.178 m AV Peak Grad:      3.5 mmHg AV Mean Grad:      2.0 mmHg LVOT Vmax:         47.00 cm/s LVOT Vmean:        31.000 cm/s LVOT VTI:          0.092 m LVOT/AV VTI ratio: 0.51  AORTA Ao Root diam: 3.40 cm MITRAL VALVE               TRICUSPID VALVE MV Area (PHT): 4.41 cm    TR Peak grad:   11.8 mmHg MV Decel Time: 172 msec    TR Vmax:        172.00 cm/s MV E velocity:  88.70 cm/s                            SHUNTS                            Systemic VTI:  0.09 m                            Systemic Diam: 2.30 cm [name] MD Electronically signed by [name] MD Signature Date/Time: 01/02/2022/10:00:23 PM    Final    US Carotid Bilateral (at [facility] and [facility] only)  Result Date: 01/02/2022 CLINICAL DATA:  Stroke-like symptoms EXAM: BILATERAL CAROTID DUPLEX ULTRASOUND TECHNIQUE: Pearline Cables scale imaging, color Doppler and duplex ultrasound were performed of bilateral carotid and vertebral arteries in the neck. COMPARISON:  None Available. FINDINGS: Criteria: Quantification of carotid stenosis is based on velocity parameters that correlate the residual internal carotid diameter with NASCET-based stenosis levels, using the diameter of the distal internal carotid lumen as the denominator for stenosis measurement. The following velocity measurements were obtained: RIGHT ICA: 24/8 cm/sec CCA: 77/41 cm/sec SYSTOLIC ICA/CCA RATIO:  2.87 ECA: 57 cm/sec LEFT ICA: 44/10 cm/sec CCA: 86/76 cm/sec SYSTOLIC ICA/CCA RATIO:  7.20 ECA: 70 cm/sec RIGHT CAROTID ARTERY: Preliminary grayscale images demonstrate mild intimal thickening within the common carotid artery. The internal carotid artery is well visualized and within normal limits. Waveforms, velocities and flow velocity ratios show no evidence of focal hemodynamically significant stenosis. RIGHT VERTEBRAL ARTERY:  Antegrade in nature. LEFT CAROTID ARTERY: Preliminary grayscale images demonstrate mild intimal thickening in the common carotid artery. Minimal plaque in the region of the carotid bulb is seen. The waveforms, velocities and flow velocity ratios show no evidence of focal hemodynamically significant stenosis. LEFT VERTEBRAL ARTERY:  Antegrade in nature. Note is made of a heterogeneous left lobe of the thyroid. A prominent nodule measuring up to 3.9 cm is noted. IMPRESSION: No evidence of carotid stenosis. Recommend nonemergent  thyroid US (ref: J Am Coll Radiol. 2015 Feb;12(2): 143-50). Electronically Signed   By: Inez Catalina M.D.   On: 01/02/2022 00:15   CT Head Wo Contrast  Result Date: 01/01/2022 CLINICAL DATA:  Neuro deficit, acute stroke suspected. Slurred speech for 2 days. EXAM:  CT HEAD WITHOUT CONTRAST TECHNIQUE: Contiguous axial images were obtained from the base of the skull through the vertex without intravenous contrast. RADIATION DOSE REDUCTION: This exam was performed according to the departmental dose-optimization program which includes automated exposure control, adjustment of the mA and/or kV according to patient size and/or use of iterative reconstruction technique. COMPARISON:  None Available. FINDINGS: Brain: Hypodensity in the left frontal lobe suggesting recent infarction. No evidence of acute intracranial hemorrhage or mass. No midline shift. Low attenuation of periventricular and subcortical white matter presumed chronic microvascular ischemic changes. Vascular: No hyperdense vessel or unexpected calcification. Skull: Normal. Negative for fracture or focal lesion. Sinuses/Orbits: No acute finding. Other: None. IMPRESSION: 1. Low-density in the left frontal lobe suggesting recent infarction, further evaluation with MR examination would be helpful. No evidence of acute intracranial hemorrhage. 2. Chronic microvascular ischemic changes of the white matter. Electronically Signed   By: Keane Police D.O.   On: 01/01/2022 16:22   CARDIAC CATHETERIZATION  Result Date: 12/30/2021   The left ventricular systolic function is normal.   LV end diastolic pressure is mildly elevated.   The left ventricular ejection fraction is 55-65% by visual estimate.   Hemodynamic findings consistent with moderate pulmonary hypertension.   Normal coronaries no significant obstructive disease Conclusion Mild to moderate pulmonary hypertension Preserved left ventricular function EF around 55% Coronaries No significant obstructive  coronary disease Recommend medical therapy Have the patient follow-up with pulmonary    Microbiology: Results for orders placed or performed in visit on 08/16/21  Microscopic Examination     Status: None   Collection Time: 08/16/21 10:19 AM   Urine  Result Value Ref Range Status   WBC, UA None seen 0 - 5 /hpf Final   RBC, Urine None seen 0 - 2 /hpf Final   Epithelial Cells (non renal) None seen 0 - 10 /hpf Final   Casts None seen None seen /lpf Final   Bacteria, UA None seen None seen/Few Final    Labs: CBC: No results for input(s): "WBC", "NEUTROABS", "HGB", "HCT", "MCV", "PLT" in the last 168 hours. Basic Metabolic Panel: No results for input(s): "NA", "K", "CL", "CO2", "GLUCOSE", "BUN", "CREATININE", "CALCIUM", "MG", "PHOS" in the last 168 hours. Liver Function Tests: No results for input(s): "AST", "ALT", "ALKPHOS", "BILITOT", "PROT", "ALBUMIN" in the last 168 hours. CBG: No results for input(s): "GLUCAP" in the last 168 hours.  Discharge time spent: greater than 30 minutes.  Signed: Max Sane, MD [facility] 01/15/2022

## 2022-02-11 DIAGNOSIS — G4733 Obstructive sleep apnea (adult) (pediatric): Secondary | ICD-10-CM | POA: Diagnosis not present

## 2022-02-11 DIAGNOSIS — R0602 Shortness of breath: Secondary | ICD-10-CM | POA: Diagnosis not present

## 2022-02-13 ENCOUNTER — Ambulatory Visit: Payer: Medicare HMO | Admitting: Nurse Practitioner

## 2022-02-25 ENCOUNTER — Other Ambulatory Visit: Payer: Self-pay

## 2022-02-25 ENCOUNTER — Emergency Department
Admission: EM | Admit: 2022-02-25 | Discharge: 2022-02-25 | Disposition: A | Discharge status: Home / Self Care | Payer: Medicare HMO | Attending: Emergency Medicine | Admitting: Emergency Medicine

## 2022-02-25 ENCOUNTER — Emergency Department: Payer: Medicare HMO

## 2022-02-25 DIAGNOSIS — S0992XA Unspecified injury of nose, initial encounter: Secondary | ICD-10-CM | POA: Diagnosis present

## 2022-02-25 DIAGNOSIS — W01198A Fall on same level from slipping, tripping and stumbling with subsequent striking against other object, initial encounter: Secondary | ICD-10-CM | POA: Diagnosis not present

## 2022-02-25 DIAGNOSIS — S0242XA Fracture of alveolus of maxilla, initial encounter for closed fracture: Secondary | ICD-10-CM | POA: Insufficient documentation

## 2022-02-25 DIAGNOSIS — R040 Epistaxis: Secondary | ICD-10-CM | POA: Insufficient documentation

## 2022-02-25 DIAGNOSIS — Z7901 Long term (current) use of anticoagulants: Secondary | ICD-10-CM | POA: Insufficient documentation

## 2022-02-25 DIAGNOSIS — H1133 Conjunctival hemorrhage, bilateral: Secondary | ICD-10-CM | POA: Diagnosis not present

## 2022-02-25 DIAGNOSIS — I10 Essential (primary) hypertension: Secondary | ICD-10-CM | POA: Diagnosis not present

## 2022-02-25 DIAGNOSIS — S0081XA Abrasion of other part of head, initial encounter: Secondary | ICD-10-CM | POA: Insufficient documentation

## 2022-02-25 DIAGNOSIS — Z23 Encounter for immunization: Secondary | ICD-10-CM | POA: Diagnosis not present

## 2022-02-25 DIAGNOSIS — S0990XA Unspecified injury of head, initial encounter: Secondary | ICD-10-CM

## 2022-02-25 DIAGNOSIS — S022XXA Fracture of nasal bones, initial encounter for closed fracture: Secondary | ICD-10-CM | POA: Insufficient documentation

## 2022-02-25 DIAGNOSIS — Z950 Presence of cardiac pacemaker: Secondary | ICD-10-CM | POA: Diagnosis not present

## 2022-02-25 DIAGNOSIS — M47812 Spondylosis without myelopathy or radiculopathy, cervical region: Secondary | ICD-10-CM | POA: Diagnosis not present

## 2022-02-25 LAB — BASIC METABOLIC PANEL
Anion gap: 9 (ref 5–15)
BUN: 23 mg/dL (ref 8–23)
CO2: 27 mmol/L (ref 22–32)
Calcium: 9.7 mg/dL (ref 8.9–10.3)
Chloride: 105 mmol/L (ref 98–111)
Creatinine, Ser: 1.17 mg/dL (ref 0.61–1.24)
GFR, Estimated: >60 mL/min (ref >60)
Glucose, Bld: 188 mg/dL — ABNORMAL HIGH (ref 70–99)
Potassium: 3.9 mmol/L (ref 3.5–5.1)
Sodium: 141 mmol/L (ref 135–145)

## 2022-02-25 LAB — CBC WITH DIFFERENTIAL/PLATELET
Abs Immature Granulocytes: 0.01 10*3/uL (ref 0.00–0.07)
Basophils Absolute: 0 10*3/uL (ref 0.0–0.1)
Basophils Relative: 1 %
Eosinophils Absolute: 0.2 10*3/uL (ref 0.0–0.5)
Eosinophils Relative: 4 %
HCT: 42.2 % (ref 39.0–52.0)
Hemoglobin: 13.8 g/dL (ref 13.0–17.0)
Immature Granulocytes: 0 %
Lymphocytes Relative: 26 %
Lymphs Abs: 1.3 10*3/uL (ref 0.7–4.0)
MCH: 26.8 pg (ref 26.0–34.0)
MCHC: 32.7 g/dL (ref 30.0–36.0)
MCV: 82.1 fL (ref 80.0–100.0)
Monocytes Absolute: 0.3 10*3/uL (ref 0.1–1.0)
Monocytes Relative: 7 %
Neutro Abs: 3 10*3/uL (ref 1.7–7.7)
Neutrophils Relative %: 62 %
Platelets: 205 10*3/uL (ref 150–400)
RBC: 5.14 MIL/uL (ref 4.22–5.81)
RDW: 14.9 % (ref 11.5–15.5)
WBC: 4.9 10*3/uL (ref 4.0–10.5)
nRBC: 0 % (ref 0.0–0.2)

## 2022-02-25 MED ORDER — OXYCODONE HCL 5 MG PO TABS
5.0000 mg | ORAL_TABLET | Freq: Once | ORAL | Status: AC
  Administered 2022-02-25: 5 mg via ORAL
  Filled 2022-02-25: qty 1

## 2022-02-25 MED ORDER — LIDOCAINE VISCOUS HCL 2 % MT SOLN
15.0000 mL | Freq: Once | OROMUCOSAL | Status: AC
  Administered 2022-02-25: 15 mL via OROMUCOSAL
  Filled 2022-02-25: qty 15

## 2022-02-25 MED ORDER — TETANUS-DIPHTH-ACELL PERTUSSIS 5-2.5-18.5 LF-MCG/0.5 IM SUSY
0.5000 mL | PREFILLED_SYRINGE | Freq: Once | INTRAMUSCULAR | Status: AC
  Administered 2022-02-25: 0.5 mL via INTRAMUSCULAR
  Filled 2022-02-25: qty 0.5

## 2022-02-25 MED ORDER — OXYMETAZOLINE HCL 0.05 % NA SOLN
1.0000 | Freq: Once | NASAL | Status: AC
  Administered 2022-02-25: 1 via NASAL
  Filled 2022-02-25: qty 30

## 2022-02-25 MED ORDER — TETANUS-DIPHTHERIA TOXOIDS TD 5-2 LFU IM INJ: 0.5000 mL | INJECTION | Freq: Once | INTRAMUSCULAR | Status: DC

## 2022-02-25 MED ORDER — AMOXICILLIN-POT CLAVULANATE 875-125 MG PO TABS: 1.0000 | ORAL_TABLET | Freq: Two times a day (BID) | ORAL | 0 refills | Status: AC

## 2022-02-25 MED ORDER — OXYCODONE HCL 5 MG PO TABS: 5.0000 mg | ORAL_TABLET | Freq: Four times a day (QID) | ORAL | 0 refills | Status: AC | PRN

## 2022-02-25 MED ORDER — TRANEXAMIC ACID FOR EPISTAXIS
500.0000 mg | Freq: Once | TOPICAL | Status: AC
  Administered 2022-02-25: 500 mg via TOPICAL
  Filled 2022-02-25: qty 10

## 2022-02-25 NOTE — ED Triage Notes (Signed)
Pt here with a fall this morning. Pt states he missed a step on the steps and fell and landed on his face. Pt is on a blood thinner, Eliquis. Pt also has bleeding being his right eye.

## 2022-02-25 NOTE — ED Provider Notes (Signed)
[facility] Provider Note    Event Date/Time   First MD Initiated Contact with Patient 02/25/22 [phone]     (approximate)   History   Fall and Epistaxis   HPI  Gary PRINTUP Sr. is a 76 y.o. male with paroxysmal A-fib with dual-chamber pacemaker, on Eliquis who comes in with concerns for fall and epistaxis.  Patient reports he missed a step and fell and landed on his face.  Patient denies any loss of consciousness.  He reports that it was a mechanical fall in nature but he has significant bleeding out of his left nostril as well as some oozing out of his lacrimal ducts.  Patient reports that he did hit his head.  He is still able to lift up both of his legs and denies any other injuries.   Physical Exam   Triage Vital Signs: ED Triage Vitals  Enc Vitals Group     BP 02/25/22 0841 (!) 179/89     Pulse Rate 02/25/22 0841 75     Resp 02/25/22 0841 17     Temp 02/25/22 0841 98 F (36.7 C)     Temp Source 02/25/22 0841 Oral     SpO2 02/25/22 0841 98 %     Weight 02/25/22 0843 245 lb (111.1 kg)     Height 02/25/22 0843 6' 2.5" (1.892 m)     Head Circumference --      Peak Flow --      Pain Score 02/25/22 0842 5     Pain Loc --      Pain Edu? --      Excl. in [place]? --     Most recent vital signs: Vitals:   02/25/22 0841  BP: (!) 179/89  Pulse: 75  Resp: 17  Temp: 98 F (36.7 C)  SpO2: 98%     General: Awake, no distress.  CV:  Good peripheral perfusion.  Resp:  Normal effort.  Abd:  No distention.  Other:  Patient has abrasion to the top of his forehead on the right side.  He is got blood oozing from his bilateral eye.  He is got blood and clots noted from his bilateral nose worse on the left than the right.   ED Results / Procedures / Treatments   Labs (all labs ordered are listed, but only abnormal results are displayed) Labs Reviewed  CBC WITH DIFFERENTIAL/PLATELET  BASIC METABOLIC PANEL      RADIOLOGY I have reviewed the CT head  personally interpreted and no ICH   PROCEDURES:  Critical Care performed: No  .Epistaxis Management  Date/Time: 02/25/2022 9:15 AM  Performed by: Vanessa [place], MD Authorized by: Vanessa [place], MD   Consent:    Consent obtained:  Verbal   Consent given by:  Patient   Risks discussed:  Bleeding, infection, nasal injury and pain   Alternatives discussed:  No treatment Universal protocol:    Patient identity confirmed:  Verbally with patient Anesthesia:    Anesthesia method:  None Procedure details:    Treatment site:  L anterior and R anterior   Treatment method:  Merocel sponge   Treatment complexity:  Limited Post-procedure details:    Assessment:  Bleeding decreased   Procedure completion:  Tolerated well, no immediate complications Comments:     Initially TXA, Afrin soaked gauze were placed but patient was still having significant amount of wheezing and the nasal clip would not fit on his nose therefore Merocel was placed.  MEDICATIONS ORDERED IN ED: Medications  Tdap (BOOSTRIX) injection 0.5 mL (has no administration in time range)  oxymetazoline (AFRIN) 0.05 % nasal spray 1 spray (1 spray Each Nare Given 02/25/22 0900)  tranexamic acid (CYKLOKAPRON) 1000 MG/10ML topical solution 500 mg (500 mg Topical Given 02/25/22 0900)     IMPRESSION / MDM / ASSESSMENT AND PLAN / ED COURSE  I reviewed the triage vital signs and the nursing notes.   Patient's presentation is most consistent with acute presentation with potential threat to life or bodily function.   Differential includes intracranial hemorrhage, cervical fracture, facial fracture, nosebleed.  We will update patient's tetanus get some basic labs to evaluate for anemia  BMP stable.  CBC shows stable hemoglobin  I reviewed patient's admission from 01/01/2022 where he was seen for a stroke  CT cervical shows chronic cervical fracture but he had no C-spine tenderness.  CT face shows bilateral nasal fractures  as well as mildly displaced maxillary alveolar process.  He does have called him in his right nostril but on my examination he does not have any septal hematoma and before I placed the packing on the left sided not feel any septal hematoma.  His CT head was negative for acute pathology does show an old stroke.  Incidental finding of thyroid discussed with family and provided copy of the report  We will discuss with Dr. Tami Ribas from ENT for nose -we discussed holding the blood thinner but given his high risk for stroke given he just had 1 2 months ago have opted to hold off on holding the blood thinner.  He does have a little bit of intermittent oozing from the right nostril and we discussed packing this 1 as well just given he cannot hold the blood thinner to ensure that it does not start bleeding again.  Therefore Mcqueen recommended bilateral merocel- Discussed with patient he is willing to have me put another Merisel in.  He will follow-up in office on Monday with ENT.  Patient has no pain in his jaw to suggest mandible fracture.  Tdap updated   We discussed how oxycodone was risk and not driving or working on it.     FINAL CLINICAL IMPRESSION(S) / ED DIAGNOSES   Final diagnoses:  Closed fracture of nasal bone, initial encounter  Epistaxis  Injury of head, initial encounter     Rx / DC Orders   ED Discharge Orders     None        Note:  This document was prepared using Dragon voice recognition software and may include unintentional dictation errors.   Vanessa Brutus, MD 02/25/22 [phone]

## 2022-02-25 NOTE — Discharge Instructions (Addendum)
Follow-up with ENT for nasal fracture.  Please call them to make an appointment for Monday to have your packing removed and reevaluated.  Take the antibiotics prevent any infection and return to the ER if develop worsening symptoms or any other concerns  Enlarged and heterogeneous thyroid gland. A nonemergent thyroid ultrasound is recommended further evaluation.  2. Acute, comminuted and displaced bilateral nasal bone fractures. 3. Associated acute, displaced fractures of the nasal septum. 4. Tiny mildly displaced fracture of the maxillary alveolar process at midline

## 2022-02-26 ENCOUNTER — Telehealth: Payer: Self-pay | Admitting: Nurse Practitioner

## 2022-02-26 NOTE — Telephone Encounter (Signed)
Patient seen in ED due to fall & nasal fx. He stated he has f/u with nose doctor. No need for f/u with pcp per Alyssa-Toni

## 2022-03-03 DIAGNOSIS — S022XXA Fracture of nasal bones, initial encounter for closed fracture: Secondary | ICD-10-CM | POA: Diagnosis not present

## 2022-03-03 DIAGNOSIS — R040 Epistaxis: Secondary | ICD-10-CM | POA: Diagnosis not present

## 2022-03-03 DIAGNOSIS — J342 Deviated nasal septum: Secondary | ICD-10-CM | POA: Diagnosis not present

## 2022-03-05 ENCOUNTER — Telehealth: Payer: Self-pay

## 2022-03-05 NOTE — Telephone Encounter (Signed)
     Patient  visit on 10/24  at [facility]  Have you been able to follow up with your primary care physician? YES  The patient was or was not able to obtain any needed medicine or equipment. YES  Are there diet recommendations that you are having difficulty following? NA  Patient expresses understanding of discharge instructions and education provided has no other needs at this time. [place], [facility], Care Management  [phone] [address], [place], [place] [address] Phone: [phone] Email: [email or web address]

## 2022-03-06 DIAGNOSIS — R001 Bradycardia, unspecified: Secondary | ICD-10-CM | POA: Diagnosis not present

## 2022-03-06 DIAGNOSIS — R5383 Other fatigue: Secondary | ICD-10-CM | POA: Diagnosis not present

## 2022-03-06 DIAGNOSIS — I10 Essential (primary) hypertension: Secondary | ICD-10-CM | POA: Diagnosis not present

## 2022-03-06 DIAGNOSIS — I272 Pulmonary hypertension, unspecified: Secondary | ICD-10-CM | POA: Diagnosis not present

## 2022-03-06 DIAGNOSIS — E1165 Type 2 diabetes mellitus with hyperglycemia: Secondary | ICD-10-CM | POA: Diagnosis not present

## 2022-03-06 DIAGNOSIS — I480 Paroxysmal atrial fibrillation: Secondary | ICD-10-CM | POA: Diagnosis not present

## 2022-03-06 DIAGNOSIS — E669 Obesity, unspecified: Secondary | ICD-10-CM | POA: Diagnosis not present

## 2022-03-06 DIAGNOSIS — E782 Mixed hyperlipidemia: Secondary | ICD-10-CM | POA: Diagnosis not present

## 2022-03-06 DIAGNOSIS — I2510 Atherosclerotic heart disease of native coronary artery without angina pectoris: Secondary | ICD-10-CM | POA: Diagnosis not present

## 2022-03-17 DIAGNOSIS — S022XXA Fracture of nasal bones, initial encounter for closed fracture: Secondary | ICD-10-CM | POA: Diagnosis not present

## 2022-03-17 DIAGNOSIS — J301 Allergic rhinitis due to pollen: Secondary | ICD-10-CM | POA: Diagnosis not present

## 2022-03-24 ENCOUNTER — Telehealth (INDEPENDENT_AMBULATORY_CARE_PROVIDER_SITE_OTHER): Payer: Medicare HMO | Admitting: Physician Assistant

## 2022-03-24 VITALS — Ht 75.5 in | Wt 245.0 lb

## 2022-03-24 DIAGNOSIS — J0100 Acute maxillary sinusitis, unspecified: Secondary | ICD-10-CM

## 2022-03-24 MED ORDER — AMOXICILLIN-POT CLAVULANATE 875-125 MG PO TABS: 1.0000 | ORAL_TABLET | Freq: Two times a day (BID) | ORAL | 0 refills | Status: DC

## 2022-03-24 NOTE — Progress Notes (Signed)
[facility] [place], [place] [address]  Internal MEDICINE  Telephone Visit  Patient Name: [name]  [id number]  [id number]  Date of Service: 03/24/2022  I connected with the patient at 1:23 by telephone and verified the patients identity using two identifiers.   I discussed the limitations, risks, security and privacy concerns of performing an evaluation and management service by telephone and the availability of in person appointments. I also discussed with the patient that there may be a patient responsible charge related to the service.  The patient expressed understanding and agrees to proceed.    Chief Complaint  Patient presents with   Telephone Assessment   Telephone Screen   Sinusitis    3 weeks since he broke his nose due to fall    Headache    HPI Pt is here for virtual sick visit -Sinus congestion, some cough. Did take tylenol day and night. Started about a month ago, stayed the same really -Denies SOB or wheezing. Denies fever, chills, body aches. -Did break nose about a month ago. Did have ENT follow up last week and was told everything looked good and no indication for surgery. Was given nasal spray, but doesn't recall the name. Congestion had started prior to breaking nose. He did not mention the congestion when he saw ENT last week. -Will start mucinex and ABX and advised to follow up with ENT if new or worsening symptoms  Current Medication: Outpatient Encounter Medications as of 03/24/2022  Medication Sig   albuterol (VENTOLIN HFA) 108 (90 Base) MCG/ACT inhaler INHALE 2 INHALATIONS INTO THE LUNGS EVERY 4 HOURS AS NEEDED FOR WHEEZE OR FOR SHORTNESS OF BREATH   allopurinol (ZYLOPRIM) 300 MG tablet Take 1 tablet (300 mg total) by mouth daily.   amoxicillin-clavulanate (AUGMENTIN) 875-125 MG tablet Take 1 tablet by mouth 2 (two) times daily. Take with food.   aspirin EC 81 MG tablet Take by mouth.   carvedilol (COREG) 25 MG tablet  Take 25 mg by mouth 2 (two) times daily.   colchicine 0.6 MG tablet FOR ACUTE GOUT ATTACK, TAKE 2 TABLETS NOW THEN TAKE 1 MORE TABLET 1 HOUR LATER ON THE FIRST DAY. THEN TAKE 1 TABLET DAILY UNTIL FLARE UP RESOLVES.   diltiazem (CARDIZEM CD) 240 MG 24 hr capsule Take 240 mg by mouth daily.   esomeprazole (NEXIUM) 40 MG capsule Take 1 capsule (40 mg total) by mouth daily at 12 noon.   furosemide (LASIX) 20 MG tablet Take 20 mg by mouth daily.   KLOR-CON M10 10 MEQ tablet Take 10 mEq by mouth daily.   losartan-hydrochlorothiazide (HYZAAR) 100-12.5 MG tablet Take 1 tablet by mouth daily.   metFORMIN (GLUCOPHAGE-XR) 750 MG 24 hr tablet Take 1 tablet (750 mg total) by mouth daily with breakfast.   simvastatin (ZOCOR) 40 MG tablet Take 40 mg by mouth daily.   tamsulosin (FLOMAX) 0.4 MG CAPS capsule Take 1 capsule (0.4 mg total) by mouth daily.   apixaban (ELIQUIS) 5 MG TABS tablet Take 1 tablet (5 mg total) by mouth 2 (two) times daily.   atorvastatin (LIPITOR) 40 MG tablet Take 1 tablet (40 mg total) by mouth daily.   No facility-administered encounter medications on file as of 03/24/2022.    Surgical History: Past Surgical History:  Procedure Laterality Date   CARDIAC CATHETERIZATION     COLONOSCOPY WITH PROPOFOL N/A 01/24/2020   Procedure: COLONOSCOPY WITH PROPOFOL;  Surgeon: [name], MD;  Location: [facility] ENDOSCOPY;  Service:  Gastroenterology;  Laterality: N/A;   CORONARY/GRAFT ACUTE MI REVASCULARIZATION N/A 05/02/2018   Procedure: Coronary/Graft Acute MI Revascularization;  Surgeon: [name], MD;  Location: [place] [facility];  Service: Cardiovascular;  Laterality: N/A;   HERNIA REPAIR     LEFT HEART CATH AND CORONARY ANGIOGRAPHY N/A 05/02/2018   Procedure: LEFT HEART CATH AND CORONARY ANGIOGRAPHY;  Surgeon: [name], MD;  Location: [place] [facility];  Service: Cardiovascular;  Laterality: N/A;   PACEMAKER INSERTION Left 05/03/2018   Procedure: INSERTION PACEMAKER;   Surgeon: [name], MD;  Location: [facility] ORS;  Service: Cardiovascular;  Laterality: Left;   RIGHT/LEFT HEART CATH AND CORONARY ANGIOGRAPHY Bilateral 12/30/2021   Procedure: RIGHT/LEFT HEART CATH AND CORONARY ANGIOGRAPHY;  Surgeon: [name], MD;  Location: [place] [facility];  Service: Cardiovascular;  Laterality: Bilateral;   TEMPORARY PACEMAKER N/A 05/02/2018   Procedure: TEMPORARY PACEMAKER;  Surgeon: [name], MD;  Location: [place] [facility];  Service: Cardiovascular;  Laterality: N/A;   TEMPORARY PACEMAKER Right 05/03/2018   Procedure: TEMPORARY PACEMAKER REMOVAL;  Surgeon: [name], MD;  Location: [facility] ORS;  Service: Cardiovascular;  Laterality: Right;   TONSILLECTOMY Bilateral    1992    Medical History: Past Medical History:  Diagnosis Date   Diabetes mellitus without complication ([place])    GERD (gastroesophageal reflux disease)    Hyperlipidemia    Hypertension     Family History: Family History  Problem Relation Age of Onset   Cirrhosis Father    Bone cancer Mother     Social History   Socioeconomic History   Marital status: Married    Spouse name: Not on file   Number of children: Not on file   Years of education: Not on file   Highest education level: Not on file  Occupational History   Not on file  Tobacco Use   Smoking status: Former    Types: Cigarettes   Smokeless tobacco: Never  Vaping Use   Vaping Use: Never used  Substance and Sexual Activity   Alcohol use: No   Drug use: No   Sexual activity: Not on file  Other Topics Concern   Not on file  Social History Narrative   Not on file   Social Determinants of Health   Financial Resource Strain: Not on file  Food Insecurity: Not on file  Transportation Needs: Not on file  Physical Activity: Not on file  Stress: Not on file  Social Connections: Not on file  Intimate Partner Violence: Not on file      Review of Systems  Constitutional:   Negative for chills, fatigue and fever.  HENT:  Positive for congestion, postnasal drip and sinus pressure. Negative for mouth sores.   Respiratory:  Positive for cough. Negative for shortness of breath and wheezing.   Cardiovascular:  Negative for chest pain.  Genitourinary:  Negative for flank pain.  Psychiatric/Behavioral: Negative.      Vital Signs: Ht 6' 3.5" (1.918 m)   Wt 245 lb (111.1 kg)   BMI 30.22 kg/m    Observation/Objective:  Pt is able to carry out conversation   Assessment/Plan: 1. Acute non-recurrent maxillary sinusitis Will start on augmentin BID with food and will start mucinex. If any new or worsening symptoms should follow up with ENT given nose break 1 month ago could contribute to feeling of congestion. - amoxicillin-clavulanate (AUGMENTIN) 875-125 MG tablet; Take 1 tablet by mouth 2 (two) times daily. Take with food.  Dispense:  20 tablet; Refill: 0   General Counseling: Triton verbalizes understanding of the findings of today's phone visit and agrees with plan of treatment. I have discussed any further diagnostic evaluation that may be needed or ordered today. We also reviewed his medications today. he has been encouraged to call the office with any questions or concerns that should arise related to todays visit.    No orders of the defined types were placed in this encounter.   Meds ordered this encounter  Medications   amoxicillin-clavulanate (AUGMENTIN) 875-125 MG tablet    Sig: Take 1 tablet by mouth 2 (two) times daily. Take with food.    Dispense:  20 tablet    Refill:  0    Time spent:25 Minutes    Dr Lavera Guise Internal medicine

## 2022-03-27 ENCOUNTER — Other Ambulatory Visit: Payer: Self-pay | Admitting: Nurse Practitioner

## 2022-03-27 DIAGNOSIS — N400 Enlarged prostate without lower urinary tract symptoms: Secondary | ICD-10-CM

## 2022-04-01 ENCOUNTER — Ambulatory Visit: Payer: Medicare HMO | Attending: Student | Admitting: Speech Pathology

## 2022-04-01 DIAGNOSIS — R4701 Aphasia: Secondary | ICD-10-CM | POA: Diagnosis not present

## 2022-04-01 DIAGNOSIS — R482 Apraxia: Secondary | ICD-10-CM | POA: Insufficient documentation

## 2022-04-01 DIAGNOSIS — R471 Dysarthria and anarthria: Secondary | ICD-10-CM | POA: Diagnosis not present

## 2022-04-03 ENCOUNTER — Encounter: Payer: Self-pay | Admitting: Speech Pathology

## 2022-04-03 ENCOUNTER — Ambulatory Visit: Payer: Medicare HMO | Admitting: Speech Pathology

## 2022-04-03 DIAGNOSIS — R482 Apraxia: Secondary | ICD-10-CM | POA: Diagnosis not present

## 2022-04-03 DIAGNOSIS — R471 Dysarthria and anarthria: Secondary | ICD-10-CM

## 2022-04-03 DIAGNOSIS — R4701 Aphasia: Secondary | ICD-10-CM | POA: Diagnosis not present

## 2022-04-03 NOTE — Therapy (Signed)
OUTPATIENT SPEECH LANGUAGE PATHOLOGY APHASIA EVALUATION   Patient Name: Gary NABOR Sr. MRN: [id number] DOB:October 11, 1945, 75 y.o., male 48 Date: 04/03/2022  PCP: [name], NP REFERRING PROVIDER: Gladstone Pih, NP  END OF SESSION:  End of Session - 04/03/22 1123     Visit Number 1    Number of Visits 17    Date for SLP Re-Evaluation 05/27/22    Authorization Type [employer] HMO    Progress Note Due on Visit 10    SLP Start Time 1100    SLP Stop Time  1200    SLP Time Calculation (min) 60 min    Activity Tolerance Patient tolerated treatment well             Past Medical History:  Diagnosis Date   Diabetes mellitus without complication ([place])    GERD (gastroesophageal reflux disease)    Hyperlipidemia    Hypertension    Past Surgical History:  Procedure Laterality Date   CARDIAC CATHETERIZATION     COLONOSCOPY WITH PROPOFOL N/A 01/24/2020   Procedure: COLONOSCOPY WITH PROPOFOL;  Surgeon: [name], MD;  Location: [facility] ENDOSCOPY;  Service: Gastroenterology;  Laterality: N/A;   CORONARY/GRAFT ACUTE MI REVASCULARIZATION N/A 05/02/2018   Procedure: Coronary/Graft Acute MI Revascularization;  Surgeon: [name], MD;  Location: [place] [facility];  Service: Cardiovascular;  Laterality: N/A;   HERNIA REPAIR     LEFT HEART CATH AND CORONARY ANGIOGRAPHY N/A 05/02/2018   Procedure: LEFT HEART CATH AND CORONARY ANGIOGRAPHY;  Surgeon: [name], MD;  Location: [place] [facility];  Service: Cardiovascular;  Laterality: N/A;   PACEMAKER INSERTION Left 05/03/2018   Procedure: INSERTION PACEMAKER;  Surgeon: [name], MD;  Location: [facility] ORS;  Service: Cardiovascular;  Laterality: Left;   RIGHT/LEFT HEART CATH AND CORONARY ANGIOGRAPHY Bilateral 12/30/2021   Procedure: RIGHT/LEFT HEART CATH AND CORONARY ANGIOGRAPHY;  Surgeon: [name], MD;  Location: [place] [facility];  Service: Cardiovascular;  Laterality: Bilateral;    TEMPORARY PACEMAKER N/A 05/02/2018   Procedure: TEMPORARY PACEMAKER;  Surgeon: [name], MD;  Location: [place] [facility];  Service: Cardiovascular;  Laterality: N/A;   TEMPORARY PACEMAKER Right 05/03/2018   Procedure: TEMPORARY PACEMAKER REMOVAL;  Surgeon: [name], MD;  Location: [facility] ORS;  Service: Cardiovascular;  Laterality: Right;   TONSILLECTOMY Bilateral    1992   Patient Active Problem List   Diagnosis Date Noted   Mixed hyperlipidemia    Acute ischemic stroke ([place]) 01/01/2022   Diabetes mellitus without complication (HCC)    PAF (paroxysmal atrial fibrillation) (HCC)    Chronic anticoagulation    History of cardiac pacemaker    History of prostate cancer    Cellulitis 06/11/2020   Screening for colon cancer 11/09/2019   Sebaceous cyst of ear 11/09/2019   Encounter for general adult medical examination with abnormal findings 06/23/2019   Gout 06/23/2019   Other fatigue 05/14/2019   Type 2 diabetes mellitus with hyperglycemia ([place]) 12/13/2018   Atopic dermatitis 12/13/2018   Coronary artery disease involving native heart 05/23/2018   Right upper quadrant abdominal pain 05/23/2018   Bradycardia 04/30/2018   Essential hypertension 05/18/2017   Gastroesophageal reflux disease without esophagitis 05/18/2017   Prostate cancer ([place]) 06/26/2016   Anejaculation 10/18/2012   Erectile dysfunction following radiation therapy 10/18/2012   Microscopic hematuria 04/19/2012   Testicular hypofunction 04/19/2012    ONSET DATE: 01/01/2022 date of dx; 01/15/2022 date of referral   REFERRING DIAG: R47.1 (ICD-10-CM) - Dysarthria  THERAPY DIAG:  Aphasia  Apraxia  Dysarthria and anarthria  Rationale for Evaluation and Treatment: Rehabilitation  SUBJECTIVE:   SUBJECTIVE STATEMENT: Pt pleasant, Pt and this writer have several mutual friends Pt accompanied by: family member  PERTINENT HISTORY: Pt is a 76 year old male with history of coronary disease,  paroxsymal A-fib, dual-chamber pacemaker, hypertension and diabetes.   More recently, pt has cardiac catheterization on 12/30/2021. Pt experienced word finding difficulty after procedure. Pt re-admited to [facility] on 01/01/2022 with dx of stroke.    PAIN:  Are you having pain? No  FALLS: Has patient fallen in last 6 months?  No  LIVING ENVIRONMENT: Lives with: lives alone Lives in: House/apartment  PLOF:  Level of assistance: Independent with ADLs, Independent with IADLs Employment: Full-time employment pt owns his own business  PATIENT GOALS: to have more fluent speech  OBJECTIVE:   DIAGNOSTIC FINDINGS: Head CT 01/01/2022 1. Low-density in the left frontal lobe suggesting recent infarction, further evaluation with MR examination would be helpful. No evidence of acute intracranial hemorrhage. 2. Chronic microvascular ischemic changes of the white matter.   COGNITION: Overall cognitive status: Within functional limits for tasks assessed  AUDITORY COMPREHENSION: Overall auditory comprehension: Appears intact YES/NO questions: Appears intact Following directions: Appears intact Conversation: Complex  READING COMPREHENSION: Intact  EXPRESSION: verbal  VERBAL EXPRESSION: Level of generative/spontaneous verbalization: conversation Automatic speech: name: intact and social response: intact  Repetition: Appears intact Naming: Convergent: 51-75% and Divergent: 51-75% Pragmatics: Appears intact Comments: mild higher level word finding deficits noted Interfering components:  N/A Effective technique: sentence completion Non-verbal means of communication: N/A  WRITTEN EXPRESSION: Dominant hand: right Written expression: Appears intact  MOTOR SPEECH: Overall motor speech: Appears intact Respiration: diaphragmatic/abdominal breathing Phonation: normal Resonance: WFL Articulation: Appears intact Intelligibility: Intelligible Motor planning: Appears intact Motor speech  errors:  N/A  ORAL MOTOR EXAMINATION: Overall status: WFL Comments: N/A  STANDARDIZED ASSESSMENTS:  Phonemic Fluency Test: FAS Pt was asked to generate words for the letters F, A, and S for 1 minute. Pt was instructed that proper nouns and multiple words using the same stem with a different suffix (e.g., friend, friends, friendly) were not acceptable. A comparison was made to pt's age (34-79) and educational level (9-12 years).   Pt generated a total of 13 words placing him below the 10th percentile for age and educational level.   Semantic Fluency Test:  Pt was asked to say the names of as many animals that he could think of in a 1-minute period. A comparison was made to pt's age (17-79) and educational level (9-12 years).  Pt generated 16 animals placing him in the 50th percentile     PATIENT REPORTED OUTCOME MEASURES (PROM): Will assess during next session    PATIENT EDUCATION: Education details: results of this evaluation and ST POC Person educated: Patient and Child(ren) Education method: Explanation and Demonstration Education comprehension: verbalized understanding and returned demonstration   GOALS: Goals reviewed with patient? Yes  SHORT TERM GOALS: Target date: 10 sessions  During conversation, pt will give 3 semantic feature descriptors when having a word finding difficulty with 75% accuracy given verbal and visual cues.   Baseline: Goal status: INITIAL  2.  Pt will name at least 15 items in a named category independently with 100% accuracy. Baseline:  Goal status: INITIAL   LONG TERM GOALS: Target date: 05/27/2022  Pt will use functional communication skills for social interaction (e.g., greetings,       social etiquette, and short  questions/simple sentences) with both familiar and unfamiliar partners with 90% success  Baseline:  Goal status: INITIAL   ASSESSMENT:  CLINICAL IMPRESSION: Patient is a 76 y.o. male who was seen today for a speech  language evaluation. Pt presents with mild higher level expressive aphasia that is c/b mild to moderate word finding deficits resulting in halting speech.    OBJECTIVE IMPAIRMENTS: include expressive language, aphasia, and apraxia. These impairments are limiting patient from return to work, managing appointments, and effectively communicating at home and in community. Factors affecting potential to achieve goals and functional outcome are N/A. Patient will benefit from skilled SLP services to address above impairments and improve overall function.  REHAB POTENTIAL: Good  PLAN:  SLP FREQUENCY: 2x/week  SLP DURATION: 8 weeks  PLANNED INTERVENTIONS: Language facilitation, SLP instruction and feedback, Compensatory strategies, and Patient/family education  Chirstopher Iovino B. Rutherford Nail, M.S., CCC-SLP, Mining engineer Certified Brain Injury [place]  [place] Office [phone] Ascom [phone] Fax [phone]

## 2022-04-04 NOTE — Therapy (Signed)
OUTPATIENT SPEECH LANGUAGE PATHOLOGY APHASIA EVALUATION   Patient Name: Gary ASFAW Sr. MRN: [id number] DOB:1945-12-02, 76 y.o., male 20 Date: 04/04/2022  PCP: [name], NP REFERRING PROVIDER: Gladstone Pih, NP  END OF SESSION:  End of Session - 04/04/22 0757     Visit Number 2    Number of Visits 17    Date for SLP Re-Evaluation 05/27/22    Authorization Type [employer] HMO    Progress Note Due on Visit 10    SLP Start Time 100    SLP Stop Time  1400    SLP Time Calculation (min) 60 min    Activity Tolerance Patient tolerated treatment well             Past Medical History:  Diagnosis Date   Diabetes mellitus without complication ([place])    GERD (gastroesophageal reflux disease)    Hyperlipidemia    Hypertension    Past Surgical History:  Procedure Laterality Date   CARDIAC CATHETERIZATION     COLONOSCOPY WITH PROPOFOL N/A 01/24/2020   Procedure: COLONOSCOPY WITH PROPOFOL;  Surgeon: [name], MD;  Location: [facility] ENDOSCOPY;  Service: Gastroenterology;  Laterality: N/A;   CORONARY/GRAFT ACUTE MI REVASCULARIZATION N/A 05/02/2018   Procedure: Coronary/Graft Acute MI Revascularization;  Surgeon: [name], MD;  Location: [place] [facility];  Service: Cardiovascular;  Laterality: N/A;   HERNIA REPAIR     LEFT HEART CATH AND CORONARY ANGIOGRAPHY N/A 05/02/2018   Procedure: LEFT HEART CATH AND CORONARY ANGIOGRAPHY;  Surgeon: [name], MD;  Location: [place] [facility];  Service: Cardiovascular;  Laterality: N/A;   PACEMAKER INSERTION Left 05/03/2018   Procedure: INSERTION PACEMAKER;  Surgeon: [name], MD;  Location: [facility] ORS;  Service: Cardiovascular;  Laterality: Left;   RIGHT/LEFT HEART CATH AND CORONARY ANGIOGRAPHY Bilateral 12/30/2021   Procedure: RIGHT/LEFT HEART CATH AND CORONARY ANGIOGRAPHY;  Surgeon: [name], MD;  Location: [place] [facility];  Service: Cardiovascular;  Laterality: Bilateral;    TEMPORARY PACEMAKER N/A 05/02/2018   Procedure: TEMPORARY PACEMAKER;  Surgeon: [name], MD;  Location: [place] [facility];  Service: Cardiovascular;  Laterality: N/A;   TEMPORARY PACEMAKER Right 05/03/2018   Procedure: TEMPORARY PACEMAKER REMOVAL;  Surgeon: [name], MD;  Location: [facility] ORS;  Service: Cardiovascular;  Laterality: Right;   TONSILLECTOMY Bilateral    1992   Patient Active Problem List   Diagnosis Date Noted   Mixed hyperlipidemia    Acute ischemic stroke ([place]) 01/01/2022   Diabetes mellitus without complication (HCC)    PAF (paroxysmal atrial fibrillation) (HCC)    Chronic anticoagulation    History of cardiac pacemaker    History of prostate cancer    Cellulitis 06/11/2020   Screening for colon cancer 11/09/2019   Sebaceous cyst of ear 11/09/2019   Encounter for general adult medical examination with abnormal findings 06/23/2019   Gout 06/23/2019   Other fatigue 05/14/2019   Type 2 diabetes mellitus with hyperglycemia ([place]) 12/13/2018   Atopic dermatitis 12/13/2018   Coronary artery disease involving native heart 05/23/2018   Right upper quadrant abdominal pain 05/23/2018   Bradycardia 04/30/2018   Essential hypertension 05/18/2017   Gastroesophageal reflux disease without esophagitis 05/18/2017   Prostate cancer ([place]) 06/26/2016   Anejaculation 10/18/2012   Erectile dysfunction following radiation therapy 10/18/2012   Microscopic hematuria 04/19/2012   Testicular hypofunction 04/19/2012    ONSET DATE: 01/01/2022 date of dx; 01/15/2022 date of referral   REFERRING DIAG: R47.1 (ICD-10-CM) - Dysarthria  THERAPY DIAG:  Dysarthria and anarthria  Aphasia  Rationale for Evaluation and Treatment: Rehabilitation  SUBJECTIVE:   SUBJECTIVE STATEMENT: Pt pleasant, Pt and this writer have several mutual friends Pt accompanied by: family member  PERTINENT HISTORY: Pt is a 76 year old male with history of coronary disease, paroxsymal  A-fib, dual-chamber pacemaker, hypertension and diabetes.   More recently, pt has cardiac catheterization on 12/30/2021. Pt experienced word finding difficulty after procedure. Pt re-admited to [facility] on 01/01/2022 with dx of stroke.    PAIN:  Are you having pain? No  FALLS: Has patient fallen in last 6 months?  No  LIVING ENVIRONMENT: Lives with: lives alone Lives in: House/apartment  PLOF:  Level of assistance: Independent with ADLs, Independent with IADLs Employment: Full-time employment pt owns his own business  PATIENT GOALS: to have more fluent speech  OBJECTIVE:   DIAGNOSTIC FINDINGS: Head CT 01/01/2022 1. Low-density in the left frontal lobe suggesting recent infarction, further evaluation with MR examination would be helpful. No evidence of acute intracranial hemorrhage. 2. Chronic microvascular ischemic changes of the white matter.   COGNITION: Overall cognitive status: Within functional limits for tasks assessed  AUDITORY COMPREHENSION: Overall auditory comprehension: Appears intact YES/NO questions: Appears intact Following directions: Appears intact Conversation: Complex  READING COMPREHENSION: Intact  EXPRESSION: verbal  VERBAL EXPRESSION: Level of generative/spontaneous verbalization: conversation Automatic speech: name: intact and social response: intact  Repetition: Appears intact Naming: Convergent: 51-75% and Divergent: 51-75% Pragmatics: Appears intact Comments: mild higher level word finding deficits noted Interfering components:  N/A Effective technique: sentence completion Non-verbal means of communication: N/A  WRITTEN EXPRESSION: Dominant hand: right Written expression: Appears intact  MOTOR SPEECH: Overall motor speech: Appears intact Respiration: diaphragmatic/abdominal breathing Phonation: normal Resonance: WFL Articulation: Appears intact Intelligibility: Intelligible Motor planning: Appears intact Motor speech errors:   N/A  ORAL MOTOR EXAMINATION: Overall status: WFL Comments: N/A  STANDARDIZED ASSESSMENTS:  Phonemic Fluency Test: FAS Pt was asked to generate words for the letters F, A, and S for 1 minute. Pt was instructed that proper nouns and multiple words using the same stem with a different suffix (e.g., friend, friends, friendly) were not acceptable. A comparison was made to pt's age (56-79) and educational level (9-12 years).   Pt generated a total of 13 words placing him below the 10th percentile for age and educational level.   Semantic Fluency Test:  Pt was asked to say the names of as many animals that he could think of in a 1-minute period. A comparison was made to pt's age (22-79) and educational level (9-12 years).  Pt generated 16 animals placing him in the 50th percentile     PATIENT REPORTED OUTCOME MEASURES (PROM): Will assess during next session    PATIENT EDUCATION: Education details: results of this evaluation and ST POC Person educated: Patient and Child(ren) Education method: Explanation and Demonstration Education comprehension: verbalized understanding and returned demonstration   GOALS: Goals reviewed with patient? Yes  SHORT TERM GOALS: Target date: 10 sessions  During conversation, pt will give 3 semantic feature descriptors when having a word finding difficulty with 75% accuracy given verbal and visual cues.   Baseline: Goal status: INITIAL  2.  Pt will name at least 15 items in a named category independently with 100% accuracy. Baseline:  Goal status: INITIAL   LONG TERM GOALS: Target date: 05/27/2022  Pt will use functional communication skills for social interaction (e.g., greetings,       social etiquette, and short questions/simple sentences)  with both familiar and unfamiliar partners with 90% success  Baseline:  Goal status: INITIAL   ASSESSMENT:  CLINICAL IMPRESSION: Patient is a 76 y.o. male who was seen today for a speech language  evaluation. Pt presents with mild higher level expressive aphasia that is c/b mild to moderate word finding deficits resulting in halting speech.    OBJECTIVE IMPAIRMENTS: include expressive language, aphasia, and apraxia. These impairments are limiting patient from return to work, managing appointments, and effectively communicating at home and in community. Factors affecting potential to achieve goals and functional outcome are N/A. Patient will benefit from skilled SLP services to address above impairments and improve overall function.  REHAB POTENTIAL: Good  PLAN:  SLP FREQUENCY: 2x/week  SLP DURATION: 8 weeks  PLANNED INTERVENTIONS: Language facilitation, SLP instruction and feedback, Compensatory strategies, and Patient/family education  Shaney Deckman B. Rutherford Nail, M.S., CCC-SLP, Mining engineer Certified Brain Injury [place]  [place] Office [phone] Ascom [phone] Fax [phone]

## 2022-04-08 ENCOUNTER — Ambulatory Visit: Payer: Medicare HMO | Admitting: Speech Pathology

## 2022-04-10 ENCOUNTER — Ambulatory Visit: Payer: Medicare HMO | Admitting: Speech Pathology

## 2022-04-11 ENCOUNTER — Other Ambulatory Visit: Payer: Self-pay | Admitting: Nurse Practitioner

## 2022-04-11 ENCOUNTER — Ambulatory Visit: Payer: Medicare HMO | Admitting: Speech Pathology

## 2022-04-11 DIAGNOSIS — K219 Gastro-esophageal reflux disease without esophagitis: Secondary | ICD-10-CM

## 2022-04-14 ENCOUNTER — Ambulatory Visit: Payer: Medicare HMO | Admitting: Speech Pathology

## 2022-04-16 ENCOUNTER — Ambulatory Visit: Payer: Medicare HMO | Attending: Student | Admitting: Speech Pathology

## 2022-04-21 ENCOUNTER — Ambulatory Visit: Payer: Medicare HMO | Admitting: Speech Pathology

## 2022-04-24 ENCOUNTER — Ambulatory Visit: Payer: Medicare HMO | Admitting: Speech Pathology

## 2022-04-26 ENCOUNTER — Ambulatory Visit
Admission: EM | Admit: 2022-04-26 | Discharge: 2022-04-26 | Disposition: A | Discharge status: Home / Self Care | Payer: Medicare HMO | Attending: Physician Assistant | Admitting: Physician Assistant

## 2022-04-26 DIAGNOSIS — N453 Epididymo-orchitis: Secondary | ICD-10-CM | POA: Diagnosis not present

## 2022-04-26 DIAGNOSIS — R300 Dysuria: Secondary | ICD-10-CM

## 2022-04-26 DIAGNOSIS — N3000 Acute cystitis without hematuria: Secondary | ICD-10-CM | POA: Diagnosis not present

## 2022-04-26 LAB — URINALYSIS, MICROSCOPIC (REFLEX): WBC, UA: >50 WBC/hpf (ref 0–5)

## 2022-04-26 LAB — URINALYSIS, ROUTINE W REFLEX MICROSCOPIC
Bilirubin Urine: NEGATIVE
Glucose, UA: NEGATIVE mg/dL
Ketones, ur: NEGATIVE mg/dL
Nitrite: NEGATIVE
Specific Gravity, Urine: 1.015 (ref 1.005–1.030)
pH: 5.5 (ref 5.0–8.0)

## 2022-04-26 MED ORDER — CEFTRIAXONE SODIUM 1 G IJ SOLR
1.0000 g | Freq: Once | INTRAMUSCULAR | Status: AC
  Administered 2022-04-26: 1 g via INTRAMUSCULAR

## 2022-04-26 MED ORDER — LEVOFLOXACIN 750 MG PO TABS: 750.0000 mg | ORAL_TABLET | Freq: Every day | ORAL | 0 refills | Status: AC

## 2022-04-26 NOTE — ED Triage Notes (Signed)
Pt c/o left side testicle swelling x2days  Pt denies hurting himself or doing anything different.  Pt states that his urine has stung.

## 2022-04-26 NOTE — Discharge Instructions (Signed)
-  You have a urinary infection/scrotal infection.  We have given you an injection of an antibiotic this evening and I have also sent an oral antibiotic to the pharmacy.  Start that antibiotic ASAP.  Elevate your scrotum.  Put a pillow underneath your scrotum and ice it for 10 to 15 minutes every couple of hours.  Take Tylenol as needed for pain relief. - As discussed, this should make a drastic improvement in the next 2 days.  If it does not or you develop a fever or have increased swelling or pain you need to go to the emergency department immediately.  Consider following up with your PCP this coming week as well if possible. - Plenty of rest and fluids to flush things out.  We have sent the urine for culture and we will call you if we need to change anything with medication.  As discussed, if your symptoms are worsening or not improving, you will need to go to the ER for IV antibiotics.

## 2022-04-26 NOTE — ED Provider Notes (Signed)
[facility]    CSN: [id number] Arrival date & time: 04/26/22  1109      History   Chief Complaint Chief Complaint  Patient presents with   Groin Swelling    HPI [name] Sr. is a 76 y.o. male presenting for 2-day history of swelling of the left side of his scrotum, dysuria and difficulty urinating.  Also reporting some urinary frequency.  He says that his scrotum is red and warm.  He says it is very tender to touch.  He denies any sort of injury.  He also denies fever, fatigue, sweats, chills, pelvic pain or abdominal pain.  His medical history significant for diabetes, GERD, hypertension, hyperlipidemia, recent stroke in August 2023, chronic anticoagulation use and history of prostate cancer.  HPI  Past Medical History:  Diagnosis Date   Diabetes mellitus without complication ([place])    GERD (gastroesophageal reflux disease)    Hyperlipidemia    Hypertension     Patient Active Problem List   Diagnosis Date Noted   Mixed hyperlipidemia    Acute ischemic stroke ([place]) 01/01/2022   Diabetes mellitus without complication (HCC)    PAF (paroxysmal atrial fibrillation) (HCC)    Chronic anticoagulation    History of cardiac pacemaker    History of prostate cancer    Cellulitis 06/11/2020   Screening for colon cancer 11/09/2019   Sebaceous cyst of ear 11/09/2019   Encounter for general adult medical examination with abnormal findings 06/23/2019   Gout 06/23/2019   Other fatigue 05/14/2019   Type 2 diabetes mellitus with hyperglycemia ([place]) 12/13/2018   Atopic dermatitis 12/13/2018   Coronary artery disease involving native heart 05/23/2018   Right upper quadrant abdominal pain 05/23/2018   Bradycardia 04/30/2018   Essential hypertension 05/18/2017   Gastroesophageal reflux disease without esophagitis 05/18/2017   Prostate cancer ([place]) 06/26/2016   Anejaculation 10/18/2012   Erectile dysfunction following radiation therapy 10/18/2012   Microscopic hematuria  04/19/2012   Testicular hypofunction 04/19/2012    Past Surgical History:  Procedure Laterality Date   CARDIAC CATHETERIZATION     COLONOSCOPY WITH PROPOFOL N/A 01/24/2020   Procedure: COLONOSCOPY WITH PROPOFOL;  Surgeon: [name], MD;  Location: [facility] ENDOSCOPY;  Service: Gastroenterology;  Laterality: N/A;   CORONARY/GRAFT ACUTE MI REVASCULARIZATION N/A 05/02/2018   Procedure: Coronary/Graft Acute MI Revascularization;  Surgeon: [name], MD;  Location: [place] [facility];  Service: Cardiovascular;  Laterality: N/A;   HERNIA REPAIR     LEFT HEART CATH AND CORONARY ANGIOGRAPHY N/A 05/02/2018   Procedure: LEFT HEART CATH AND CORONARY ANGIOGRAPHY;  Surgeon: [name], MD;  Location: [place] [facility];  Service: Cardiovascular;  Laterality: N/A;   PACEMAKER INSERTION Left 05/03/2018   Procedure: INSERTION PACEMAKER;  Surgeon: [name], MD;  Location: [facility] ORS;  Service: Cardiovascular;  Laterality: Left;   RIGHT/LEFT HEART CATH AND CORONARY ANGIOGRAPHY Bilateral 12/30/2021   Procedure: RIGHT/LEFT HEART CATH AND CORONARY ANGIOGRAPHY;  Surgeon: [name], MD;  Location: [place] [facility];  Service: Cardiovascular;  Laterality: Bilateral;   TEMPORARY PACEMAKER N/A 05/02/2018   Procedure: TEMPORARY PACEMAKER;  Surgeon: [name], MD;  Location: [place] [facility];  Service: Cardiovascular;  Laterality: N/A;   TEMPORARY PACEMAKER Right 05/03/2018   Procedure: TEMPORARY PACEMAKER REMOVAL;  Surgeon: [name], MD;  Location: [facility] ORS;  Service: Cardiovascular;  Laterality: Right;   TONSILLECTOMY Bilateral    1992       Home Medications    Prior  to Admission medications   Medication Sig Start Date End Date Taking? Authorizing Provider  albuterol (VENTOLIN HFA) 108 (90 Base) MCG/ACT inhaler INHALE 2 INHALATIONS INTO THE LUNGS EVERY 4 HOURS AS NEEDED FOR WHEEZE OR FOR SHORTNESS OF BREATH 12/24/21  Yes [provider]   allopurinol (ZYLOPRIM) 300 MG tablet Take 1 tablet (300 mg total) by mouth daily. 11/13/21  Yes Abernathy, Yetta Flock, NP  amoxicillin-clavulanate (AUGMENTIN) 875-125 MG tablet Take 1 tablet by mouth 2 (two) times daily. Take with food. 03/24/22  Yes McDonough, Si Gaul, PA-C  aspirin EC 81 MG tablet Take by mouth.   Yes [provider]  carvedilol (COREG) 25 MG tablet Take 25 mg by mouth 2 (two) times daily. 04/11/21  Yes [provider]  colchicine 0.6 MG tablet FOR ACUTE GOUT ATTACK, TAKE 2 TABLETS NOW THEN TAKE 1 MORE TABLET 1 HOUR LATER ON THE FIRST DAY. THEN TAKE 1 TABLET DAILY UNTIL FLARE UP RESOLVES. 12/19/21  Yes Abernathy, Yetta Flock, NP  diltiazem (CARDIZEM CD) 240 MG 24 hr capsule Take 240 mg by mouth daily. 11/27/21  Yes [provider]  esomeprazole (NEXIUM) 40 MG capsule TAKE 1 CAPSULE (40 MG TOTAL) BY MOUTH DAILY AT 12 NOON. 04/11/22  Yes Abernathy, Yetta Flock, NP  furosemide (LASIX) 20 MG tablet Take 20 mg by mouth daily. 12/21/21  Yes [provider]  KLOR-CON M10 10 MEQ tablet Take 10 mEq by mouth daily. 10/31/21  Yes [provider]  levofloxacin (LEVAQUIN) 750 MG tablet Take 1 tablet (750 mg total) by mouth daily for 14 days. 04/26/22 05/10/22 Yes Danton Clap, PA-C  losartan-hydrochlorothiazide (HYZAAR) 100-12.5 MG tablet Take 1 tablet by mouth daily. 01/15/22  Yes Abernathy, Yetta Flock, NP  metFORMIN (GLUCOPHAGE-XR) 750 MG 24 hr tablet Take 1 tablet (750 mg total) by mouth daily with breakfast. 11/13/21  Yes Abernathy, Alyssa, NP  simvastatin (ZOCOR) 40 MG tablet Take 40 mg by mouth daily. 01/02/22  Yes [provider]  tamsulosin (FLOMAX) 0.4 MG CAPS capsule TAKE 1 CAPSULE BY MOUTH EVERY DAY 03/29/22  Yes Lavera Guise, MD  apixaban (ELIQUIS) 5 MG TABS tablet Take 1 tablet (5 mg total) by mouth 2 (two) times daily. 01/02/22 02/25/22  Max Sane, MD  atorvastatin (LIPITOR) 40 MG tablet Take 1 tablet (40 mg total) by mouth daily. 01/02/22 02/25/22   Max Sane, MD    Family History Family History  Problem Relation Age of Onset   Cirrhosis Father    Bone cancer Mother     Social History Social History   Tobacco Use   Smoking status: Former    Types: Cigarettes   Smokeless tobacco: Never  Vaping Use   Vaping Use: Never used  Substance Use Topics   Alcohol use: No   Drug use: No     Allergies   Patient has no known allergies.   Review of Systems Review of Systems  Constitutional:  Negative for fatigue and fever.  Gastrointestinal:  Negative for abdominal pain, nausea and vomiting.  Genitourinary:  Positive for difficulty urinating, dysuria, frequency, scrotal swelling and testicular pain. Negative for flank pain, genital sores, hematuria, penile discharge, penile pain, penile swelling and urgency.  Musculoskeletal:  Negative for arthralgias.  Skin:  Negative for rash.  Neurological:  Negative for weakness.     Physical Exam Triage Vital Signs ED Triage Vitals  Enc Vitals Group     BP 04/26/22 1324 (!) 152/84     Pulse Rate 04/26/22 1324 72  Resp 04/26/22 1324 20     Temp 04/26/22 1324 98.4 F (36.9 C)     Temp Source 04/26/22 1324 Oral     SpO2 04/26/22 1324 97 %     Weight 04/26/22 1322 240 lb (108.9 kg)     Height 04/26/22 1322 '6\' 3"'$  (1.905 m)     Head Circumference --      Peak Flow --      Pain Score 04/26/22 1322 10     Pain Loc --      Pain Edu? --      Excl. in [place]? --    No data found.  Updated Vital Signs BP (!) 152/84 (BP Location: Left Arm)   Pulse 72   Temp 98.4 F (36.9 C) (Oral)   Resp 20   Ht '6\' 3"'$  (1.905 m)   Wt 240 lb (108.9 kg)   SpO2 97%   BMI 30.00 kg/m   Physical Exam Vitals and nursing note reviewed. Exam conducted with a chaperone present.  Constitutional:      General: He is not in acute distress.    Appearance: Normal appearance. He is well-developed. He is not ill-appearing.  HENT:     Head: Normocephalic and atraumatic.  Eyes:     General: No scleral  icterus.    Conjunctiva/sclera: Conjunctivae normal.  Cardiovascular:     Rate and Rhythm: Normal rate and regular rhythm.     Heart sounds: Normal heart sounds.  Pulmonary:     Effort: Pulmonary effort is normal. No respiratory distress.     Breath sounds: Normal breath sounds.  Abdominal:     Palpations: Abdomen is soft.     Tenderness: There is no abdominal tenderness.  Genitourinary:    Penis: Uncircumcised.      Testes:        Left: Swelling present.     Epididymis:     Left: Enlarged. Tenderness present.     Comments: Significant swelling, erythema and warmth of the left testicle/scrotum.  Significantly tender to palpation.  Moderate edema of the penis.  No tenderness to palpation.  Able to fully retract foreskin without difficulty. Musculoskeletal:     Cervical back: Neck supple.  Skin:    General: Skin is warm and dry.     Capillary Refill: Capillary refill takes less than 2 seconds.  Neurological:     General: No focal deficit present.     Mental Status: He is alert. Mental status is at baseline.     Motor: No weakness.     Gait: Gait normal.  Psychiatric:        Mood and Affect: Mood normal.        Behavior: Behavior normal.      UC Treatments / Results  Labs (all labs ordered are listed, but only abnormal results are displayed) Labs Reviewed  URINALYSIS, ROUTINE W REFLEX MICROSCOPIC - Abnormal; Notable for the following components:      Result Value   APPearance HAZY (*)    Hgb urine dipstick SMALL (*)    Protein, ur TRACE (*)    Leukocytes,Ua MODERATE (*)    All other components within normal limits  URINALYSIS, MICROSCOPIC (REFLEX) - Abnormal; Notable for the following components:   Bacteria, UA MANY (*)    All other components within normal limits  URINE CULTURE    EKG   Radiology No results found.  Procedures Procedures (including critical care time)  Medications Ordered in UC Medications  cefTRIAXone (ROCEPHIN) injection  1 g (has no  administration in time range)    Initial Impression / Assessment and Plan / UC Course  I have reviewed the triage vital signs and the nursing notes.  Pertinent labs & imaging results that were available during my care of the patient were reviewed by me and considered in my medical decision making (see chart for details).   76 year old male presents for dysuria, difficulty urinating, frequency, left scrotal pain, swelling, warmth and redness for the past 2 days.  Denies injury.  No similar symptoms in the past.  On exam he has significant swelling, erythema, warmth and tenderness of the left testicle and scrotum.  He also has moderate edema of the penis but I am able to fully retract the foreskin.  No urethral discharge.  Urinalysis shows hazy urine with small hemoglobin, moderate leukocytes, many bacteria and white blood cell clumps.  Will send urine for culture.  Discussed urine results with patient.  Suspect epididymo-orchitis/urinary tract infection.  I am concerned about the level of swelling in his comorbidities.  Advised very close monitoring.  Will treat patient in clinic with a gram of Rocephin and started patient on Levaquin x 14 days.  Advised patient that his symptoms should drastically improve over the next 2 days.  Advised him to elevate and ice the scrotum and take Tylenol for pain.  Advised that he has any worsening of symptoms, lack of improvement in symptoms, increased swelling or pain, fever and he should go immediately to the emergency department for further evaluation which may include ultrasound evaluation possible underlying scrotal abscess and need for IV antibiotics.  Otherwise, advised to follow-up with his PCP next week if possible.   Final Clinical Impressions(s) / UC Diagnoses   Final diagnoses:  Epididymo-orchitis  Dysuria  Acute cystitis without hematuria     Discharge Instructions      -You have a urinary infection/scrotal infection.  We have given you an  injection of an antibiotic this evening and I have also sent an oral antibiotic to the pharmacy.  Start that antibiotic ASAP.  Elevate your scrotum.  Put a pillow underneath your scrotum and ice it for 10 to 15 minutes every couple of hours.  Take Tylenol as needed for pain relief. - As discussed, this should make a drastic improvement in the next 2 days.  If it does not or you develop a fever or have increased swelling or pain you need to go to the emergency department immediately.  Consider following up with your PCP this coming week as well if possible. - Plenty of rest and fluids to flush things out.  We have sent the urine for culture and we will call you if we need to change anything with medication.  As discussed, if your symptoms are worsening or not improving, you will need to go to the ER for IV antibiotics.     ED Prescriptions     Medication Sig Dispense Auth. Provider   levofloxacin (LEVAQUIN) 750 MG tablet Take 1 tablet (750 mg total) by mouth daily for 14 days. 14 tablet [name]      PDMP not reviewed this encounter.   Danton Clap, PA-C 04/26/22 1441

## 2022-04-29 LAB — URINE CULTURE: Culture: >=100000 — AB

## 2022-04-30 ENCOUNTER — Encounter: Payer: Self-pay | Admitting: Nurse Practitioner

## 2022-04-30 ENCOUNTER — Ambulatory Visit (INDEPENDENT_AMBULATORY_CARE_PROVIDER_SITE_OTHER): Payer: Medicare HMO | Admitting: Nurse Practitioner

## 2022-04-30 VITALS — BP 128/60 | HR 77 | Temp 97.5°F | Resp 16 | Ht 75.5 in | Wt 248.0 lb

## 2022-04-30 DIAGNOSIS — N390 Urinary tract infection, site not specified: Secondary | ICD-10-CM

## 2022-04-30 DIAGNOSIS — N451 Epididymitis: Secondary | ICD-10-CM

## 2022-04-30 LAB — POCT URINALYSIS DIPSTICK
Bilirubin, UA: NEGATIVE
Glucose, UA: NEGATIVE
Ketones, UA: NEGATIVE
Leukocytes, UA: NEGATIVE
Nitrite, UA: NEGATIVE
Protein, UA: NEGATIVE
Spec Grav, UA: 1.01 (ref 1.010–1.025)
Urobilinogen, UA: 0.2 E.U./dL
pH, UA: 5 (ref 5.0–8.0)

## 2022-04-30 MED ORDER — NITROFURANTOIN MONOHYD MACRO 100 MG PO CAPS: 100.0000 mg | ORAL_CAPSULE | Freq: Two times a day (BID) | ORAL | 0 refills | Status: AC

## 2022-04-30 NOTE — Progress Notes (Signed)
[facility] [place], [place] [address]  Internal MEDICINE  Office Visit Note  Patient Name: [name]  [id number]  [id number]  Date of Service: 04/30/2022  Chief Complaint  Patient presents with  . Acute Visit    Sinuses issues and uti      HPI [name] presents for an acute sick visit for epididymitis seen by UC for epididymitis and was put on levofloxacin but urine culture a resistant to ciprofloxacin, bactrim, and ampicillin.      Current Medication:  Outpatient Encounter Medications as of 04/30/2022  Medication Sig  . albuterol (VENTOLIN HFA) 108 (90 Base) MCG/ACT inhaler INHALE 2 INHALATIONS INTO THE LUNGS EVERY 4 HOURS AS NEEDED FOR WHEEZE OR FOR SHORTNESS OF BREATH  . allopurinol (ZYLOPRIM) 300 MG tablet Take 1 tablet (300 mg total) by mouth daily.  Marland Kitchen amoxicillin-clavulanate (AUGMENTIN) 875-125 MG tablet Take 1 tablet by mouth 2 (two) times daily. Take with food.  Marland Kitchen aspirin EC 81 MG tablet Take by mouth.  . carvedilol (COREG) 25 MG tablet Take 25 mg by mouth 2 (two) times daily.  . colchicine 0.6 MG tablet FOR ACUTE GOUT ATTACK, TAKE 2 TABLETS NOW THEN TAKE 1 MORE TABLET 1 HOUR LATER ON THE FIRST DAY. THEN TAKE 1 TABLET DAILY UNTIL FLARE UP RESOLVES.  Marland Kitchen diltiazem (CARDIZEM CD) 240 MG 24 hr capsule Take 240 mg by mouth daily.  Marland Kitchen esomeprazole (NEXIUM) 40 MG capsule TAKE 1 CAPSULE (40 MG TOTAL) BY MOUTH DAILY AT 12 NOON.  . furosemide (LASIX) 20 MG tablet Take 20 mg by mouth daily.  Marland Kitchen KLOR-CON M10 10 MEQ tablet Take 10 mEq by mouth daily.  Marland Kitchen levofloxacin (LEVAQUIN) 750 MG tablet Take 1 tablet (750 mg total) by mouth daily for 14 days.  Marland Kitchen losartan-hydrochlorothiazide (HYZAAR) 100-12.5 MG tablet Take 1 tablet by mouth daily.  . metFORMIN (GLUCOPHAGE-XR) 750 MG 24 hr tablet Take 1 tablet (750 mg total) by mouth daily with breakfast.  . simvastatin (ZOCOR) 40 MG tablet Take 40 mg by mouth daily.  . tamsulosin (FLOMAX) 0.4 MG CAPS capsule TAKE 1  CAPSULE BY MOUTH EVERY DAY  . apixaban (ELIQUIS) 5 MG TABS tablet Take 1 tablet (5 mg total) by mouth 2 (two) times daily.  Marland Kitchen atorvastatin (LIPITOR) 40 MG tablet Take 1 tablet (40 mg total) by mouth daily.   No facility-administered encounter medications on file as of 04/30/2022.      Medical History: Past Medical History:  Diagnosis Date  . Diabetes mellitus without complication ([employer])   . GERD (gastroesophageal reflux disease)   . Hyperlipidemia   . Hypertension      Vital Signs: BP 128/60   Pulse 77   Temp (!) 97.5 F (36.4 C)   Resp 16   Ht 6' 3.5" (1.918 m)   Wt 248 lb (112.5 kg)   SpO2 98%   BMI 30.59 kg/m    Review of Systems  Physical Exam    Assessment/Plan:   General Counseling: [name] verbalizes understanding of the findings of todays visit and agrees with plan of treatment. I have discussed any further diagnostic evaluation that may be needed or ordered today. We also reviewed his medications today. he has been encouraged to call the office with any questions or concerns that should arise related to todays visit.    Counseling:    Orders Placed This Encounter  Procedures  . POCT urinalysis dipstick    No orders of the defined types were placed  in this encounter.   No follow-ups on file.  [place] Controlled Substance Database was reviewed by me for overdose risk score (ORS)  Time spent:*** Minutes Time spent with patient included reviewing progress notes, labs, imaging studies, and discussing plan for follow up.   This patient was seen by [name], FNP-C in collaboration with Dr. Clayborn Bigness as a part of collaborative care agreement.  Karder Goodin R. Valetta Fuller, MSN, FNP-C Internal Medicine

## 2022-05-01 ENCOUNTER — Encounter: Payer: Medicare HMO | Admitting: Speech Pathology

## 2022-05-01 ENCOUNTER — Telehealth (HOSPITAL_COMMUNITY): Payer: Self-pay | Admitting: Emergency Medicine

## 2022-05-01 ENCOUNTER — Ambulatory Visit: Payer: Medicare HMO | Admitting: Nurse Practitioner

## 2022-05-01 NOTE — Telephone Encounter (Signed)
Opened in error

## 2022-05-06 ENCOUNTER — Ambulatory Visit: Payer: Medicare HMO | Admitting: Speech Pathology

## 2022-05-07 ENCOUNTER — Telehealth: Payer: Self-pay

## 2022-05-08 ENCOUNTER — Encounter: Payer: Medicare HMO | Admitting: Speech Pathology

## 2022-05-13 ENCOUNTER — Encounter: Payer: Medicare HMO | Admitting: Speech Pathology

## 2022-05-15 ENCOUNTER — Encounter: Payer: Medicare HMO | Admitting: Speech Pathology

## 2022-05-16 NOTE — Telephone Encounter (Signed)
Spoke to patient, he stated it was a little better and that he has an appointment on 05/26/22 at the urology.

## 2022-05-20 ENCOUNTER — Other Ambulatory Visit: Payer: Self-pay | Admitting: Nurse Practitioner

## 2022-05-20 ENCOUNTER — Encounter: Payer: Medicare HMO | Admitting: Speech Pathology

## 2022-05-20 DIAGNOSIS — E1165 Type 2 diabetes mellitus with hyperglycemia: Secondary | ICD-10-CM

## 2022-05-22 ENCOUNTER — Encounter: Payer: Medicare HMO | Admitting: Speech Pathology

## 2022-05-26 ENCOUNTER — Encounter: Payer: Medicare HMO | Admitting: Speech Pathology

## 2022-05-26 ENCOUNTER — Encounter: Payer: Self-pay | Admitting: Urology

## 2022-05-26 ENCOUNTER — Ambulatory Visit: Payer: Medicare HMO | Admitting: Urology

## 2022-05-26 VITALS — BP 123/78 | HR 80 | Ht 75.0 in | Wt 245.0 lb

## 2022-05-26 DIAGNOSIS — Z8546 Personal history of malignant neoplasm of prostate: Secondary | ICD-10-CM

## 2022-05-26 DIAGNOSIS — Z950 Presence of cardiac pacemaker: Secondary | ICD-10-CM | POA: Diagnosis not present

## 2022-05-26 DIAGNOSIS — N3941 Urge incontinence: Secondary | ICD-10-CM

## 2022-05-26 DIAGNOSIS — N390 Urinary tract infection, site not specified: Secondary | ICD-10-CM

## 2022-05-26 DIAGNOSIS — R399 Unspecified symptoms and signs involving the genitourinary system: Secondary | ICD-10-CM | POA: Diagnosis not present

## 2022-05-26 DIAGNOSIS — R972 Elevated prostate specific antigen [PSA]: Secondary | ICD-10-CM | POA: Diagnosis not present

## 2022-05-26 LAB — URINALYSIS, COMPLETE
Bilirubin, UA: NEGATIVE
Glucose, UA: NEGATIVE
Ketones, UA: NEGATIVE
Leukocytes,UA: NEGATIVE
Nitrite, UA: NEGATIVE
Protein,UA: NEGATIVE
RBC, UA: NEGATIVE
Specific Gravity, UA: 1.01 (ref 1.005–1.030)
Urobilinogen, Ur: 0.2 mg/dL (ref 0.2–1.0)
pH, UA: 6 (ref 5.0–7.5)

## 2022-05-26 LAB — MICROSCOPIC EXAMINATION

## 2022-05-26 MED ORDER — MIRABEGRON ER 50 MG PO TB24: 50.0000 mg | ORAL_TABLET | Freq: Every day | ORAL | 0 refills | Status: DC

## 2022-05-26 NOTE — Progress Notes (Unsigned)
05/26/2022 10:13 AM   Gary Alar Sr. 02-21-1946 [id number]  Referring provider: [name], NP [place],  [place] [address]  Chief Complaint  Patient presents with   Recurrent UTI    HPI: Gary NAKAMA Sr. is a 77 y.o. male presents in follow-up for recent urgent care visit for UTI/epididymoorchitis.  Seen [facility] 04/26/2022 with a 2-day history of urinary hesitancy, dysuria and left hemiscrotal swelling with tenderness.  Exam remarkable for erythema, swelling and tenderness of the left hemiscrotum.  Dipstick UA with moderate leukocytes  Was given an IM injection ceftriaxone and treated with a 14-day course of Levaquin Urine culture grew >100,000 colonies E. coli which was resistant to Cipro, sulfa, ampicillin and Augmentin PCP follow-up 04/30/2022 with improvement in symptoms.  He was continued on Levaquin and nitrofurantoin was added. Today he has no complaints and states his pain, swelling and symptoms have resolved. History T1c intermediate risk prostate cancer diagnosed October 2017.  Treated at [facility] with CyberKnife radiation March 2018 He was last seen by me 05/28/2017.  Last PSA January 2021 was undetectable at < 0.1 He has had frequency, urgency with urge incontinence since completing radiation   PMH: Past Medical History:  Diagnosis Date   Diabetes mellitus without complication (HCC)    GERD (gastroesophageal reflux disease)    Hyperlipidemia    Hypertension     Surgical History: Past Surgical History:  Procedure Laterality Date   CARDIAC CATHETERIZATION     COLONOSCOPY WITH PROPOFOL N/A 01/24/2020   Procedure: COLONOSCOPY WITH PROPOFOL;  Surgeon: [name], MD;  Location: [facility] ENDOSCOPY;  Service: Gastroenterology;  Laterality: N/A;   CORONARY/GRAFT ACUTE MI REVASCULARIZATION N/A 05/02/2018   Procedure: Coronary/Graft Acute MI Revascularization;  Surgeon: [name], MD;  Location: [facility];  Service:  Cardiovascular;  Laterality: N/A;   HERNIA REPAIR     LEFT HEART CATH AND CORONARY ANGIOGRAPHY N/A 05/02/2018   Procedure: LEFT HEART CATH AND CORONARY ANGIOGRAPHY;  Surgeon: [name], MD;  Location: [place] [facility];  Service: Cardiovascular;  Laterality: N/A;   PACEMAKER INSERTION Left 05/03/2018   Procedure: INSERTION PACEMAKER;  Surgeon: [name], MD;  Location: [facility] ORS;  Service: Cardiovascular;  Laterality: Left;   RIGHT/LEFT HEART CATH AND CORONARY ANGIOGRAPHY Bilateral 12/30/2021   Procedure: RIGHT/LEFT HEART CATH AND CORONARY ANGIOGRAPHY;  Surgeon: [name], MD;  Location: [facility];  Service: Cardiovascular;  Laterality: Bilateral;   TEMPORARY PACEMAKER N/A 05/02/2018   Procedure: TEMPORARY PACEMAKER;  Surgeon: [name], MD;  Location: [place] [facility];  Service: Cardiovascular;  Laterality: N/A;   TEMPORARY PACEMAKER Right 05/03/2018   Procedure: TEMPORARY PACEMAKER REMOVAL;  Surgeon: [name], MD;  Location: [facility] ORS;  Service: Cardiovascular;  Laterality: Right;   TONSILLECTOMY Bilateral    1992    Home Medications:  Allergies as of 05/26/2022   No Known Allergies      Medication List        Accurate as of May 26, 2022 10:13 AM. If you have any questions, ask your nurse or doctor.          albuterol 108 (90 Base) MCG/ACT inhaler Commonly known as: VENTOLIN HFA INHALE 2 INHALATIONS INTO THE LUNGS EVERY 4 HOURS AS NEEDED FOR WHEEZE OR FOR SHORTNESS OF BREATH   allopurinol 300 MG tablet Commonly known as: ZYLOPRIM Take 1 tablet (300 mg total) by mouth daily.   amoxicillin-clavulanate 875-125 MG tablet Commonly known as:  AUGMENTIN Take 1 tablet by mouth 2 (two) times daily. Take with food.   apixaban 5 MG Tabs tablet Commonly known as: ELIQUIS Take 1 tablet (5 mg total) by mouth 2 (two) times daily.   aspirin EC 81 MG tablet Take by mouth.   atorvastatin 40 MG tablet Commonly known  as: Lipitor Take 1 tablet (40 mg total) by mouth daily.   carvedilol 25 MG tablet Commonly known as: COREG Take 25 mg by mouth 2 (two) times daily.   colchicine 0.6 MG tablet FOR ACUTE GOUT ATTACK, TAKE 2 TABLETS NOW THEN TAKE 1 MORE TABLET 1 HOUR LATER ON THE FIRST DAY. THEN TAKE 1 TABLET DAILY UNTIL FLARE UP RESOLVES.   diltiazem 240 MG 24 hr capsule Commonly known as: CARDIZEM CD Take 240 mg by mouth daily.   esomeprazole 40 MG capsule Commonly known as: NEXIUM TAKE 1 CAPSULE (40 MG TOTAL) BY MOUTH DAILY AT 12 NOON.   furosemide 20 MG tablet Commonly known as: LASIX Take 20 mg by mouth daily.   Klor-Con M10 10 MEQ tablet Generic drug: potassium chloride Take 10 mEq by mouth daily.   losartan-hydrochlorothiazide 100-12.5 MG tablet Commonly known as: HYZAAR Take 1 tablet by mouth daily.   metFORMIN 750 MG 24 hr tablet Commonly known as: GLUCOPHAGE-XR TAKE 1 TABLET BY MOUTH EVERY DAY WITH BREAKFAST   mirabegron ER 50 MG Tb24 tablet Commonly known as: Myrbetriq Take 1 tablet (50 mg total) by mouth daily. Started by: [name], MD   simvastatin 40 MG tablet Commonly known as: ZOCOR Take 40 mg by mouth daily.   tamsulosin 0.4 MG Caps capsule Commonly known as: FLOMAX TAKE 1 CAPSULE BY MOUTH EVERY DAY        Allergies: No Known Allergies  Family History: Family History  Problem Relation Age of Onset   Cirrhosis Father    Bone cancer Mother     Social History:  reports that he has quit smoking. His smoking use included cigarettes. He has never used smokeless tobacco. He reports that he does not drink alcohol and does not use drugs.   Physical Exam: BP 123/78   Pulse 80   Ht '6\' 3"'$  (1.905 m)   Wt 245 lb (111.1 kg)   BMI 30.62 kg/m   Constitutional:  Alert and oriented, No acute distress. HEENT: [place] AT, moist mucus membranes.  Trachea midline, no masses. Cardiovascular: No clubbing, cyanosis, or edema. Respiratory: Normal respiratory effort, no  increased work of breathing. GI: Abdomen is soft, nontender, nondistended, no abdominal masses GU: Phallus without lesions.  Testes descended bilaterally without masses, tenderness, induration or enlargement.  Epididymis is palpably normal Psychiatric: Normal mood and affect.  Laboratory Data: Lab Results  Component Value Date   WBC 4.9 02/25/2022   HGB 13.8 02/25/2022   HCT 42.2 02/25/2022   MCV 82.1 02/25/2022   PLT 205 02/25/2022    Lab Results  Component Value Date   CREATININE 1.17 02/25/2022    No results found for: "PSA"  Lab Results  Component Value Date   TESTOSTERONE 63 (L) 04/01/2017    Lab Results  Component Value Date   HGBA1C 7.2 (A) 11/13/2021    Urinalysis    Component Value Date/Time   COLORURINE YELLOW 04/26/2022 1325   APPEARANCEUR HAZY (A) 04/26/2022 1325   APPEARANCEUR Clear 08/16/2021 1019   LABSPEC 1.015 04/26/2022 1325   PHURINE 5.5 04/26/2022 1325   GLUCOSEU NEGATIVE 04/26/2022 1325   HGBUR SMALL (A) 04/26/2022 1325  BILIRUBINUR Negative 04/30/2022 1113   BILIRUBINUR Negative 08/16/2021 1019   KETONESUR NEGATIVE 04/26/2022 1325   PROTEINUR Negative 04/30/2022 1113   PROTEINUR TRACE (A) 04/26/2022 1325   UROBILINOGEN 0.2 04/30/2022 1113   NITRITE Negative 04/30/2022 1113   NITRITE NEGATIVE 04/26/2022 1325   LEUKOCYTESUR Negative 04/30/2022 1113   LEUKOCYTESUR MODERATE (A) 04/26/2022 1325    Lab Results  Component Value Date   LABMICR Comment 08/16/2021   WBCUA None seen 08/16/2021   RBCUA 0-2 06/21/2018   LABEPIT None seen 08/16/2021   MUCUS Present 06/21/2018   BACTERIA MANY (A) 04/26/2022    Pertinent Imaging: *** No results found for this or any previous visit.  No results found for this or any previous visit.  No results found for this or any previous visit.  No results found for this or any previous visit.  No results found for this or any previous visit.  No valid procedures specified. No results found for  this or any previous visit.  No results found for this or any previous visit.   Assessment & Plan:    1.  Left epididymoorchitis Resolved.  Nitrofurantoin does not achieve adequate tissue levels for epididymitis and he has responded to the Levaquin and ceftriaxone UA today clear Instructed to call for recurrent scrotal pain/swelling  2. History of prostate cancer Last PSA 2022.  PSA drawn today  3.  Lower urinary tract symptoms with urge incontinence Present since he completed radiation therapy Trial Myrbetriq 50 mg daily-samples given   No follow-ups on file.***?  [name], [place] [address], [place], [place] [address] [phone]

## 2022-05-27 LAB — PSA: Prostate Specific Ag, Serum: <0.1 ng/mL (ref 0.0–4.0)

## 2022-05-28 ENCOUNTER — Telehealth: Payer: Self-pay | Admitting: *Deleted

## 2022-05-28 ENCOUNTER — Encounter: Payer: Self-pay | Admitting: Urology

## 2022-05-28 ENCOUNTER — Encounter: Payer: Medicare HMO | Admitting: Speech Pathology

## 2022-05-28 NOTE — Telephone Encounter (Signed)
-----  Message from [name], MD sent at 05/28/2022  7:36 AM EST ----- PSA remains undetectable at <0.1

## 2022-05-28 NOTE — Telephone Encounter (Signed)
Notified patient as instructed, patient pleased °

## 2022-06-03 ENCOUNTER — Encounter: Payer: Medicare HMO | Admitting: Speech Pathology

## 2022-06-05 ENCOUNTER — Encounter: Payer: Medicare HMO | Admitting: Speech Pathology

## 2022-06-10 ENCOUNTER — Encounter: Payer: Medicare HMO | Admitting: Speech Pathology

## 2022-06-12 ENCOUNTER — Encounter: Payer: Medicare HMO | Admitting: Speech Pathology

## 2022-06-17 ENCOUNTER — Encounter: Payer: Medicare HMO | Admitting: Speech Pathology

## 2022-06-19 ENCOUNTER — Encounter: Payer: Medicare HMO | Admitting: Speech Pathology

## 2022-06-24 ENCOUNTER — Encounter: Payer: Medicare HMO | Admitting: Speech Pathology

## 2022-06-26 ENCOUNTER — Encounter: Payer: Medicare HMO | Admitting: Speech Pathology

## 2022-07-01 ENCOUNTER — Encounter: Payer: Medicare HMO | Admitting: Speech Pathology

## 2022-07-03 ENCOUNTER — Encounter: Payer: Medicare HMO | Admitting: Speech Pathology

## 2022-07-03 ENCOUNTER — Telehealth: Payer: Self-pay

## 2022-07-03 NOTE — Telephone Encounter (Signed)
Pt LM on triage line stating he was calling about a medication.   Called pt back to get more details. No answer.

## 2022-07-04 NOTE — Telephone Encounter (Signed)
LMOM asking for patient to call back and give more information on medication.

## 2022-07-07 NOTE — Telephone Encounter (Signed)
Unable to reach patient,unable to leave message.

## 2022-07-10 ENCOUNTER — Telehealth: Payer: Self-pay

## 2022-07-11 MED ORDER — FUROSEMIDE 40 MG PO TABS: 40.0000 mg | ORAL_TABLET | Freq: Every day | ORAL | 3 refills | Status: DC

## 2022-07-11 MED ORDER — KLOR-CON M10 10 MEQ PO TBCR: 20.0000 meq | EXTENDED_RELEASE_TABLET | Freq: Every day | ORAL | 2 refills | Status: DC

## 2022-07-11 NOTE — Telephone Encounter (Signed)
Increased lasix dose and potassium replacement

## 2022-07-30 ENCOUNTER — Telehealth: Payer: Self-pay | Admitting: Urology

## 2022-07-30 ENCOUNTER — Other Ambulatory Visit: Payer: Self-pay | Admitting: *Deleted

## 2022-07-30 DIAGNOSIS — I2510 Atherosclerotic heart disease of native coronary artery without angina pectoris: Secondary | ICD-10-CM | POA: Diagnosis not present

## 2022-07-30 DIAGNOSIS — R001 Bradycardia, unspecified: Secondary | ICD-10-CM | POA: Diagnosis not present

## 2022-07-30 MED ORDER — MIRABEGRON ER 50 MG PO TB24: 50.0000 mg | ORAL_TABLET | Freq: Every day | ORAL | 11 refills | Status: DC

## 2022-07-30 NOTE — Telephone Encounter (Signed)
Medication sent.

## 2022-07-30 NOTE — Telephone Encounter (Signed)
Pt stopped by office to let us know he tried samples of Myrbetriq 50 mg and they worked well for him.  He would like a new RX sent to [facility] in [place].

## 2022-08-19 ENCOUNTER — Ambulatory Visit (INDEPENDENT_AMBULATORY_CARE_PROVIDER_SITE_OTHER): Payer: Medicare HMO | Admitting: Nurse Practitioner

## 2022-08-19 ENCOUNTER — Encounter: Payer: Self-pay | Admitting: Nurse Practitioner

## 2022-08-19 VITALS — BP 140/75 | HR 84 | Temp 97.0°F | Resp 16 | Ht 75.0 in | Wt 252.0 lb

## 2022-08-19 DIAGNOSIS — E782 Mixed hyperlipidemia: Secondary | ICD-10-CM | POA: Diagnosis not present

## 2022-08-19 DIAGNOSIS — E1165 Type 2 diabetes mellitus with hyperglycemia: Secondary | ICD-10-CM

## 2022-08-19 DIAGNOSIS — Z23 Encounter for immunization: Secondary | ICD-10-CM | POA: Diagnosis not present

## 2022-08-19 DIAGNOSIS — R300 Dysuria: Secondary | ICD-10-CM | POA: Diagnosis not present

## 2022-08-19 DIAGNOSIS — M1A0210 Idiopathic chronic gout, right elbow, without tophus (tophi): Secondary | ICD-10-CM | POA: Diagnosis not present

## 2022-08-19 DIAGNOSIS — Z0001 Encounter for general adult medical examination with abnormal findings: Secondary | ICD-10-CM | POA: Diagnosis not present

## 2022-08-19 LAB — POCT GLYCOSYLATED HEMOGLOBIN (HGB A1C): Hemoglobin A1C: 7 % — AB (ref 4.0–5.6)

## 2022-08-19 MED ORDER — ALLOPURINOL 300 MG PO TABS: 300.0000 mg | ORAL_TABLET | Freq: Every day | ORAL | 1 refills | Status: DC

## 2022-08-19 MED ORDER — ZOSTER VAC RECOMB ADJUVANTED 50 MCG/0.5ML IM SUSR: 0.5000 mL | Freq: Once | INTRAMUSCULAR | 0 refills | Status: AC

## 2022-08-19 NOTE — Progress Notes (Signed)
[facility] [address] [place], [place] [address]  Internal MEDICINE  Office Visit Note  Patient Name: [name]  [id number]  [id number]  Date of Service: 08/19/2022  Chief Complaint  Patient presents with   Diabetes   Gastroesophageal Reflux   Hypertension   Hyperlipidemia   Medicare Wellness    HPI Mavrik presents for an annual well visit and physical exam.  Well-appearing 77 y.o. male with  Eye exam: due now, will be seen for eye exam in office today.  foot exam: due today Labs: due for routine labs  New or worsening pain: none  Other concerns: back pain, chronic.  BP stable       08/19/2022    8:53 AM 08/16/2021   10:26 AM 07/30/2020    2:15 PM  MMSE - Mini Mental State Exam  Orientation to time Orientation to Place Registration Attention/ Calculation Recall 3 0 3  Language- name 2 objects Language- repeat Language- follow 3 step command Language- read & follow direction Write a sentence Copy design Total score Functional Status Survey: Is the patient deaf or have difficulty hearing?: No Does the patient have difficulty seeing, even when wearing glasses/contacts?: Yes Does the patient have difficulty concentrating, remembering, or making decisions?: No Does the patient have difficulty walking or climbing stairs?: Yes Does the patient have difficulty dressing or bathing?: No Does the patient have difficulty doing errands alone such as visiting a doctor's office or shopping?: No     12/30/2021   10:54 AM 01/15/2022   11:25 AM 02/25/2022    8:47 AM 04/26/2022    1:24 PM 08/19/2022    8:52 AM  Fall Risk  Falls in the past year?  0   0  Was there an injury with Fall?  0   0  Fall Risk Category Calculator  0   0  Fall Risk Category (Retired)  Low     (RETIRED) Patient Fall Risk Level High fall risk Low fall risk Low fall risk Low fall risk   Patient at  Risk for Falls Due to  No Fall Risks   No Fall Risks  Fall risk Follow up  Falls evaluation completed   Falls evaluation completed       08/19/2022    8:52 AM  Depression screen PHQ 2/9  Decreased Interest 0  Down, Depressed, Hopeless 0  PHQ - 2 Score 0        Current Medication: Outpatient Encounter Medications as of 08/19/2022  Medication Sig   albuterol (VENTOLIN HFA) 108 (90 Base) MCG/ACT inhaler INHALE 2 INHALATIONS INTO THE LUNGS EVERY 4 HOURS AS NEEDED FOR WHEEZE OR FOR SHORTNESS OF BREATH   amoxicillin-clavulanate (AUGMENTIN) 875-125 MG tablet Take 1 tablet by mouth 2 (two) times daily. Take with food.   aspirin EC 81 MG tablet Take by mouth.   carvedilol (COREG) 25 MG tablet Take 25 mg by mouth 2 (two) times daily.   colchicine 0.6 MG tablet FOR ACUTE GOUT ATTACK, TAKE 2 TABLETS NOW THEN TAKE 1 MORE TABLET 1 HOUR LATER ON THE FIRST DAY. THEN TAKE 1 TABLET DAILY UNTIL FLARE UP RESOLVES.   diltiazem (CARDIZEM CD) 240 MG 24 hr capsule Take 240 mg  by mouth daily.   esomeprazole (NEXIUM) 40 MG capsule TAKE 1 CAPSULE (40 MG TOTAL) BY MOUTH DAILY AT 12 NOON.   furosemide (LASIX) 40 MG tablet Take 1 tablet (40 mg total) by mouth daily.   KLOR-CON M10 10 MEQ tablet Take 2 tablets (20 mEq total) by mouth daily.   losartan-hydrochlorothiazide (HYZAAR) 100-12.5 MG tablet Take 1 tablet by mouth daily.   metFORMIN (GLUCOPHAGE-XR) 750 MG 24 hr tablet TAKE 1 TABLET BY MOUTH EVERY DAY WITH BREAKFAST   mirabegron ER (MYRBETRIQ) 50 MG TB24 tablet Take 1 tablet (50 mg total) by mouth daily.   simvastatin (ZOCOR) 40 MG tablet Take 40 mg by mouth daily.   tamsulosin (FLOMAX) 0.4 MG CAPS capsule TAKE 1 CAPSULE BY MOUTH EVERY DAY   [DISCONTINUED] allopurinol (ZYLOPRIM) 300 MG tablet Take 1 tablet (300 mg total) by mouth daily.   [DISCONTINUED] Zoster Vaccine Adjuvanted [facility]) injection Inject 0.5 mLs into the muscle once.   allopurinol (ZYLOPRIM) 300 MG tablet Take 1 tablet (300 mg total) by  mouth daily.   apixaban (ELIQUIS) 5 MG TABS tablet Take 1 tablet (5 mg total) by mouth 2 (two) times daily.   atorvastatin (LIPITOR) 40 MG tablet Take 1 tablet (40 mg total) by mouth daily.   Zoster Vaccine Adjuvanted [facility]) injection Inject 0.5 mLs into the muscle once for 1 dose.   No facility-administered encounter medications on file as of 08/19/2022.    Surgical History: Past Surgical History:  Procedure Laterality Date   CARDIAC CATHETERIZATION     COLONOSCOPY WITH PROPOFOL N/A 01/24/2020   Procedure: COLONOSCOPY WITH PROPOFOL;  Surgeon: Wyline Mood, MD;  Location: [facility] ENDOSCOPY;  Service: Gastroenterology;  Laterality: N/A;   CORONARY/GRAFT ACUTE MI REVASCULARIZATION N/A 05/02/2018   Procedure: Coronary/Graft Acute MI Revascularization;  Surgeon: Alwyn Pea, MD;  Location: [facility] INVASIVE CV LAB;  Service: Cardiovascular;  Laterality: N/A;   HERNIA REPAIR     LEFT HEART CATH AND CORONARY ANGIOGRAPHY N/A 05/02/2018   Procedure: LEFT HEART CATH AND CORONARY ANGIOGRAPHY;  Surgeon: Alwyn Pea, MD;  Location: [facility] INVASIVE CV LAB;  Service: Cardiovascular;  Laterality: N/A;   PACEMAKER INSERTION Left 05/03/2018   Procedure: INSERTION PACEMAKER;  Surgeon: Marcina Millard, MD;  Location: [facility] ORS;  Service: Cardiovascular;  Laterality: Left;   RIGHT/LEFT HEART CATH AND CORONARY ANGIOGRAPHY Bilateral 12/30/2021   Procedure: RIGHT/LEFT HEART CATH AND CORONARY ANGIOGRAPHY;  Surgeon: Alwyn Pea, MD;  Location: [facility] INVASIVE CV LAB;  Service: Cardiovascular;  Laterality: Bilateral;   TEMPORARY PACEMAKER N/A 05/02/2018   Procedure: TEMPORARY PACEMAKER;  Surgeon: Alwyn Pea, MD;  Location: [facility] INVASIVE CV LAB;  Service: Cardiovascular;  Laterality: N/A;   TEMPORARY PACEMAKER Right 05/03/2018   Procedure: TEMPORARY PACEMAKER REMOVAL;  Surgeon: Marcina Millard, MD;  Location: [facility] ORS;  Service: Cardiovascular;  Laterality: Right;   TONSILLECTOMY Bilateral     1992    Medical History: Past Medical History:  Diagnosis Date   Diabetes mellitus without complication    GERD (gastroesophageal reflux disease)    Hyperlipidemia    Hypertension     Family History: Family History  Problem Relation Age of Onset   Cirrhosis Father    Bone cancer Mother     Social History   Socioeconomic History   Marital status: Married    Spouse name: Not on file   Number of children: Not on file   Years of education: Not on file   Highest education level: Not on file  Occupational History  Not on file  Tobacco Use   Smoking status: Former    Types: Cigarettes   Smokeless tobacco: Never  Vaping Use   Vaping Use: Never used  Substance and Sexual Activity   Alcohol use: No   Drug use: No   Sexual activity: Not on file  Other Topics Concern   Not on file  Social History Narrative   Not on file   Social Determinants of Health   Financial Resource Strain: Not on file  Food Insecurity: Not on file  Transportation Needs: Not on file  Physical Activity: Not on file  Stress: Not on file  Social Connections: Not on file  Intimate Partner Violence: Not on file      Review of Systems  Constitutional:  Negative for activity change, appetite change, chills, fatigue, fever and unexpected weight change.  HENT: Negative.  Negative for congestion, ear pain, rhinorrhea, sore throat and trouble swallowing.   Eyes: Negative.   Respiratory: Negative.  Negative for cough, chest tightness, shortness of breath and wheezing.   Cardiovascular: Negative.  Negative for chest pain.  Gastrointestinal: Negative.  Negative for abdominal pain, blood in stool, constipation, diarrhea, nausea and vomiting.  Endocrine: Negative.   Genitourinary: Negative.  Negative for difficulty urinating, dysuria, frequency, hematuria and urgency.  Musculoskeletal: Negative.  Negative for arthralgias, back pain, joint swelling, myalgias and neck pain.  Skin: Negative.  Negative  for rash and wound.  Allergic/Immunologic: Negative.  Negative for immunocompromised state.  Neurological: Negative.  Negative for dizziness, seizures, numbness and headaches.  Hematological: Negative.   Psychiatric/Behavioral: Negative.  Negative for behavioral problems, self-injury and suicidal ideas. The patient is not nervous/anxious.     Vital Signs: BP (!) 140/75 Comment: 182/102  Pulse 84   Temp (!) 97 F (36.1 C)   Resp 16   Ht  (1.905 m)   Wt 252 lb (114.3 kg)   SpO2 99%   BMI 31.50 kg/m    Physical Exam Vitals reviewed.  Constitutional:      General: He is awake. He is not in acute distress.    Appearance: Normal appearance. He is well-developed and well-groomed. He is obese. He is not ill-appearing or diaphoretic.  HENT:     Head: Normocephalic and atraumatic.     Right Ear: Tympanic membrane, ear canal and external ear normal.     Left Ear: Tympanic membrane, ear canal and external ear normal.     Nose: Nose normal. No congestion or rhinorrhea.     Mouth/Throat:     Lips: Pink.     Mouth: Mucous membranes are moist.     Pharynx: Oropharynx is clear. Uvula midline. No oropharyngeal exudate or posterior oropharyngeal erythema.  Eyes:     General: Lids are normal. Vision grossly intact. Gaze aligned appropriately. No scleral icterus.       Right eye: No discharge.        Left eye: No discharge.     Extraocular Movements: Extraocular movements intact.     Conjunctiva/sclera: Conjunctivae normal.     Pupils: Pupils are equal, round, and reactive to light.     Funduscopic exam:    Right eye: Red reflex present.        Left eye: Red reflex present. Neck:     Thyroid: No thyromegaly.     Vascular: No JVD.     Trachea: Trachea and phonation normal. No tracheal deviation.  Cardiovascular:     Rate and Rhythm: Normal rate and regular rhythm.  Pulses:          Dorsalis pedis pulses are 2+ on the right side and 2+ on the left side.       Posterior tibial  pulses are 1+ on the right side and 1+ on the left side.     Heart sounds: Normal heart sounds, S1 normal and S2 normal. No murmur heard.    No friction rub. No gallop.  Pulmonary:     Effort: Pulmonary effort is normal. No accessory muscle usage or respiratory distress.     Breath sounds: Normal breath sounds and air entry. No stridor. No wheezing or rales.  Chest:     Chest wall: No tenderness.  Abdominal:     General: Bowel sounds are normal. There is no distension.     Palpations: Abdomen is soft. There is no shifting dullness, fluid wave, mass or pulsatile mass.     Tenderness: There is no abdominal tenderness. There is no guarding or rebound.  Musculoskeletal:        General: No tenderness or deformity. Normal range of motion.     Cervical back: Normal range of motion and neck supple.     Right lower leg: 1+ Pitting Edema present.     Left lower leg: 1+ Pitting Edema present.     Right foot: Normal range of motion. No deformity, bunion, Charcot foot, foot drop or prominent metatarsal heads.     Left foot: Normal range of motion. No deformity, bunion, Charcot foot, foot drop or prominent metatarsal heads.  Feet:     Right foot:     Protective Sensation: 6 sites tested.  6 sites sensed.     Skin integrity: Callus and dry skin present. No ulcer, blister, skin breakdown, erythema, warmth or fissure.     Toenail Condition: Right toenails are abnormally thick. Fungal disease present.    Left foot:     Protective Sensation: 6 sites tested.  6 sites sensed.     Skin integrity: Callus and dry skin present. No ulcer, blister, skin breakdown, erythema, warmth or fissure.     Toenail Condition: Left toenails are abnormally thick. Fungal disease present. Lymphadenopathy:     Cervical: No cervical adenopathy.  Skin:    General: Skin is warm and dry.     Capillary Refill: Capillary refill takes less than 2 seconds.     Coloration: Skin is not pale.     Findings: No erythema or rash.   Neurological:     Mental Status: He is alert and oriented to person, place, and time.     Cranial Nerves: No cranial nerve deficit.     Motor: No abnormal muscle tone.     Coordination: Coordination normal.     Deep Tendon Reflexes: Reflexes are normal and symmetric.  Psychiatric:        Mood and Affect: Mood normal.        Behavior: Behavior normal. Behavior is cooperative.        Thought Content: Thought content normal.        Judgment: Judgment normal.        Assessment/Plan: 1. Encounter for routine adult health examination with abnormal findings Age-appropriate preventive screenings and vaccinations discussed, annual physical exam completed. Routine labs for health maintenance ordered, see below. PHM updated.  - CBC with Differential/Platelet - CMP14+EGFR - Lipid Profile  2. Type 2 diabetes mellitus with hyperglycemia, without long-term current use of insulin A1c 7.0, improving, repeat A1c in 4 months, routine labs ordered.  Continue current medications as prescribed.  - POCT glycosylated hemoglobin (Hb A1C) - Urine Microalbumin w/creat. ratio - CBC with Differential/Platelet - CMP14+EGFR - Lipid Profile  3. Mixed hyperlipidemia Routine labs ordered.  - CBC with Differential/Platelet - CMP14+EGFR - Lipid Profile  4. 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  - allopurinol (ZYLOPRIM) 300 MG tablet; Take 1 tablet (300 mg total) by mouth daily.  Dispense: 90 tablet; Refill: 1  5. Dysuria Routine urinalysis done  - Urinalysis, Routine w reflex microscopic  6. Need for vaccination - Zoster Vaccine Adjuvanted [facility]) injection; Inject 0.5 mLs into the muscle once for 1 dose.  Dispense: 0.5 mL; Refill: 0      General Counseling: Ehsan verbalizes understanding of the findings of todays visit and agrees with plan of treatment. I have discussed any further diagnostic evaluation that may be needed or ordered  today. We also reviewed his medications today. he has been encouraged to call the office with any questions or concerns that should arise related to todays visit.    Orders Placed This Encounter  Procedures   Urinalysis, Routine w reflex microscopic   CBC with Differential/Platelet   CMP14+EGFR   Lipid Profile   Urine Microalbumin w/creat. ratio   POCT glycosylated hemoglobin (Hb A1C)    Meds ordered this encounter  Medications   Zoster Vaccine Adjuvanted [facility]) injection    Sig: Inject 0.5 mLs into the muscle once for 1 dose.    Dispense:  0.5 mL    Refill:  0   allopurinol (ZYLOPRIM) 300 MG tablet    Sig: Take 1 tablet (300 mg total) by mouth daily.    Dispense:  90 tablet    Refill:  1    Return in about 4 months (around 12/19/2022) for F/U, Lloyd Ayo PCP, Recheck A1C.   Total time spent:30 Minutes Time spent includes review of chart, medications, test results, and follow up plan with the patient.   [place] Controlled Substance Database was reviewed by me.  This patient was seen by Sallyanne Kuster, FNP-C in collaboration with Dr. Beverely Risen as a part of collaborative care agreement.  Angelique Chevalier R. Tedd Sias, MSN, FNP-C Internal medicine

## 2022-08-20 ENCOUNTER — Telehealth: Payer: Self-pay

## 2022-08-20 LAB — MICROALBUMIN / CREATININE URINE RATIO

## 2022-08-20 NOTE — Telephone Encounter (Signed)
Pt daughter  advised that we are unable get report through [facility] retinal screening due to unable to get images and pt need appt with Eye dr ASAP and also spoke with he said he already setup appt for ophthalmology

## 2022-08-21 LAB — URINALYSIS, ROUTINE W REFLEX MICROSCOPIC
Bilirubin, UA: NEGATIVE
Glucose, UA: NEGATIVE
Ketones, UA: NEGATIVE
Leukocytes,UA: NEGATIVE
Nitrite, UA: NEGATIVE
Protein,UA: NEGATIVE
RBC, UA: NEGATIVE
Specific Gravity, UA: 1.007 (ref 1.005–1.030)
Urobilinogen, Ur: 0.2 mg/dL (ref 0.2–1.0)
pH, UA: 7 (ref 5.0–7.5)

## 2022-08-21 LAB — MICROALBUMIN / CREATININE URINE RATIO: Creatinine, Urine: 12.6 mg/dL

## 2022-08-25 DIAGNOSIS — H2513 Age-related nuclear cataract, bilateral: Secondary | ICD-10-CM | POA: Diagnosis not present

## 2022-08-25 DIAGNOSIS — H401132 Primary open-angle glaucoma, bilateral, moderate stage: Secondary | ICD-10-CM | POA: Diagnosis not present

## 2022-08-25 DIAGNOSIS — Z0100 Encounter for examination of eyes and vision without abnormal findings: Secondary | ICD-10-CM | POA: Diagnosis not present

## 2022-08-25 DIAGNOSIS — E119 Type 2 diabetes mellitus without complications: Secondary | ICD-10-CM | POA: Diagnosis not present

## 2022-08-25 DIAGNOSIS — H2512 Age-related nuclear cataract, left eye: Secondary | ICD-10-CM | POA: Diagnosis not present

## 2022-08-25 DIAGNOSIS — H2511 Age-related nuclear cataract, right eye: Secondary | ICD-10-CM | POA: Diagnosis not present

## 2022-09-10 ENCOUNTER — Other Ambulatory Visit: Payer: Self-pay | Admitting: Nurse Practitioner

## 2022-09-10 DIAGNOSIS — M1A0210 Idiopathic chronic gout, right elbow, without tophus (tophi): Secondary | ICD-10-CM

## 2022-09-11 DIAGNOSIS — Z0100 Encounter for examination of eyes and vision without abnormal findings: Secondary | ICD-10-CM | POA: Diagnosis not present

## 2022-09-11 DIAGNOSIS — H401132 Primary open-angle glaucoma, bilateral, moderate stage: Secondary | ICD-10-CM | POA: Diagnosis not present

## 2022-09-15 ENCOUNTER — Telehealth: Payer: Self-pay

## 2022-09-15 ENCOUNTER — Other Ambulatory Visit: Payer: Self-pay | Admitting: Nurse Practitioner

## 2022-09-15 MED ORDER — AZITHROMYCIN 250 MG PO TABS: ORAL_TABLET | ORAL | 0 refills | Status: AC

## 2022-09-15 NOTE — Telephone Encounter (Signed)
Alyssa sent z-pack to patient's pharmacy, notified daughter.

## 2022-09-16 ENCOUNTER — Telehealth: Payer: Self-pay

## 2022-09-16 NOTE — Telephone Encounter (Signed)
Patient's daughter to report nose bleeds. Per Alyssa, patient should use Afrin nasal spray and a cold compress.

## 2022-09-25 DIAGNOSIS — Z0100 Encounter for examination of eyes and vision without abnormal findings: Secondary | ICD-10-CM | POA: Diagnosis not present

## 2022-09-25 DIAGNOSIS — H401132 Primary open-angle glaucoma, bilateral, moderate stage: Secondary | ICD-10-CM | POA: Diagnosis not present

## 2022-10-03 ENCOUNTER — Telehealth (INDEPENDENT_AMBULATORY_CARE_PROVIDER_SITE_OTHER): Payer: Medicare HMO | Admitting: Nurse Practitioner

## 2022-10-03 ENCOUNTER — Encounter: Payer: Self-pay | Admitting: Nurse Practitioner

## 2022-10-03 VITALS — Resp 16 | Ht 75.0 in | Wt 248.0 lb

## 2022-10-03 DIAGNOSIS — B9689 Other specified bacterial agents as the cause of diseases classified elsewhere: Secondary | ICD-10-CM | POA: Diagnosis not present

## 2022-10-03 DIAGNOSIS — J028 Acute pharyngitis due to other specified organisms: Secondary | ICD-10-CM | POA: Diagnosis not present

## 2022-10-03 MED ORDER — AMOXICILLIN-POT CLAVULANATE 875-125 MG PO TABS
1.0000 | ORAL_TABLET | Freq: Two times a day (BID) | ORAL | 0 refills | Status: AC
Start: 2022-10-03 — End: 2022-10-13

## 2022-10-03 NOTE — Progress Notes (Signed)
[facility] [address] [place], [place] [address]  Internal MEDICINE  Telephone Visit  Patient Name: [name]  [id number]  [id number]  Date of Service: 10/03/2022  I connected with the patient at 1030 by telephone and verified the patients identity using two identifiers.   I discussed the limitations, risks, security and privacy concerns of performing an evaluation and management service by telephone and the availability of in person appointments. I also discussed with the patient that there may be a patient responsible charge related to the service.  The patient expressed understanding and agrees to proceed.    Chief Complaint  Patient presents with   Telephone Screen    Sore throat, ear pain   Telephone Assessment    HPI [name] presents for a telehealth virtual visit for sore throat and ear pain Symptoms started several days ago Reports ears stopped up, nasal congestion, runny nose, and sore throat Denies sinus pressure, cough, headache and fatigue   Current Medication: Outpatient Encounter Medications as of 10/03/2022  Medication Sig   albuterol (VENTOLIN HFA) 108 (90 Base) MCG/ACT inhaler INHALE 2 INHALATIONS INTO THE LUNGS EVERY 4 HOURS AS NEEDED FOR WHEEZE OR FOR SHORTNESS OF BREATH   allopurinol (ZYLOPRIM) 300 MG tablet TAKE 1 TABLET BY MOUTH EVERY DAY   amoxicillin-clavulanate (AUGMENTIN) 875-125 MG tablet Take 1 tablet by mouth 2 (two) times daily for 10 days. Take with food   aspirin EC 81 MG tablet Take by mouth.   carvedilol (COREG) 25 MG tablet Take 25 mg by mouth 2 (two) times daily.   colchicine 0.6 MG tablet 2 TABS NOW THEN 1 MORE TAB 1 HOUR LATER ON FIRST DAY. THEN TAKE 1 TAB DAILY UNTIL FLARE UP RESOLVES   diltiazem (CARDIZEM CD) 240 MG 24 hr capsule Take 240 mg by mouth daily.   esomeprazole (NEXIUM) 40 MG capsule TAKE 1 CAPSULE (40 MG TOTAL) BY MOUTH DAILY AT 12 NOON.   furosemide (LASIX) 40 MG tablet Take 1 tablet (40 mg total) by mouth  daily.   KLOR-CON M10 10 MEQ tablet Take 2 tablets (20 mEq total) by mouth daily.   losartan-hydrochlorothiazide (HYZAAR) 100-12.5 MG tablet Take 1 tablet by mouth daily.   metFORMIN (GLUCOPHAGE-XR) 750 MG 24 hr tablet TAKE 1 TABLET BY MOUTH EVERY DAY WITH BREAKFAST   mirabegron ER (MYRBETRIQ) 50 MG TB24 tablet Take 1 tablet (50 mg total) by mouth daily.   simvastatin (ZOCOR) 40 MG tablet Take 40 mg by mouth daily.   tamsulosin (FLOMAX) 0.4 MG CAPS capsule TAKE 1 CAPSULE BY MOUTH EVERY DAY   [DISCONTINUED] amoxicillin-clavulanate (AUGMENTIN) 875-125 MG tablet Take 1 tablet by mouth 2 (two) times daily. Take with food.   apixaban (ELIQUIS) 5 MG TABS tablet Take 1 tablet (5 mg total) by mouth 2 (two) times daily.   atorvastatin (LIPITOR) 40 MG tablet Take 1 tablet (40 mg total) by mouth daily.   No facility-administered encounter medications on file as of 10/03/2022.    Surgical History: Past Surgical History:  Procedure Laterality Date   CARDIAC CATHETERIZATION     COLONOSCOPY WITH PROPOFOL N/A 01/24/2020   Procedure: COLONOSCOPY WITH PROPOFOL;  Surgeon: Wyline Mood, MD;  Location: [facility] ENDOSCOPY;  Service: Gastroenterology;  Laterality: N/A;   CORONARY/GRAFT ACUTE MI REVASCULARIZATION N/A 05/02/2018   Procedure: Coronary/Graft Acute MI Revascularization;  Surgeon: Alwyn Pea, MD;  Location: [facility] INVASIVE CV LAB;  Service: Cardiovascular;  Laterality: N/A;   HERNIA REPAIR     LEFT HEART  CATH AND CORONARY ANGIOGRAPHY N/A 05/02/2018   Procedure: LEFT HEART CATH AND CORONARY ANGIOGRAPHY;  Surgeon: Alwyn Pea, MD;  Location: [facility] INVASIVE CV LAB;  Service: Cardiovascular;  Laterality: N/A;   PACEMAKER INSERTION Left 05/03/2018   Procedure: INSERTION PACEMAKER;  Surgeon: Marcina Millard, MD;  Location: [facility] ORS;  Service: Cardiovascular;  Laterality: Left;   RIGHT/LEFT HEART CATH AND CORONARY ANGIOGRAPHY Bilateral 12/30/2021   Procedure: RIGHT/LEFT HEART CATH AND CORONARY  ANGIOGRAPHY;  Surgeon: Alwyn Pea, MD;  Location: [facility] INVASIVE CV LAB;  Service: Cardiovascular;  Laterality: Bilateral;   TEMPORARY PACEMAKER N/A 05/02/2018   Procedure: TEMPORARY PACEMAKER;  Surgeon: Alwyn Pea, MD;  Location: [facility] INVASIVE CV LAB;  Service: Cardiovascular;  Laterality: N/A;   TEMPORARY PACEMAKER Right 05/03/2018   Procedure: TEMPORARY PACEMAKER REMOVAL;  Surgeon: Marcina Millard, MD;  Location: [facility] ORS;  Service: Cardiovascular;  Laterality: Right;   TONSILLECTOMY Bilateral    1992    Medical History: Past Medical History:  Diagnosis Date   Diabetes mellitus without complication (HCC)    GERD (gastroesophageal reflux disease)    Hyperlipidemia    Hypertension     Family History: Family History  Problem Relation Age of Onset   Cirrhosis Father    Bone cancer Mother     Social History   Socioeconomic History   Marital status: Married    Spouse name: Not on file   Number of children: Not on file   Years of education: Not on file   Highest education level: Not on file  Occupational History   Not on file  Tobacco Use   Smoking status: Former    Types: Cigarettes   Smokeless tobacco: Never  Vaping Use   Vaping Use: Never used  Substance and Sexual Activity   Alcohol use: No   Drug use: No   Sexual activity: Not on file  Other Topics Concern   Not on file  Social History Narrative   Not on file   Social Determinants of Health   Financial Resource Strain: Not on file  Food Insecurity: Not on file  Transportation Needs: Not on file  Physical Activity: Not on file  Stress: Not on file  Social Connections: Not on file  Intimate Partner Violence: Not on file      Review of Systems  Constitutional:  Negative for chills, fatigue and fever.  HENT:  Positive for congestion, ear pain, postnasal drip, rhinorrhea, sneezing and sore throat. Negative for sinus pressure and sinus pain.   Respiratory:  Negative for cough, chest  tightness, shortness of breath and wheezing.   Cardiovascular: Negative.  Negative for chest pain and palpitations.  Gastrointestinal: Negative.  Negative for constipation, diarrhea, nausea and vomiting.  Musculoskeletal:  Negative for myalgias.  Neurological:  Negative for headaches.    Vital Signs: Resp 16   Ht 6\' 3"  (1.905 m)   Wt 248 lb (112.5 kg)   BMI 31.00 kg/m    Observation/Objective: He is alert and oriented and engages in conversation appropriately. No acute distress noted    Assessment/Plan: 1. Acute bacterial pharyngitis Empiric antibiotic treatment prescribed and discussed OTC medication that can help alleviate throat pain. - amoxicillin-clavulanate (AUGMENTIN) 875-125 MG tablet; Take 1 tablet by mouth 2 (two) times daily for 10 days. Take with food  Dispense: 20 tablet; Refill: 0   General Counseling: Torreon verbalizes understanding of the findings of today's phone visit and agrees with plan of treatment. I have discussed any further diagnostic evaluation that may  be needed or ordered today. We also reviewed his medications today. he has been encouraged to call the office with any questions or concerns that should arise related to todays visit.  Return if symptoms worsen or fail to improve.   No orders of the defined types were placed in this encounter.   Meds ordered this encounter  Medications   amoxicillin-clavulanate (AUGMENTIN) 875-125 MG tablet    Sig: Take 1 tablet by mouth 2 (two) times daily for 10 days. Take with food    Dispense:  20 tablet    Refill:  0    Fill now please    Time spent:10 Minutes Time spent with patient included reviewing progress notes, labs, imaging studies, and discussing plan for follow up.  [place] Controlled Substance Database was reviewed by me for overdose risk score (ORS) if appropriate.  This patient was seen by Sallyanne Kuster, FNP-C in collaboration with Dr. Beverely Risen as a part of collaborative care  agreement.  Garreth Burnsworth R. Tedd Sias, MSN, FNP-C Internal medicine

## 2022-10-08 ENCOUNTER — Other Ambulatory Visit: Payer: Self-pay | Admitting: Internal Medicine

## 2022-10-08 ENCOUNTER — Other Ambulatory Visit: Payer: Self-pay | Admitting: Nurse Practitioner

## 2022-10-08 DIAGNOSIS — N400 Enlarged prostate without lower urinary tract symptoms: Secondary | ICD-10-CM

## 2022-10-09 DIAGNOSIS — E119 Type 2 diabetes mellitus without complications: Secondary | ICD-10-CM | POA: Diagnosis not present

## 2022-10-09 DIAGNOSIS — H401132 Primary open-angle glaucoma, bilateral, moderate stage: Secondary | ICD-10-CM | POA: Diagnosis not present

## 2022-11-22 ENCOUNTER — Other Ambulatory Visit: Payer: Self-pay | Admitting: Nurse Practitioner

## 2022-11-22 DIAGNOSIS — E1165 Type 2 diabetes mellitus with hyperglycemia: Secondary | ICD-10-CM

## 2022-11-24 DIAGNOSIS — M9903 Segmental and somatic dysfunction of lumbar region: Secondary | ICD-10-CM | POA: Diagnosis not present

## 2022-11-24 DIAGNOSIS — M5136 Other intervertebral disc degeneration, lumbar region: Secondary | ICD-10-CM | POA: Diagnosis not present

## 2022-11-24 DIAGNOSIS — M5451 Vertebrogenic low back pain: Secondary | ICD-10-CM | POA: Diagnosis not present

## 2022-11-24 DIAGNOSIS — M7918 Myalgia, other site: Secondary | ICD-10-CM | POA: Diagnosis not present

## 2022-11-24 DIAGNOSIS — M5137 Other intervertebral disc degeneration, lumbosacral region: Secondary | ICD-10-CM | POA: Diagnosis not present

## 2022-11-24 DIAGNOSIS — M62830 Muscle spasm of back: Secondary | ICD-10-CM | POA: Diagnosis not present

## 2022-11-24 DIAGNOSIS — M9904 Segmental and somatic dysfunction of sacral region: Secondary | ICD-10-CM | POA: Diagnosis not present

## 2022-11-25 DIAGNOSIS — M62830 Muscle spasm of back: Secondary | ICD-10-CM | POA: Diagnosis not present

## 2022-11-25 DIAGNOSIS — M9904 Segmental and somatic dysfunction of sacral region: Secondary | ICD-10-CM | POA: Diagnosis not present

## 2022-11-25 DIAGNOSIS — M5136 Other intervertebral disc degeneration, lumbar region: Secondary | ICD-10-CM | POA: Diagnosis not present

## 2022-11-25 DIAGNOSIS — M5137 Other intervertebral disc degeneration, lumbosacral region: Secondary | ICD-10-CM | POA: Diagnosis not present

## 2022-11-25 DIAGNOSIS — M5451 Vertebrogenic low back pain: Secondary | ICD-10-CM | POA: Diagnosis not present

## 2022-11-25 DIAGNOSIS — M9903 Segmental and somatic dysfunction of lumbar region: Secondary | ICD-10-CM | POA: Diagnosis not present

## 2022-11-25 DIAGNOSIS — M7918 Myalgia, other site: Secondary | ICD-10-CM | POA: Diagnosis not present

## 2022-11-26 DIAGNOSIS — M7918 Myalgia, other site: Secondary | ICD-10-CM | POA: Diagnosis not present

## 2022-11-26 DIAGNOSIS — M62830 Muscle spasm of back: Secondary | ICD-10-CM | POA: Diagnosis not present

## 2022-11-26 DIAGNOSIS — M5137 Other intervertebral disc degeneration, lumbosacral region: Secondary | ICD-10-CM | POA: Diagnosis not present

## 2022-11-26 DIAGNOSIS — M5136 Other intervertebral disc degeneration, lumbar region: Secondary | ICD-10-CM | POA: Diagnosis not present

## 2022-11-26 DIAGNOSIS — M9903 Segmental and somatic dysfunction of lumbar region: Secondary | ICD-10-CM | POA: Diagnosis not present

## 2022-11-26 DIAGNOSIS — M5451 Vertebrogenic low back pain: Secondary | ICD-10-CM | POA: Diagnosis not present

## 2022-11-26 DIAGNOSIS — M9904 Segmental and somatic dysfunction of sacral region: Secondary | ICD-10-CM | POA: Diagnosis not present

## 2022-11-28 DIAGNOSIS — M9903 Segmental and somatic dysfunction of lumbar region: Secondary | ICD-10-CM | POA: Diagnosis not present

## 2022-11-28 DIAGNOSIS — M5451 Vertebrogenic low back pain: Secondary | ICD-10-CM | POA: Diagnosis not present

## 2022-11-28 DIAGNOSIS — M5137 Other intervertebral disc degeneration, lumbosacral region: Secondary | ICD-10-CM | POA: Diagnosis not present

## 2022-11-28 DIAGNOSIS — M9904 Segmental and somatic dysfunction of sacral region: Secondary | ICD-10-CM | POA: Diagnosis not present

## 2022-11-28 DIAGNOSIS — M62830 Muscle spasm of back: Secondary | ICD-10-CM | POA: Diagnosis not present

## 2022-11-28 DIAGNOSIS — M5136 Other intervertebral disc degeneration, lumbar region: Secondary | ICD-10-CM | POA: Diagnosis not present

## 2022-11-28 DIAGNOSIS — M7918 Myalgia, other site: Secondary | ICD-10-CM | POA: Diagnosis not present

## 2022-12-10 ENCOUNTER — Telehealth: Payer: Self-pay

## 2022-12-11 ENCOUNTER — Other Ambulatory Visit: Payer: Self-pay

## 2022-12-11 MED ORDER — AZITHROMYCIN 250 MG PO TABS: ORAL_TABLET | ORAL | 0 refills | Status: DC

## 2022-12-11 NOTE — Telephone Encounter (Signed)
Spoke to [name] to send a zpak in, Pt notified that medicine was send in.

## 2022-12-16 DIAGNOSIS — I495 Sick sinus syndrome: Secondary | ICD-10-CM | POA: Diagnosis not present

## 2022-12-18 ENCOUNTER — Ambulatory Visit: Payer: Medicare HMO | Admitting: Nurse Practitioner

## 2022-12-22 ENCOUNTER — Ambulatory Visit (INDEPENDENT_AMBULATORY_CARE_PROVIDER_SITE_OTHER): Payer: Medicare HMO | Admitting: Nurse Practitioner

## 2022-12-22 ENCOUNTER — Encounter: Payer: Self-pay | Admitting: Nurse Practitioner

## 2022-12-22 VITALS — BP 115/65 | HR 79 | Temp 98.2°F | Resp 16 | Ht 75.0 in | Wt 256.0 lb

## 2022-12-22 DIAGNOSIS — E782 Mixed hyperlipidemia: Secondary | ICD-10-CM | POA: Diagnosis not present

## 2022-12-22 DIAGNOSIS — J028 Acute pharyngitis due to other specified organisms: Secondary | ICD-10-CM | POA: Diagnosis not present

## 2022-12-22 DIAGNOSIS — B9689 Other specified bacterial agents as the cause of diseases classified elsewhere: Secondary | ICD-10-CM

## 2022-12-22 DIAGNOSIS — Z79899 Other long term (current) drug therapy: Secondary | ICD-10-CM | POA: Diagnosis not present

## 2022-12-22 DIAGNOSIS — M1A0210 Idiopathic chronic gout, right elbow, without tophus (tophi): Secondary | ICD-10-CM

## 2022-12-22 DIAGNOSIS — I10 Essential (primary) hypertension: Secondary | ICD-10-CM | POA: Diagnosis not present

## 2022-12-22 DIAGNOSIS — E1165 Type 2 diabetes mellitus with hyperglycemia: Secondary | ICD-10-CM | POA: Diagnosis not present

## 2022-12-22 DIAGNOSIS — K219 Gastro-esophageal reflux disease without esophagitis: Secondary | ICD-10-CM

## 2022-12-22 LAB — POCT GLYCOSYLATED HEMOGLOBIN (HGB A1C): Hemoglobin A1C: 7.2 % — AB (ref 4.0–5.6)

## 2022-12-22 MED ORDER — METFORMIN HCL ER 750 MG PO TB24: 1500.0000 mg | ORAL_TABLET | Freq: Every day | ORAL | 1 refills | Status: DC

## 2022-12-22 MED ORDER — KLOR-CON M10 10 MEQ PO TBCR
20.0000 meq | EXTENDED_RELEASE_TABLET | Freq: Every day | ORAL | 3 refills | Status: DC
Start: 2022-12-22 — End: 2023-03-09

## 2022-12-22 MED ORDER — ALBUTEROL SULFATE HFA 108 (90 BASE) MCG/ACT IN AERS: 1.0000 | INHALATION_SPRAY | RESPIRATORY_TRACT | 5 refills | Status: DC | PRN

## 2022-12-22 MED ORDER — ALLOPURINOL 300 MG PO TABS
300.0000 mg | ORAL_TABLET | Freq: Every day | ORAL | 1 refills | Status: DC
Start: 2022-12-22 — End: 2023-06-22

## 2022-12-22 MED ORDER — LOSARTAN POTASSIUM-HCTZ 100-12.5 MG PO TABS
1.0000 | ORAL_TABLET | Freq: Every day | ORAL | 3 refills | Status: DC
Start: 2022-12-22 — End: 2023-03-09

## 2022-12-22 MED ORDER — FUROSEMIDE 40 MG PO TABS
40.0000 mg | ORAL_TABLET | Freq: Every day | ORAL | 3 refills | Status: AC
Start: 2022-12-22 — End: ?

## 2022-12-22 MED ORDER — ESOMEPRAZOLE MAGNESIUM 40 MG PO CPDR
40.0000 mg | DELAYED_RELEASE_CAPSULE | Freq: Every day | ORAL | 1 refills | Status: DC
Start: 2022-12-22 — End: 2023-06-23

## 2022-12-22 MED ORDER — ATORVASTATIN CALCIUM 40 MG PO TABS: 40.0000 mg | ORAL_TABLET | Freq: Every day | ORAL | 1 refills | Status: DC

## 2022-12-22 MED ORDER — DOXYCYCLINE HYCLATE 100 MG PO TABS
100.0000 mg | ORAL_TABLET | Freq: Two times a day (BID) | ORAL | 0 refills | Status: AC
Start: 2022-12-22 — End: 2022-12-29

## 2022-12-22 NOTE — Progress Notes (Signed)
[facility] [address] [place], [place] [address]  Internal MEDICINE  Office Visit Note  Patient Name: [name]  [id number]  [id number]  Date of Service: 12/22/2022  Chief Complaint  Patient presents with   Follow-up   Diabetes   Gastroesophageal Reflux   Hypertension   Hyperlipidemia    HPI [name] presents for a follow-up visit for diabetes, hypertension, and sinusitis Diabetes-- A1c slightly increased to 7.2, takes metformin XR 750 mg Hypertension -- controlled with current medications Chest congestion, sinus pressure -- took zpak recently but does not feel 100% better. Still having sore throat as well. Hyperlipidemia -- takes atorvastatin Also due for several other medication refills.     Current Medication: Outpatient Encounter Medications as of 12/22/2022  Medication Sig   aspirin EC 81 MG tablet Take by mouth.   carvedilol (COREG) 25 MG tablet Take 25 mg by mouth 2 (two) times daily.   colchicine 0.6 MG tablet 2 TABS NOW THEN 1 MORE TAB 1 HOUR LATER ON FIRST DAY. THEN TAKE 1 TAB DAILY UNTIL FLARE UP RESOLVES   diltiazem (CARDIZEM CD) 240 MG 24 hr capsule Take 240 mg by mouth daily.   doxycycline (VIBRA-TABS) 100 MG tablet Take 1 tablet (100 mg total) by mouth 2 (two) times daily for 7 days. Take with food   mirabegron ER (MYRBETRIQ) 50 MG TB24 tablet Take 1 tablet (50 mg total) by mouth daily.   tamsulosin (FLOMAX) 0.4 MG CAPS capsule TAKE 1 CAPSULE BY MOUTH EVERY DAY   [DISCONTINUED] albuterol (VENTOLIN HFA) 108 (90 Base) MCG/ACT inhaler INHALE 2 INHALATIONS INTO THE LUNGS EVERY 4 HOURS AS NEEDED FOR WHEEZE OR FOR SHORTNESS OF BREATH   [DISCONTINUED] allopurinol (ZYLOPRIM) 300 MG tablet TAKE 1 TABLET BY MOUTH EVERY DAY   [DISCONTINUED] azithromycin (ZITHROMAX) 250 MG tablet Use as direct for 5 days.   [DISCONTINUED] esomeprazole (NEXIUM) 40 MG capsule TAKE 1 CAPSULE (40 MG TOTAL) BY MOUTH DAILY AT 12 NOON.   [DISCONTINUED] furosemide (LASIX) 40  MG tablet Take 1 tablet (40 mg total) by mouth daily.   [DISCONTINUED] KLOR-CON M10 10 MEQ tablet TAKE 2 TABLETS BY MOUTH DAILY   [DISCONTINUED] losartan-hydrochlorothiazide (HYZAAR) 100-12.5 MG tablet Take 1 tablet by mouth daily.   [DISCONTINUED] metFORMIN (GLUCOPHAGE-XR) 750 MG 24 hr tablet TAKE 1 TABLET BY MOUTH EVERY DAY WITH BREAKFAST   [DISCONTINUED] simvastatin (ZOCOR) 40 MG tablet Take 40 mg by mouth daily.   albuterol (VENTOLIN HFA) 108 (90 Base) MCG/ACT inhaler Inhale 1-2 puffs into the lungs every 4 (four) hours as needed for wheezing or shortness of breath. INHALE 2 INHALATIONS INTO THE LUNGS EVERY 4 HOURS AS NEEDED FOR WHEEZE OR FOR SHORTNESS OF BREATH   allopurinol (ZYLOPRIM) 300 MG tablet Take 1 tablet (300 mg total) by mouth daily.   apixaban (ELIQUIS) 5 MG TABS tablet Take 1 tablet (5 mg total) by mouth 2 (two) times daily.   atorvastatin (LIPITOR) 40 MG tablet Take 1 tablet (40 mg total) by mouth daily.   esomeprazole (NEXIUM) 40 MG capsule Take 1 capsule (40 mg total) by mouth daily at 12 noon.   furosemide (LASIX) 40 MG tablet Take 1 tablet (40 mg total) by mouth daily.   KLOR-CON M10 10 MEQ tablet Take 2 tablets (20 mEq total) by mouth daily.   losartan-hydrochlorothiazide (HYZAAR) 100-12.5 MG tablet Take 1 tablet by mouth daily.   metFORMIN (GLUCOPHAGE-XR) 750 MG 24 hr tablet Take 2 tablets (1,500 mg total) by mouth daily with  breakfast.   [DISCONTINUED] atorvastatin (LIPITOR) 40 MG tablet Take 1 tablet (40 mg total) by mouth daily.   No facility-administered encounter medications on file as of 12/22/2022.    Surgical History: Past Surgical History:  Procedure Laterality Date   CARDIAC CATHETERIZATION     COLONOSCOPY WITH PROPOFOL N/A 01/24/2020   Procedure: COLONOSCOPY WITH PROPOFOL;  Surgeon: Wyline Mood, MD;  Location: [facility] ENDOSCOPY;  Service: Gastroenterology;  Laterality: N/A;   CORONARY/GRAFT ACUTE MI REVASCULARIZATION N/A 05/02/2018   Procedure: Coronary/Graft  Acute MI Revascularization;  Surgeon: Alwyn Pea, MD;  Location: [facility] INVASIVE CV LAB;  Service: Cardiovascular;  Laterality: N/A;   HERNIA REPAIR     LEFT HEART CATH AND CORONARY ANGIOGRAPHY N/A 05/02/2018   Procedure: LEFT HEART CATH AND CORONARY ANGIOGRAPHY;  Surgeon: Alwyn Pea, MD;  Location: [facility] INVASIVE CV LAB;  Service: Cardiovascular;  Laterality: N/A;   PACEMAKER INSERTION Left 05/03/2018   Procedure: INSERTION PACEMAKER;  Surgeon: Marcina Millard, MD;  Location: [facility] ORS;  Service: Cardiovascular;  Laterality: Left;   RIGHT/LEFT HEART CATH AND CORONARY ANGIOGRAPHY Bilateral 12/30/2021   Procedure: RIGHT/LEFT HEART CATH AND CORONARY ANGIOGRAPHY;  Surgeon: Alwyn Pea, MD;  Location: [facility] INVASIVE CV LAB;  Service: Cardiovascular;  Laterality: Bilateral;   TEMPORARY PACEMAKER N/A 05/02/2018   Procedure: TEMPORARY PACEMAKER;  Surgeon: Alwyn Pea, MD;  Location: [facility] INVASIVE CV LAB;  Service: Cardiovascular;  Laterality: N/A;   TEMPORARY PACEMAKER Right 05/03/2018   Procedure: TEMPORARY PACEMAKER REMOVAL;  Surgeon: Marcina Millard, MD;  Location: [facility] ORS;  Service: Cardiovascular;  Laterality: Right;   TONSILLECTOMY Bilateral    1992    Medical History: Past Medical History:  Diagnosis Date   Diabetes mellitus without complication (HCC)    GERD (gastroesophageal reflux disease)    Hyperlipidemia    Hypertension     Family History: Family History  Problem Relation Age of Onset   Cirrhosis Father    Bone cancer Mother     Social History   Socioeconomic History   Marital status: Married    Spouse name: Not on file   Number of children: Not on file   Years of education: Not on file   Highest education level: Not on file  Occupational History   Not on file  Tobacco Use   Smoking status: Former    Types: Cigarettes   Smokeless tobacco: Never  Vaping Use   Vaping status: Never Used  Substance and Sexual Activity   Alcohol  use: No   Drug use: No   Sexual activity: Not on file  Other Topics Concern   Not on file  Social History Narrative   Not on file   Social Determinants of Health   Financial Resource Strain: Not on file  Food Insecurity: Not on file  Transportation Needs: Not on file  Physical Activity: Not on file  Stress: Not on file  Social Connections: Not on file  Intimate Partner Violence: Not on file      Review of Systems  Constitutional:  Negative for chills, fatigue and fever.  HENT:  Positive for congestion, ear pain, postnasal drip, rhinorrhea, sneezing and sore throat. Negative for sinus pressure and sinus pain.   Respiratory:  Negative for cough, chest tightness, shortness of breath and wheezing.   Cardiovascular: Negative.  Negative for chest pain and palpitations.  Gastrointestinal: Negative.  Negative for constipation, diarrhea, nausea and vomiting.  Musculoskeletal:  Negative for myalgias.  Neurological:  Negative for headaches.    Vital  Signs: BP 115/65 Comment: 140/82  Pulse 79   Temp 98.2 F (36.8 C)   Resp 16   Ht 6\' 3"  (1.905 m)   Wt 256 lb (116.1 kg)   SpO2 92%   BMI 32.00 kg/m    Physical Exam Vitals reviewed.  Constitutional:      General: He is not in acute distress.    Appearance: Normal appearance. He is obese. He is not ill-appearing.  HENT:     Head: Normocephalic and atraumatic.     Right Ear: Tympanic membrane, ear canal and external ear normal.     Left Ear: Tympanic membrane, ear canal and external ear normal.     Nose: Congestion present. No rhinorrhea.     Mouth/Throat:     Mouth: Mucous membranes are moist.     Pharynx: Posterior oropharyngeal erythema present.  Eyes:     Pupils: Pupils are equal, round, and reactive to light.  Cardiovascular:     Rate and Rhythm: Normal rate and regular rhythm.  Pulmonary:     Effort: Pulmonary effort is normal. No respiratory distress.  Neurological:     Mental Status: He is alert and oriented to  person, place, and time.     Cranial Nerves: No cranial nerve deficit.     Coordination: Coordination normal.     Gait: Gait normal.  Psychiatric:        Mood and Affect: Mood normal.        Behavior: Behavior normal.        Assessment/Plan: 1. Type 2 diabetes mellitus with hyperglycemia, without long-term current use of insulin (HCC) A1c is elevated, metformin dose increased. Follow up in 3 months for repeat A1c. - POCT HgB A1C - metFORMIN (GLUCOPHAGE-XR) 750 MG 24 hr tablet; Take 2 tablets (1,500 mg total) by mouth daily with breakfast.  Dispense: 180 tablet; Refill: 1  2. Essential hypertension Stable, continue current medications as prescribed, refills ordered  - furosemide (LASIX) 40 MG tablet; Take 1 tablet (40 mg total) by mouth daily.  Dispense: 30 tablet; Refill: 3 - losartan-hydrochlorothiazide (HYZAAR) 100-12.5 MG tablet; Take 1 tablet by mouth daily.  Dispense: 90 tablet; Refill: 3  3. Acute bacterial pharyngitis Doxycycline prescribed for 1 more course of antibiotics.  - doxycycline (VIBRA-TABS) 100 MG tablet; Take 1 tablet (100 mg total) by mouth 2 (two) times daily for 7 days. Take with food  Dispense: 14 tablet; Refill: 0  4. Mixed hyperlipidemia Continue atorvastatin as prescribed, refills ordered  - atorvastatin (LIPITOR) 40 MG tablet; Take 1 tablet (40 mg total) by mouth daily.  Dispense: 90 tablet; Refill: 1  5. Encounter for medication review Medication list reviewed, updated, and refills ordered - esomeprazole (NEXIUM) 40 MG capsule; Take 1 capsule (40 mg total) by mouth daily at 12 noon.  Dispense: 90 capsule; Refill: 1 - albuterol (VENTOLIN HFA) 108 (90 Base) MCG/ACT inhaler; Inhale 1-2 puffs into the lungs every 4 (four) hours as needed for wheezing or shortness of breath. INHALE 2 INHALATIONS INTO THE LUNGS EVERY 4 HOURS AS NEEDED FOR WHEEZE OR FOR SHORTNESS OF BREATH  Dispense: 8 g; Refill: 5 - allopurinol (ZYLOPRIM) 300 MG tablet; Take 1 tablet (300 mg  total) by mouth daily.  Dispense: 90 tablet; Refill: 1 - KLOR-CON M10 10 MEQ tablet; Take 2 tablets (20 mEq total) by mouth daily.  Dispense: 180 tablet; Refill: 3   General Counseling: [name] verbalizes understanding of the findings of todays visit and agrees with plan of treatment. I  have discussed any further diagnostic evaluation that may be needed or ordered today. We also reviewed his medications today. he has been encouraged to call the office with any questions or concerns that should arise related to todays visit.    Orders Placed This Encounter  Procedures   POCT HgB A1C    Meds ordered this encounter  Medications   metFORMIN (GLUCOPHAGE-XR) 750 MG 24 hr tablet    Sig: Take 2 tablets (1,500 mg total) by mouth daily with breakfast.    Dispense:  180 tablet    Refill:  1   doxycycline (VIBRA-TABS) 100 MG tablet    Sig: Take 1 tablet (100 mg total) by mouth 2 (two) times daily for 7 days. Take with food    Dispense:  14 tablet    Refill:  0   furosemide (LASIX) 40 MG tablet    Sig: Take 1 tablet (40 mg total) by mouth daily.    Dispense:  30 tablet    Refill:  3    Please fill asap and call patient to let them know it is ready. Also please discontinue 20 mg tablet.   esomeprazole (NEXIUM) 40 MG capsule    Sig: Take 1 capsule (40 mg total) by mouth daily at 12 noon.    Dispense:  90 capsule    Refill:  1   albuterol (VENTOLIN HFA) 108 (90 Base) MCG/ACT inhaler    Sig: Inhale 1-2 puffs into the lungs every 4 (four) hours as needed for wheezing or shortness of breath. INHALE 2 INHALATIONS INTO THE LUNGS EVERY 4 HOURS AS NEEDED FOR WHEEZE OR FOR SHORTNESS OF BREATH    Dispense:  8 g    Refill:  5   allopurinol (ZYLOPRIM) 300 MG tablet    Sig: Take 1 tablet (300 mg total) by mouth daily.    Dispense:  90 tablet    Refill:  1   atorvastatin (LIPITOR) 40 MG tablet    Sig: Take 1 tablet (40 mg total) by mouth daily.    Dispense:  90 tablet    Refill:  1    losartan-hydrochlorothiazide (HYZAAR) 100-12.5 MG tablet    Sig: Take 1 tablet by mouth daily.    Dispense:  90 tablet    Refill:  3    For future refills   KLOR-CON M10 10 MEQ tablet    Sig: Take 2 tablets (20 mEq total) by mouth daily.    Dispense:  180 tablet    Refill:  3    Return in about 4 months (around 04/23/2023) for F/U, Recheck A1C, [name] PCP.   Total time spent:30 Minutes Time spent includes review of chart, medications, test results, and follow up plan with the patient.   [place] Controlled Substance Database was reviewed by me.  This patient was seen by Sallyanne Kuster, FNP-C in collaboration with Dr. Beverely Risen as a part of collaborative care agreement.   Josslyn Ciolek R. Tedd Sias, MSN, FNP-C Internal medicine

## 2023-01-11 ENCOUNTER — Encounter: Payer: Self-pay | Admitting: Nurse Practitioner

## 2023-01-14 DIAGNOSIS — H2513 Age-related nuclear cataract, bilateral: Secondary | ICD-10-CM | POA: Diagnosis not present

## 2023-01-14 DIAGNOSIS — H401132 Primary open-angle glaucoma, bilateral, moderate stage: Secondary | ICD-10-CM | POA: Diagnosis not present

## 2023-01-14 DIAGNOSIS — E119 Type 2 diabetes mellitus without complications: Secondary | ICD-10-CM | POA: Diagnosis not present

## 2023-03-09 ENCOUNTER — Ambulatory Visit (INDEPENDENT_AMBULATORY_CARE_PROVIDER_SITE_OTHER): Payer: Medicare HMO | Admitting: Nurse Practitioner

## 2023-03-09 ENCOUNTER — Encounter: Payer: Self-pay | Admitting: Nurse Practitioner

## 2023-03-09 VITALS — BP 135/80 | HR 78 | Temp 96.7°F | Resp 16 | Ht 75.0 in | Wt 253.2 lb

## 2023-03-09 DIAGNOSIS — I2510 Atherosclerotic heart disease of native coronary artery without angina pectoris: Secondary | ICD-10-CM | POA: Diagnosis not present

## 2023-03-09 DIAGNOSIS — M1A0210 Idiopathic chronic gout, right elbow, without tophus (tophi): Secondary | ICD-10-CM

## 2023-03-09 DIAGNOSIS — E1165 Type 2 diabetes mellitus with hyperglycemia: Secondary | ICD-10-CM | POA: Diagnosis not present

## 2023-03-09 DIAGNOSIS — E782 Mixed hyperlipidemia: Secondary | ICD-10-CM | POA: Diagnosis not present

## 2023-03-09 DIAGNOSIS — I480 Paroxysmal atrial fibrillation: Secondary | ICD-10-CM

## 2023-03-09 MED ORDER — METFORMIN HCL ER 750 MG PO TB24: 750.0000 mg | ORAL_TABLET | Freq: Every day | ORAL | Status: DC

## 2023-03-09 MED ORDER — CARVEDILOL 25 MG PO TABS: 25.0000 mg | ORAL_TABLET | Freq: Two times a day (BID) | ORAL | 1 refills | Status: DC

## 2023-03-09 NOTE — Progress Notes (Signed)
[facility] [address] [place], [place] [address]  Internal MEDICINE  Office Visit Note  Patient Name: [name]  [id number]  [id number]  Date of Service: 03/09/2023  Chief Complaint  Patient presents with   Follow-up    Thinks he is on to many meds.     HPI [name] presents for a follow-up visit for feeling sleepy and fatigued  Feeling bad and feels like he is on too much medication.  Started feeling like he has been sleeping all the time about 3-4 weeks ago.  Discuss stopping tamsulosin with Dr. Lonna Cobb.    Current Medication: Outpatient Encounter Medications as of 03/09/2023  Medication Sig   albuterol (VENTOLIN HFA) 108 (90 Base) MCG/ACT inhaler Inhale 1-2 puffs into the lungs every 4 (four) hours as needed for wheezing or shortness of breath. INHALE 2 INHALATIONS INTO THE LUNGS EVERY 4 HOURS AS NEEDED FOR WHEEZE OR FOR SHORTNESS OF BREATH   allopurinol (ZYLOPRIM) 300 MG tablet Take 1 tablet (300 mg total) by mouth daily.   aspirin EC 81 MG tablet Take by mouth.   colchicine 0.6 MG tablet 2 TABS NOW THEN 1 MORE TAB 1 HOUR LATER ON FIRST DAY. THEN TAKE 1 TAB DAILY UNTIL FLARE UP RESOLVES   diltiazem (CARDIZEM CD) 240 MG 24 hr capsule Take 240 mg by mouth daily.   esomeprazole (NEXIUM) 40 MG capsule Take 1 capsule (40 mg total) by mouth daily at 12 noon.   furosemide (LASIX) 40 MG tablet Take 1 tablet (40 mg total) by mouth daily.   mirabegron ER (MYRBETRIQ) 50 MG TB24 tablet Take 1 tablet (50 mg total) by mouth daily.   tamsulosin (FLOMAX) 0.4 MG CAPS capsule TAKE 1 CAPSULE BY MOUTH EVERY DAY   [DISCONTINUED] carvedilol (COREG) 25 MG tablet Take 25 mg by mouth 2 (two) times daily.   [DISCONTINUED] losartan-hydrochlorothiazide (HYZAAR) 100-12.5 MG tablet Take 1 tablet by mouth daily.   [DISCONTINUED] metFORMIN (GLUCOPHAGE-XR) 750 MG 24 hr tablet Take 2 tablets (1,500 mg total) by mouth daily with breakfast.   apixaban (ELIQUIS) 5 MG TABS tablet Take 1 tablet  (5 mg total) by mouth 2 (two) times daily.   atorvastatin (LIPITOR) 40 MG tablet Take 1 tablet (40 mg total) by mouth daily.   carvedilol (COREG) 25 MG tablet Take 1 tablet (25 mg total) by mouth 2 (two) times daily.   metFORMIN (GLUCOPHAGE-XR) 750 MG 24 hr tablet Take 1 tablet (750 mg total) by mouth daily with breakfast.   [DISCONTINUED] KLOR-CON M10 10 MEQ tablet Take 2 tablets (20 mEq total) by mouth daily.   No facility-administered encounter medications on file as of 03/09/2023.    Surgical History: Past Surgical History:  Procedure Laterality Date   CARDIAC CATHETERIZATION     COLONOSCOPY WITH PROPOFOL N/A 01/24/2020   Procedure: COLONOSCOPY WITH PROPOFOL;  Surgeon: Wyline Mood, MD;  Location: [facility] ENDOSCOPY;  Service: Gastroenterology;  Laterality: N/A;   CORONARY/GRAFT ACUTE MI REVASCULARIZATION N/A 05/02/2018   Procedure: Coronary/Graft Acute MI Revascularization;  Surgeon: Alwyn Pea, MD;  Location: [facility] INVASIVE CV LAB;  Service: Cardiovascular;  Laterality: N/A;   HERNIA REPAIR     LEFT HEART CATH AND CORONARY ANGIOGRAPHY N/A 05/02/2018   Procedure: LEFT HEART CATH AND CORONARY ANGIOGRAPHY;  Surgeon: Alwyn Pea, MD;  Location: [facility] INVASIVE CV LAB;  Service: Cardiovascular;  Laterality: N/A;   PACEMAKER INSERTION Left 05/03/2018   Procedure: INSERTION PACEMAKER;  Surgeon: Marcina Millard, MD;  Location: [facility] ORS;  Service: Cardiovascular;  Laterality: Left;   RIGHT/LEFT HEART CATH AND CORONARY ANGIOGRAPHY Bilateral 12/30/2021   Procedure: RIGHT/LEFT HEART CATH AND CORONARY ANGIOGRAPHY;  Surgeon: Alwyn Pea, MD;  Location: [facility] INVASIVE CV LAB;  Service: Cardiovascular;  Laterality: Bilateral;   TEMPORARY PACEMAKER N/A 05/02/2018   Procedure: TEMPORARY PACEMAKER;  Surgeon: Alwyn Pea, MD;  Location: [facility] INVASIVE CV LAB;  Service: Cardiovascular;  Laterality: N/A;   TEMPORARY PACEMAKER Right 05/03/2018   Procedure: TEMPORARY PACEMAKER REMOVAL;   Surgeon: Marcina Millard, MD;  Location: [facility] ORS;  Service: Cardiovascular;  Laterality: Right;   TONSILLECTOMY Bilateral    1992    Medical History: Past Medical History:  Diagnosis Date   Diabetes mellitus without complication (HCC)    GERD (gastroesophageal reflux disease)    Hyperlipidemia    Hypertension     Family History: Family History  Problem Relation Age of Onset   Cirrhosis Father    Bone cancer Mother     Social History   Socioeconomic History   Marital status: Married    Spouse name: Not on file   Number of children: Not on file   Years of education: Not on file   Highest education level: Not on file  Occupational History   Not on file  Tobacco Use   Smoking status: Former    Types: Cigarettes   Smokeless tobacco: Never  Vaping Use   Vaping status: Never Used  Substance and Sexual Activity   Alcohol use: No   Drug use: No   Sexual activity: Not on file  Other Topics Concern   Not on file  Social History Narrative   Not on file   Social Determinants of Health   Financial Resource Strain: Not on file  Food Insecurity: Not on file  Transportation Needs: Not on file  Physical Activity: Not on file  Stress: Not on file  Social Connections: Not on file  Intimate Partner Violence: Not on file      Review of Systems  Constitutional:  Positive for fatigue. Negative for chills and fever.  HENT:  Negative for congestion, ear pain, postnasal drip, rhinorrhea, sinus pressure, sinus pain, sneezing and sore throat.   Respiratory:  Negative for cough, chest tightness, shortness of breath and wheezing.   Cardiovascular: Negative.  Negative for chest pain and palpitations.  Gastrointestinal: Negative.  Negative for constipation, diarrhea, nausea and vomiting.  Musculoskeletal:  Negative for myalgias.  Neurological:  Negative for headaches.    Vital Signs: BP 135/80 Comment: 150/88  Pulse 78   Temp (!) 96.7 F (35.9 C)   Resp 16   Ht 6\' 3"   (1.905 m)   Wt 253 lb 3.2 oz (114.9 kg)   SpO2 98%   BMI 31.65 kg/m    Physical Exam Vitals reviewed.  Constitutional:      General: He is not in acute distress.    Appearance: Normal appearance. He is obese. He is not ill-appearing.  HENT:     Head: Normocephalic and atraumatic.  Eyes:     Pupils: Pupils are equal, round, and reactive to light.  Cardiovascular:     Rate and Rhythm: Normal rate and regular rhythm.  Pulmonary:     Effort: Pulmonary effort is normal. No respiratory distress.  Neurological:     Mental Status: He is alert and oriented to person, place, and time.     Cranial Nerves: No cranial nerve deficit.     Coordination: Coordination normal.     Gait: Gait  normal.  Psychiatric:        Mood and Affect: Mood normal.        Behavior: Behavior normal.        Assessment/Plan: 1. Type 2 diabetes mellitus with hyperglycemia, without long-term current use of insulin (HCC) Metformin dose decreased to 1 tablet daily.  Routine labs ordered  - CMP14+EGFR - CBC with Differential/Platelet - Lipid Profile - Hgb A1C w/o eAG - metFORMIN (GLUCOPHAGE-XR) 750 MG 24 hr tablet; Take 1 tablet (750 mg total) by mouth daily with breakfast.  2. 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 PAF (paroxysmal atrial fibrillation) (HCC) Refills ordered, continue as prescribed.  - carvedilol (COREG) 25 MG tablet; Take 1 tablet (25 mg total) by mouth 2 (two) times daily.  Dispense: 180 tablet; Refill: 1  4. Mixed hyperlipidemia Routine labs ordered  - CMP14+EGFR - CBC with Differential/Platelet - Lipid Profile - Hgb A1C w/o eAG  5. Med changes  Continue myrbetriq Continue furosemide Stop tamsulosin, discuss with urology Only take colchicine for a gout flare not every day   General Counseling: Fatima Sanger understanding of the findings of todays visit and agrees with plan of treatment. I have discussed any further  diagnostic evaluation that may be needed or ordered today. We also reviewed his medications today. he has been encouraged to call the office with any questions or concerns that should arise related to todays visit.    Orders Placed This Encounter  Procedures   CMP14+EGFR   CBC with Differential/Platelet   Lipid Profile   Hgb A1C w/o eAG   Uric acid    Meds ordered this encounter  Medications   carvedilol (COREG) 25 MG tablet    Sig: Take 1 tablet (25 mg total) by mouth 2 (two) times daily.    Dispense:  180 tablet    Refill:  1   metFORMIN (GLUCOPHAGE-XR) 750 MG 24 hr tablet    Sig: Take 1 tablet (750 mg total) by mouth daily with breakfast.    Return in about 4 weeks (around 04/06/2023) for F/U, Koya Hunger PCP med changes eval.   Total time spent:30 Minutes Time spent includes review of chart, medications, test results, and follow up plan with the patient.   [place] Controlled Substance Database was reviewed by me.  This patient was seen by Sallyanne Kuster, FNP-C in collaboration with Dr. Beverely Risen as a part of collaborative care agreement.   Kevia Zaucha R. Tedd Sias, MSN, FNP-C Internal medicine

## 2023-03-10 LAB — LIPID PANEL
Chol/HDL Ratio: 2.6 ratio (ref 0.0–5.0)
Cholesterol, Total: 175 mg/dL (ref 100–199)
HDL: 67 mg/dL (ref >39)
LDL Chol Calc (NIH): 84 mg/dL (ref 0–99)
Triglycerides: 141 mg/dL (ref 0–149)
VLDL Cholesterol Cal: 24 mg/dL (ref 5–40)

## 2023-03-10 LAB — CBC WITH DIFFERENTIAL/PLATELET
Basophils Absolute: 0 10*3/uL (ref 0.0–0.2)
Basos: 1 %
EOS (ABSOLUTE): 0.3 10*3/uL (ref 0.0–0.4)
Eos: 7 %
Hematocrit: 41.9 % (ref 37.5–51.0)
Hemoglobin: 14.5 g/dL (ref 13.0–17.7)
Immature Grans (Abs): 0 10*3/uL (ref 0.0–0.1)
Immature Granulocytes: 0 %
Lymphocytes Absolute: 1.7 10*3/uL (ref 0.7–3.1)
Lymphs: 33 %
MCH: 28 pg (ref 26.6–33.0)
MCHC: 34.6 g/dL (ref 31.5–35.7)
MCV: 81 fL (ref 79–97)
Monocytes Absolute: 0.5 10*3/uL (ref 0.1–0.9)
Monocytes: 10 %
Neutrophils Absolute: 2.5 10*3/uL (ref 1.4–7.0)
Neutrophils: 49 %
Platelets: 172 10*3/uL (ref 150–450)
RBC: 5.17 x10E6/uL (ref 4.14–5.80)
RDW: 13.9 % (ref 11.6–15.4)
WBC: 5.1 10*3/uL (ref 3.4–10.8)

## 2023-03-10 LAB — HGB A1C W/O EAG: Hgb A1c MFr Bld: 7.9 % — ABNORMAL HIGH (ref 4.8–5.6)

## 2023-03-10 LAB — CMP14+EGFR
ALT: 27 [IU]/L (ref 0–44)
AST: 22 [IU]/L (ref 0–40)
Albumin: 4.4 g/dL (ref 3.8–4.8)
Alkaline Phosphatase: 101 [IU]/L (ref 44–121)
BUN/Creatinine Ratio: 18 (ref 10–24)
BUN: 21 mg/dL (ref 8–27)
Bilirubin Total: 0.6 mg/dL (ref 0.0–1.2)
CO2: 25 mmol/L (ref 20–29)
Calcium: 9.8 mg/dL (ref 8.6–10.2)
Chloride: 104 mmol/L (ref 96–106)
Creatinine, Ser: 1.15 mg/dL (ref 0.76–1.27)
Globulin, Total: 2.5 g/dL (ref 1.5–4.5)
Glucose: 124 mg/dL — ABNORMAL HIGH (ref 70–99)
Potassium: 3.6 mmol/L (ref 3.5–5.2)
Sodium: 144 mmol/L (ref 134–144)
Total Protein: 6.9 g/dL (ref 6.0–8.5)
eGFR: 65 mL/min/{1.73_m2} (ref >59)

## 2023-03-10 LAB — URIC ACID: Uric Acid: 5.6 mg/dL (ref 3.8–8.4)

## 2023-03-24 ENCOUNTER — Other Ambulatory Visit: Payer: Self-pay

## 2023-03-24 MED ORDER — AZITHROMYCIN 250 MG PO TABS: ORAL_TABLET | ORAL | 0 refills | Status: AC

## 2023-03-24 NOTE — Telephone Encounter (Signed)
Patient's daughter called to report sore throat, cough and sinus congestion. Negative Covid. Per Alyssa, okay to send Z-Pack for 5 days. Notified daughter.

## 2023-04-06 ENCOUNTER — Ambulatory Visit: Payer: Medicare HMO | Admitting: Nurse Practitioner

## 2023-04-17 ENCOUNTER — Other Ambulatory Visit: Payer: Self-pay | Admitting: Internal Medicine

## 2023-04-17 DIAGNOSIS — N400 Enlarged prostate without lower urinary tract symptoms: Secondary | ICD-10-CM

## 2023-04-23 ENCOUNTER — Ambulatory Visit: Payer: Medicare HMO | Admitting: Nurse Practitioner

## 2023-05-19 ENCOUNTER — Other Ambulatory Visit: Payer: Self-pay | Admitting: *Deleted

## 2023-05-19 DIAGNOSIS — Z8546 Personal history of malignant neoplasm of prostate: Secondary | ICD-10-CM

## 2023-05-27 ENCOUNTER — Other Ambulatory Visit: Payer: Medicare HMO

## 2023-05-29 ENCOUNTER — Ambulatory Visit: Payer: Medicare HMO | Admitting: Urology

## 2023-06-22 ENCOUNTER — Other Ambulatory Visit: Payer: Self-pay | Admitting: Nurse Practitioner

## 2023-06-22 DIAGNOSIS — Z79899 Other long term (current) drug therapy: Secondary | ICD-10-CM

## 2023-06-23 ENCOUNTER — Other Ambulatory Visit: Payer: Self-pay | Admitting: Nurse Practitioner

## 2023-06-23 DIAGNOSIS — Z79899 Other long term (current) drug therapy: Secondary | ICD-10-CM

## 2023-06-23 DIAGNOSIS — E782 Mixed hyperlipidemia: Secondary | ICD-10-CM

## 2023-07-02 ENCOUNTER — Telehealth: Payer: Self-pay | Admitting: Nurse Practitioner

## 2023-07-02 NOTE — Telephone Encounter (Signed)
 Received Chronic Care Verification from [facility]. Gave to [employer]

## 2023-07-08 ENCOUNTER — Telehealth: Payer: Self-pay | Admitting: Nurse Practitioner

## 2023-07-08 NOTE — Telephone Encounter (Signed)
 Chronic Care Verification completed. Faxed back to [facility]; [phone]. Scanned-Toni

## 2023-08-20 ENCOUNTER — Ambulatory Visit: Payer: Medicare HMO | Admitting: Nurse Practitioner

## 2023-08-21 ENCOUNTER — Other Ambulatory Visit: Payer: Self-pay | Admitting: Nurse Practitioner

## 2023-08-21 DIAGNOSIS — E1165 Type 2 diabetes mellitus with hyperglycemia: Secondary | ICD-10-CM

## 2023-09-17 ENCOUNTER — Other Ambulatory Visit: Payer: Self-pay | Admitting: Nurse Practitioner

## 2023-09-17 DIAGNOSIS — I480 Paroxysmal atrial fibrillation: Secondary | ICD-10-CM

## 2023-09-17 NOTE — Telephone Encounter (Signed)
 Last 3 no show

## 2023-09-20 ENCOUNTER — Other Ambulatory Visit: Payer: Self-pay | Admitting: Nurse Practitioner

## 2023-09-20 DIAGNOSIS — I480 Paroxysmal atrial fibrillation: Secondary | ICD-10-CM

## 2023-09-23 ENCOUNTER — Ambulatory Visit (INDEPENDENT_AMBULATORY_CARE_PROVIDER_SITE_OTHER): Admitting: Nurse Practitioner

## 2023-09-23 ENCOUNTER — Encounter: Payer: Self-pay | Admitting: Nurse Practitioner

## 2023-09-23 VITALS — BP 132/80 | HR 83 | Temp 98.4°F | Resp 16 | Ht 75.0 in | Wt 246.0 lb

## 2023-09-23 DIAGNOSIS — E1159 Type 2 diabetes mellitus with other circulatory complications: Secondary | ICD-10-CM | POA: Diagnosis not present

## 2023-09-23 DIAGNOSIS — E1169 Type 2 diabetes mellitus with other specified complication: Secondary | ICD-10-CM

## 2023-09-23 DIAGNOSIS — M1A0210 Idiopathic chronic gout, right elbow, without tophus (tophi): Secondary | ICD-10-CM

## 2023-09-23 DIAGNOSIS — E1165 Type 2 diabetes mellitus with hyperglycemia: Secondary | ICD-10-CM | POA: Diagnosis not present

## 2023-09-23 DIAGNOSIS — Z79899 Other long term (current) drug therapy: Secondary | ICD-10-CM

## 2023-09-23 DIAGNOSIS — I480 Paroxysmal atrial fibrillation: Secondary | ICD-10-CM

## 2023-09-23 DIAGNOSIS — N400 Enlarged prostate without lower urinary tract symptoms: Secondary | ICD-10-CM

## 2023-09-23 DIAGNOSIS — E782 Mixed hyperlipidemia: Secondary | ICD-10-CM

## 2023-09-23 DIAGNOSIS — E785 Hyperlipidemia, unspecified: Secondary | ICD-10-CM

## 2023-09-23 DIAGNOSIS — I152 Hypertension secondary to endocrine disorders: Secondary | ICD-10-CM

## 2023-09-23 LAB — POCT GLYCOSYLATED HEMOGLOBIN (HGB A1C): Hemoglobin A1C: 7.7 % — AB (ref 4.0–5.6)

## 2023-09-23 MED ORDER — COLCHICINE 0.6 MG PO TABS: ORAL_TABLET | ORAL | 1 refills | Status: DC

## 2023-09-23 MED ORDER — TAMSULOSIN HCL 0.4 MG PO CAPS: 0.4000 mg | ORAL_CAPSULE | Freq: Every day | ORAL | 1 refills | Status: AC

## 2023-09-23 MED ORDER — ALLOPURINOL 300 MG PO TABS: 300.0000 mg | ORAL_TABLET | Freq: Every day | ORAL | 1 refills | Status: AC

## 2023-09-23 MED ORDER — ATORVASTATIN CALCIUM 40 MG PO TABS: 40.0000 mg | ORAL_TABLET | Freq: Every day | ORAL | 1 refills | Status: AC

## 2023-09-23 MED ORDER — CARVEDILOL 25 MG PO TABS: 25.0000 mg | ORAL_TABLET | Freq: Two times a day (BID) | ORAL | 3 refills | Status: AC

## 2023-09-23 NOTE — Progress Notes (Signed)
 [facility] [address] [place], [place] [address]  Internal MEDICINE  Office Visit Note  Patient Name: [name]  [id number]  [id number]  Date of Service: 09/23/2023  Chief Complaint  Patient presents with   Diabetes   Gastroesophageal Reflux   Hyperlipidemia   Hypertension   Follow-up    HPI Budd presents for a follow-up visit for diabetes, PAH, AFIB, hypertension, high cholesterol and enlarged prostate.  Diabetes -- A1c is slightly improved today but still slightly elevated. Currently taking metformin .  PAH -- sees cardiology AFIB -- on amiodarone and diltiazem  and eliquis  Hypertension -- controlled on multiple medications, sees cardiology  High cholesterol -- taking atorvastatin  Enlarged prostate -- takes tamsulosin     Current Medication: Outpatient Encounter Medications as of 09/23/2023  Medication Sig   albuterol  (VENTOLIN  HFA) 108 (90 Base) MCG/ACT inhaler Inhale 1-2 puffs into the lungs every 4 (four) hours as needed for wheezing or shortness of breath. INHALE 2 INHALATIONS INTO THE LUNGS EVERY 4 HOURS AS NEEDED FOR WHEEZE OR FOR SHORTNESS OF BREATH   aspirin  EC 81 MG tablet Take by mouth.   diltiazem  (CARDIZEM  CD) 240 MG 24 hr capsule Take 240 mg by mouth daily.   esomeprazole  (NEXIUM ) 40 MG capsule TAKE 1 CAPSULE (40 MG TOTAL) BY MOUTH DAILY AT 12 NOON.   furosemide  (LASIX ) 40 MG tablet Take 1 tablet (40 mg total) by mouth daily.   latanoprost (XALATAN) 0.005 % ophthalmic solution Apply 1 drop to eye.   metFORMIN  (GLUCOPHAGE -XR) 750 MG 24 hr tablet TAKE 1 TABLET BY MOUTH EVERY DAY WITH BREAKFAST   mirabegron  ER (MYRBETRIQ ) 50 MG TB24 tablet Take 1 tablet (50 mg total) by mouth daily.   SIMBRINZA 1-0.2 % SUSP Apply 1 drop to eye 3 (three) times daily.   timolol (TIMOPTIC) 0.5 % ophthalmic solution 1 drop 2 (two) times daily.   [DISCONTINUED] allopurinol  (ZYLOPRIM ) 300 MG tablet TAKE 1 TABLET BY MOUTH EVERY DAY   [DISCONTINUED] atorvastatin   (LIPITOR) 40 MG tablet TAKE 1 TABLET BY MOUTH EVERY DAY   [DISCONTINUED] carvedilol  (COREG ) 25 MG tablet TAKE 1 TABLET BY MOUTH TWICE A DAY   [DISCONTINUED] colchicine  0.6 MG tablet 2 TABS NOW THEN 1 MORE TAB 1 HOUR LATER ON FIRST DAY. THEN TAKE 1 TAB DAILY UNTIL FLARE UP RESOLVES   [DISCONTINUED] tamsulosin  (FLOMAX ) 0.4 MG CAPS capsule TAKE 1 CAPSULE BY MOUTH EVERY DAY   allopurinol  (ZYLOPRIM ) 300 MG tablet Take 1 tablet (300 mg total) by mouth daily.   amiodarone (PACERONE) 200 MG tablet Take by mouth.   apixaban  (ELIQUIS ) 5 MG TABS tablet Take 1 tablet (5 mg total) by mouth 2 (two) times daily.   atorvastatin  (LIPITOR) 40 MG tablet Take 1 tablet (40 mg total) by mouth daily.   carvedilol  (COREG ) 25 MG tablet Take 1 tablet (25 mg total) by mouth 2 (two) times daily.   colchicine  0.6 MG tablet 2 TABS NOW THEN 1 MORE TAB 1 HOUR LATER ON FIRST DAY. THEN TAKE 1 TAB DAILY UNTIL FLARE UP RESOLVES   losartan  (COZAAR ) 100 MG tablet Take 100 mg by mouth daily.   spironolactone (ALDACTONE) 25 MG tablet Take 12.5 mg by mouth daily.   tamsulosin  (FLOMAX ) 0.4 MG CAPS capsule Take 1 capsule (0.4 mg total) by mouth daily.   No facility-administered encounter medications on file as of 09/23/2023.    Surgical History: Past Surgical History:  Procedure Laterality Date   CARDIAC CATHETERIZATION     COLONOSCOPY WITH PROPOFOL   N/A 01/24/2020   Procedure: COLONOSCOPY WITH PROPOFOL ;  Surgeon: Therisa Bi, MD;  Location: [facility] ENDOSCOPY;  Service: Gastroenterology;  Laterality: N/A;   CORONARY/GRAFT ACUTE MI REVASCULARIZATION N/A 05/02/2018   Procedure: Coronary/Graft Acute MI Revascularization;  Surgeon: Florencio Cara BIRCH, MD;  Location: [facility] INVASIVE CV LAB;  Service: Cardiovascular;  Laterality: N/A;   HERNIA REPAIR     LEFT HEART CATH AND CORONARY ANGIOGRAPHY N/A 05/02/2018   Procedure: LEFT HEART CATH AND CORONARY ANGIOGRAPHY;  Surgeon: Florencio Cara BIRCH, MD;  Location: [facility] INVASIVE CV LAB;  Service:  Cardiovascular;  Laterality: N/A;   PACEMAKER INSERTION Left 05/03/2018   Procedure: INSERTION PACEMAKER;  Surgeon: Ammon Blunt, MD;  Location: [facility] ORS;  Service: Cardiovascular;  Laterality: Left;   RIGHT/LEFT HEART CATH AND CORONARY ANGIOGRAPHY Bilateral 12/30/2021   Procedure: RIGHT/LEFT HEART CATH AND CORONARY ANGIOGRAPHY;  Surgeon: Florencio Cara BIRCH, MD;  Location: [facility] INVASIVE CV LAB;  Service: Cardiovascular;  Laterality: Bilateral;   TEMPORARY PACEMAKER N/A 05/02/2018   Procedure: TEMPORARY PACEMAKER;  Surgeon: Florencio Cara BIRCH, MD;  Location: [facility] INVASIVE CV LAB;  Service: Cardiovascular;  Laterality: N/A;   TEMPORARY PACEMAKER Right 05/03/2018   Procedure: TEMPORARY PACEMAKER REMOVAL;  Surgeon: Ammon Blunt, MD;  Location: [facility] ORS;  Service: Cardiovascular;  Laterality: Right;   TONSILLECTOMY Bilateral    1992    Medical History: Past Medical History:  Diagnosis Date   Diabetes mellitus without complication (HCC)    GERD (gastroesophageal reflux disease)    Hyperlipidemia    Hypertension     Family History: Family History  Problem Relation Age of Onset   Cirrhosis Father    Bone cancer Mother     Social History   Socioeconomic History   Marital status: Married    Spouse name: Not on file   Number of children: Not on file   Years of education: Not on file   Highest education level: Not on file  Occupational History   Not on file  Tobacco Use   Smoking status: Former    Types: Cigarettes   Smokeless tobacco: Never  Vaping Use   Vaping status: Never Used  Substance and Sexual Activity   Alcohol use: No   Drug use: No   Sexual activity: Not on file  Other Topics Concern   Not on file  Social History Narrative   Not on file   Social Drivers of Health   Financial Resource Strain: Low Risk  (09/08/2023)   Received from [facility]   Overall Financial Resource Strain (CARDIA)    Difficulty of Paying Living Expenses:  Not hard at all  Food Insecurity: No Food Insecurity (09/08/2023)   Received from [facility]   Hunger Vital Sign    Worried About Running Out of Food in the Last Year: Never true    Ran Out of Food in the Last Year: Never true  Transportation Needs: No Transportation Needs (09/08/2023)   Received from [facility] - Transportation    In the past 12 months, has lack of transportation kept you from medical appointments or from getting medications?: No    Lack of Transportation (Non-Medical): No  Physical Activity: Not on file  Stress: Not on file  Social Connections: Not on file  Intimate Partner Violence: Not on file      Review of Systems  Constitutional:  Positive for fatigue. Negative for chills and fever.  HENT:  Negative for congestion, ear pain, postnasal drip,  rhinorrhea, sinus pressure, sinus pain, sneezing and sore throat.   Respiratory:  Negative for cough, chest tightness, shortness of breath and wheezing.   Cardiovascular: Negative.  Negative for chest pain and palpitations.  Gastrointestinal: Negative.  Negative for constipation, diarrhea, nausea and vomiting.  Musculoskeletal:  Negative for myalgias.  Neurological:  Negative for headaches.    Vital Signs: BP 132/80   Pulse 83   Temp 98.4 F (36.9 C)   Resp 16   Ht 6' 3 (1.905 m)   Wt 246 lb (111.6 kg)   SpO2 99%   BMI 30.75 kg/m    Physical Exam Vitals reviewed.  Constitutional:      General: He is not in acute distress.    Appearance: Normal appearance. He is obese. He is not ill-appearing.  HENT:     Head: Normocephalic and atraumatic.   Eyes:     Pupils: Pupils are equal, round, and reactive to light.    Cardiovascular:     Rate and Rhythm: Normal rate and regular rhythm.  Pulmonary:     Effort: Pulmonary effort is normal. No respiratory distress.   Neurological:     Mental Status: He is alert and oriented to person, place, and time.     Cranial  Nerves: No cranial nerve deficit.     Coordination: Coordination normal.     Gait: Gait normal.   Psychiatric:        Mood and Affect: Mood normal.        Behavior: Behavior normal.        Assessment/Plan: 1. Type 2 diabetes mellitus with other specified complication, without long-term current use of insulin  (HCC) (Primary) A1c is improving but still elevated. Continue metformin  as prescribed.  - POCT glycosylated hemoglobin (Hb A1C)  2. PAF (paroxysmal atrial fibrillation) (HCC) Continue medications as prescribed and follow up with cardiology.  - amiodarone (PACERONE) 200 MG tablet; Take by mouth. - carvedilol  (COREG ) 25 MG tablet; Take 1 tablet (25 mg total) by mouth 2 (two) times daily.  Dispense: 180 tablet; Refill: 3  3. Hypertension associated with diabetes (HCC) Stable, continue medications as prescribed. Followed by cardiology - losartan  (COZAAR ) 100 MG tablet; Take 100 mg by mouth daily. - spironolactone (ALDACTONE) 25 MG tablet; Take 12.5 mg by mouth daily. - carvedilol  (COREG ) 25 MG tablet; Take 1 tablet (25 mg total) by mouth 2 (two) times daily.  Dispense: 180 tablet; Refill: 3  4. Hyperlipidemia associated with type 2 diabetes mellitus (HCC) Continue atorvastatin  as prescribed. - atorvastatin  (LIPITOR) 40 MG tablet; Take 1 tablet (40 mg total) by mouth daily.  Dispense: 90 tablet; Refill: 1  5. 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  - allopurinol  (ZYLOPRIM ) 300 MG tablet; Take 1 tablet (300 mg total) by mouth daily.  Dispense: 90 tablet; Refill: 1 - colchicine  0.6 MG tablet; 2 TABS NOW THEN 1 MORE TAB 1 HOUR LATER ON FIRST DAY. THEN TAKE 1 TAB DAILY UNTIL FLARE UP RESOLVES  Dispense: 180 tablet; Refill: 1  6. Enlarged prostate Continue tamsulosin  as prescribed.  - tamsulosin  (FLOMAX ) 0.4 MG CAPS capsule; Take 1 capsule (0.4 mg total) by mouth daily.  Dispense: 90 capsule; Refill: 1  7. Encounter  for medication review Medication list reviewed, updated and refills ordered  - SIMBRINZA 1-0.2 % SUSP; Apply 1 drop to eye 3 (three) times daily. - latanoprost (XALATAN) 0.005 % ophthalmic solution; Apply 1 drop to eye. - timolol (TIMOPTIC) 0.5 % ophthalmic  solution; 1 drop 2 (two) times daily.   General Counseling: purnell daigle understanding of the findings of todays visit and agrees with plan of treatment. I have discussed any further diagnostic evaluation that may be needed or ordered today. We also reviewed his medications today. he has been encouraged to call the office with any questions or concerns that should arise related to todays visit.    Orders Placed This Encounter  Procedures   POCT glycosylated hemoglobin (Hb A1C)    Meds ordered this encounter  Medications   tamsulosin  (FLOMAX ) 0.4 MG CAPS capsule    Sig: Take 1 capsule (0.4 mg total) by mouth daily.    Dispense:  90 capsule    Refill:  1   allopurinol  (ZYLOPRIM ) 300 MG tablet    Sig: Take 1 tablet (300 mg total) by mouth daily.    Dispense:  90 tablet    Refill:  1   atorvastatin  (LIPITOR) 40 MG tablet    Sig: Take 1 tablet (40 mg total) by mouth daily.    Dispense:  90 tablet    Refill:  1   colchicine  0.6 MG tablet    Sig: 2 TABS NOW THEN 1 MORE TAB 1 HOUR LATER ON FIRST DAY. THEN TAKE 1 TAB DAILY UNTIL FLARE UP RESOLVES    Dispense:  180 tablet    Refill:  1   carvedilol  (COREG ) 25 MG tablet    Sig: Take 1 tablet (25 mg total) by mouth 2 (two) times daily.    Dispense:  180 tablet    Refill:  3    Return for AWV, Shavelle Runkel PCP needs to be scheduled any time in june.   Total time spent:30 Minutes Time spent includes review of chart, medications, test results, and follow up plan with the patient.   [place] Controlled Substance Database was reviewed by me.  This patient was seen by Mardy Maxin, FNP-C in collaboration with Dr. Sigrid Bathe as a part of collaborative care agreement.   Tanisia Yokley R.  Maxin, MSN, FNP-C Internal medicine

## 2023-10-22 ENCOUNTER — Ambulatory Visit: Admitting: Nurse Practitioner

## 2023-10-22 ENCOUNTER — Encounter: Payer: Self-pay | Admitting: Nurse Practitioner

## 2023-10-22 VITALS — BP 136/72 | HR 67 | Temp 98.1°F | Resp 16 | Ht 75.0 in | Wt 246.6 lb

## 2023-10-22 DIAGNOSIS — J0110 Acute frontal sinusitis, unspecified: Secondary | ICD-10-CM | POA: Diagnosis not present

## 2023-10-22 DIAGNOSIS — I480 Paroxysmal atrial fibrillation: Secondary | ICD-10-CM

## 2023-10-22 DIAGNOSIS — I10 Essential (primary) hypertension: Secondary | ICD-10-CM | POA: Diagnosis not present

## 2023-10-22 DIAGNOSIS — Z0000 Encounter for general adult medical examination without abnormal findings: Secondary | ICD-10-CM

## 2023-10-22 DIAGNOSIS — N400 Enlarged prostate without lower urinary tract symptoms: Secondary | ICD-10-CM

## 2023-10-22 DIAGNOSIS — E1165 Type 2 diabetes mellitus with hyperglycemia: Secondary | ICD-10-CM | POA: Diagnosis not present

## 2023-10-22 MED ORDER — AZITHROMYCIN 250 MG PO TABS: ORAL_TABLET | ORAL | 0 refills | Status: AC

## 2023-10-22 NOTE — Progress Notes (Signed)
 [facility] [address] [place], [place] [address]  Internal MEDICINE  Office Visit Note  Patient Name: [name]  [id number]  [id number]  Date of Service: 10/22/2023  Chief Complaint  Patient presents with   Diabetes   Gastroesophageal Reflux   Hypertension   Hyperlipidemia   Medicare Wellness    HPI Crit presents for a medicare annual wellness visit.  Well-appearing 78 y.o. male with type 2 diabetes, high cholesterol, hypertension, enlarged prostate, afib, CAD, GERD, pacemaker, gout, and history of prostate cancer Routine CRC screening: discontinued  Eye exam: [facility]   foot exam: done  Labs: due for labs later in the year  New or worsening pain: none  Other concerns: none      10/22/2023    3:04 PM 08/19/2022    8:53 AM 08/16/2021   10:26 AM  MMSE - Mini Mental State Exam  Orientation to time 5 5 5   Orientation to Place 5 5 5   Registration 3 3 3   Attention/ Calculation 5 5 5   Recall 3 3 0  Language- name 2 objects 2 2 2   Language- repeat 1 1 1   Language- follow 3 step command 3 3 3   Language- read & follow direction 1 1 1   Write a sentence 1 1 1   Copy design 1 1 1   Total score 30 30 27     Functional Status Survey: Is the patient deaf or have difficulty hearing?: No Does the patient have difficulty seeing, even when wearing glasses/contacts?: No Does the patient have difficulty concentrating, remembering, or making decisions?: No Does the patient have difficulty walking or climbing stairs?: No Does the patient have difficulty dressing or bathing?: No Does the patient have difficulty doing errands alone such as visiting a doctor's office or shopping?: No     02/25/2022    8:47 AM 04/26/2022    1:24 PM 08/19/2022    8:52 AM 12/22/2022   10:43 AM 10/22/2023    3:03 PM  Fall Risk  Falls in the past year?   0 0 0  Was there an injury with Fall?   0  0  Fall Risk Category Calculator   0  0  (RETIRED) Patient Fall Risk Level  Low fall risk  Low fall risk      Patient at Risk for Falls Due to   No Fall Risks  No Fall Risks  Fall risk Follow up   Falls evaluation completed  Falls evaluation completed     Data saved with a previous flowsheet row definition       10/22/2023    3:03 PM  Depression screen PHQ 2/9  Decreased Interest 0  Down, Depressed, Hopeless 0  PHQ - 2 Score 0      Current Medication: Outpatient Encounter Medications as of 10/22/2023  Medication Sig   azithromycin  (ZITHROMAX ) 250 MG tablet Take 2 tablets on day 1, then 1 tablet daily on days 2 through 5   albuterol  (VENTOLIN  HFA) 108 (90 Base) MCG/ACT inhaler Inhale 1-2 puffs into the lungs every 4 (four) hours as needed for wheezing or shortness of breath. INHALE 2 INHALATIONS INTO THE LUNGS EVERY 4 HOURS AS NEEDED FOR WHEEZE OR FOR SHORTNESS OF BREATH   allopurinol  (ZYLOPRIM ) 300 MG tablet Take 1 tablet (300 mg total) by mouth daily.   amiodarone (PACERONE) 200 MG tablet Take by mouth.   apixaban  (ELIQUIS ) 5 MG TABS tablet Take 1 tablet (5 mg total) by mouth 2 (two)  times daily.   aspirin  EC 81 MG tablet Take by mouth.   atorvastatin  (LIPITOR) 40 MG tablet Take 1 tablet (40 mg total) by mouth daily.   carvedilol  (COREG ) 25 MG tablet Take 1 tablet (25 mg total) by mouth 2 (two) times daily.   colchicine  0.6 MG tablet 2 TABS NOW THEN 1 MORE TAB 1 HOUR LATER ON FIRST DAY. THEN TAKE 1 TAB DAILY UNTIL FLARE UP RESOLVES   diltiazem  (CARDIZEM  CD) 240 MG 24 hr capsule Take 240 mg by mouth daily.   esomeprazole  (NEXIUM ) 40 MG capsule TAKE 1 CAPSULE (40 MG TOTAL) BY MOUTH DAILY AT 12 NOON.   furosemide  (LASIX ) 40 MG tablet Take 1 tablet (40 mg total) by mouth daily.   latanoprost (XALATAN) 0.005 % ophthalmic solution Apply 1 drop to eye.   losartan  (COZAAR ) 100 MG tablet Take 100 mg by mouth daily.   metFORMIN  (GLUCOPHAGE -XR) 750 MG 24 hr tablet TAKE 1 TABLET BY MOUTH EVERY DAY WITH BREAKFAST   mirabegron  ER (MYRBETRIQ ) 50 MG TB24 tablet Take 1  tablet (50 mg total) by mouth daily.   SIMBRINZA 1-0.2 % SUSP Apply 1 drop to eye 3 (three) times daily.   spironolactone (ALDACTONE) 25 MG tablet Take 12.5 mg by mouth daily.   tamsulosin  (FLOMAX ) 0.4 MG CAPS capsule Take 1 capsule (0.4 mg total) by mouth daily.   timolol (TIMOPTIC) 0.5 % ophthalmic solution 1 drop 2 (two) times daily.   No facility-administered encounter medications on file as of 10/22/2023.    Surgical History: Past Surgical History:  Procedure Laterality Date   CARDIAC CATHETERIZATION     COLONOSCOPY WITH PROPOFOL  N/A 01/24/2020   Procedure: COLONOSCOPY WITH PROPOFOL ;  Surgeon: Luke Salaam, MD;  Location: [facility] ENDOSCOPY;  Service: Gastroenterology;  Laterality: N/A;   CORONARY/GRAFT ACUTE MI REVASCULARIZATION N/A 05/02/2018   Procedure: Coronary/Graft Acute MI Revascularization;  Surgeon: Antonette Batters, MD;  Location: [facility] INVASIVE CV LAB;  Service: Cardiovascular;  Laterality: N/A;   HERNIA REPAIR     LEFT HEART CATH AND CORONARY ANGIOGRAPHY N/A 05/02/2018   Procedure: LEFT HEART CATH AND CORONARY ANGIOGRAPHY;  Surgeon: Antonette Batters, MD;  Location: [facility] INVASIVE CV LAB;  Service: Cardiovascular;  Laterality: N/A;   PACEMAKER INSERTION Left 05/03/2018   Procedure: INSERTION PACEMAKER;  Surgeon: Percival Brace, MD;  Location: [facility] ORS;  Service: Cardiovascular;  Laterality: Left;   RIGHT/LEFT HEART CATH AND CORONARY ANGIOGRAPHY Bilateral 12/30/2021   Procedure: RIGHT/LEFT HEART CATH AND CORONARY ANGIOGRAPHY;  Surgeon: Antonette Batters, MD;  Location: [facility] INVASIVE CV LAB;  Service: Cardiovascular;  Laterality: Bilateral;   TEMPORARY PACEMAKER N/A 05/02/2018   Procedure: TEMPORARY PACEMAKER;  Surgeon: Antonette Batters, MD;  Location: [facility] INVASIVE CV LAB;  Service: Cardiovascular;  Laterality: N/A;   TEMPORARY PACEMAKER Right 05/03/2018   Procedure: TEMPORARY PACEMAKER REMOVAL;  Surgeon: Percival Brace, MD;  Location: [facility] ORS;  Service:  Cardiovascular;  Laterality: Right;   TONSILLECTOMY Bilateral    1992    Medical History: Past Medical History:  Diagnosis Date   Diabetes mellitus without complication (HCC)    GERD (gastroesophageal reflux disease)    Hyperlipidemia    Hypertension     Family History: Family History  Problem Relation Age of Onset   Cirrhosis Father    Bone cancer Mother     Social History   Socioeconomic History   Marital status: Married    Spouse name: Not on file   Number of children: Not on file   Years  of education: Not on file   Highest education level: Not on file  Occupational History   Not on file  Tobacco Use   Smoking status: Former    Types: Cigarettes   Smokeless tobacco: Never  Vaping Use   Vaping status: Never Used  Substance and Sexual Activity   Alcohol use: No   Drug use: No   Sexual activity: Not on file  Other Topics Concern   Not on file  Social History Narrative   Not on file   Social Drivers of Health   Financial Resource Strain: Low Risk  (09/08/2023)   [facility]   Overall Financial Resource Strain (CARDIA)    Difficulty of Paying Living Expenses: Not hard at all  Food Insecurity: No Food Insecurity (09/08/2023)   Received from [facility]   Hunger Vital Sign    Within the past 12 months, you worried that your food would run out before you got the money to buy more.: Never true    Within the past 12 months, the food you bought just didn't last and you didn't have money to get more.: Never true  Transportation Needs: No Transportation Needs (09/08/2023)   Received from [facility] - Transportation    In the past 12 months, has lack of transportation kept you from medical appointments or from getting medications?: No    Lack of Transportation (Non-Medical): No  Physical Activity: Not on file  Stress: Not on file  Social Connections: Not on file  Intimate Partner Violence:  Not on file      Review of Systems  Constitutional:  Negative for activity change, appetite change, chills, fatigue, fever and unexpected weight change.  HENT:  Positive for congestion, postnasal drip, rhinorrhea, sinus pressure and sore throat. Negative for ear pain and trouble swallowing.   Eyes: Negative.   Respiratory:  Positive for cough. Negative for chest tightness, shortness of breath and wheezing.   Cardiovascular: Negative.  Negative for chest pain and palpitations.  Gastrointestinal: Negative.  Negative for abdominal pain, blood in stool, constipation, diarrhea, nausea and vomiting.  Endocrine: Negative.   Genitourinary: Negative.  Negative for difficulty urinating, dysuria, frequency, hematuria and urgency.  Musculoskeletal: Negative.  Negative for arthralgias, back pain, joint swelling, myalgias and neck pain.  Skin: Negative.  Negative for rash and wound.  Allergic/Immunologic: Negative.  Negative for immunocompromised state.  Neurological:  Positive for headaches. Negative for dizziness, seizures and numbness.  Hematological: Negative.   Psychiatric/Behavioral: Negative.  Negative for behavioral problems, self-injury and suicidal ideas. The patient is not nervous/anxious.     Vital Signs: BP 136/72   Pulse 67   Temp 98.1 F (36.7 C)   Resp 16   Ht 6' 3 (1.905 m)   Wt 246 lb 9.6 oz (111.9 kg)   SpO2 98%   BMI 30.82 kg/m    Physical Exam Vitals reviewed.  Constitutional:      General: He is awake. He is not in acute distress.    Appearance: Normal appearance. He is well-developed and well-groomed. He is obese. He is not ill-appearing or diaphoretic.  HENT:     Head: Normocephalic and atraumatic.     Right Ear: Tympanic membrane, ear canal and external ear normal.     Left Ear: Tympanic membrane, ear canal and external ear normal.     Nose: Congestion and rhinorrhea present.     Mouth/Throat:     Lips: Pink.  Mouth: Mucous membranes are moist.      Pharynx: Uvula midline. Posterior oropharyngeal erythema present. No oropharyngeal exudate.   Eyes:     General: Lids are normal. Vision grossly intact. Gaze aligned appropriately. No scleral icterus.       Right eye: No discharge.        Left eye: No discharge.     Extraocular Movements: Extraocular movements intact.     Conjunctiva/sclera: Conjunctivae normal.     Pupils: Pupils are equal, round, and reactive to light.     Funduscopic exam:    Right eye: Red reflex present.        Left eye: Red reflex present.  Neck:     Thyroid : No thyromegaly.     Vascular: No JVD.     Trachea: Trachea and phonation normal. No tracheal deviation.   Cardiovascular:     Rate and Rhythm: Normal rate and regular rhythm.     Pulses:          Dorsalis pedis pulses are 2+ on the right side and 2+ on the left side.       Posterior tibial pulses are 1+ on the right side and 1+ on the left side.     Heart sounds: Normal heart sounds, S1 normal and S2 normal. No murmur heard.    No friction rub. No gallop.  Pulmonary:     Effort: Pulmonary effort is normal. No accessory muscle usage or respiratory distress.     Breath sounds: Normal breath sounds and air entry. No stridor. No wheezing or rales.  Chest:     Chest wall: No tenderness.  Abdominal:     General: Bowel sounds are normal. There is no distension.     Palpations: Abdomen is soft. There is no shifting dullness, fluid wave, mass or pulsatile mass.     Tenderness: There is no abdominal tenderness. There is no guarding or rebound.   Musculoskeletal:        General: No tenderness or deformity. Normal range of motion.     Cervical back: Normal range of motion and neck supple.     Right lower leg: 1+ Pitting Edema present.     Left lower leg: 1+ Pitting Edema present.     Right foot: Normal range of motion. No deformity, bunion, Charcot foot, foot drop or prominent metatarsal heads.     Left foot: Normal range of motion. No deformity, bunion,  Charcot foot, foot drop or prominent metatarsal heads.  Feet:     Right foot:     Protective Sensation: 6 sites tested.  6 sites sensed.     Skin integrity: Callus and dry skin present. No ulcer, blister, skin breakdown, erythema, warmth or fissure.     Toenail Condition: Right toenails are abnormally thick. Fungal disease present.    Left foot:     Protective Sensation: 6 sites tested.  6 sites sensed.     Skin integrity: Callus and dry skin present. No ulcer, blister, skin breakdown, erythema, warmth or fissure.     Toenail Condition: Left toenails are abnormally thick. Fungal disease present. Lymphadenopathy:     Cervical: No cervical adenopathy.   Skin:    General: Skin is warm and dry.     Capillary Refill: Capillary refill takes less than 2 seconds.     Coloration: Skin is not pale.     Findings: No erythema or rash.   Neurological:     Mental Status: He is alert and oriented  to person, place, and time.     Cranial Nerves: No cranial nerve deficit.     Motor: No abnormal muscle tone.     Coordination: Coordination normal.     Deep Tendon Reflexes: Reflexes are normal and symmetric.   Psychiatric:        Mood and Affect: Mood normal.        Behavior: Behavior normal. Behavior is cooperative.        Thought Content: Thought content normal.        Judgment: Judgment normal.        Assessment/Plan: 1. Encounter for subsequent annual wellness visit (AWV) in Medicare patient (Primary) Age-appropriate preventive screenings and vaccinations discussed. Routine labs for health maintenance are up to date for now, will repeat labs in the fall. PHM updated.    2. Acute non-recurrent frontal sinusitis Zpak prescribed, take until gone  - azithromycin  (ZITHROMAX ) 250 MG tablet; Take 2 tablets on day 1, then 1 tablet daily on days 2 through 5  Dispense: 6 tablet; Refill: 0  3. PAF (paroxysmal atrial fibrillation) (HCC) Continue amiodarone, diltiazem  and carvedilol  as  prescribed.  4. Essential hypertension Stable, continue carvedilol , diltiazem , furosemide , losartan  and spironolactone as prescribed.   5. Type 2 diabetes mellitus with hyperglycemia, without long-term current use of insulin  (HCC) Urine sent for microalbumin/creatinine ratio. Continue metformin  as prescribed.  - Urine Microalbumin w/creat. ratio  6. Enlarged prostate Continue tamsulosin  as prescribed     General Counseling: [name] understanding of the findings of todays visit and agrees with plan of treatment. I have discussed any further diagnostic evaluation that may be needed or ordered today. We also reviewed his medications today. he has been encouraged to call the office with any questions or concerns that should arise related to todays visit.    Orders Placed This Encounter  Procedures   Urine Microalbumin w/creat. ratio    Meds ordered this encounter  Medications   azithromycin  (ZITHROMAX ) 250 MG tablet    Sig: Take 2 tablets on day 1, then 1 tablet daily on days 2 through 5    Dispense:  6 tablet    Refill:  0    Fill new script today    Return in about 3 months (around 01/22/2024) for F/U, Recheck A1C, [name] PCP.   Total time spent:30 Minutes Time spent includes review of chart, medications, test results, and follow up plan with the patient.    Controlled Substance Database was reviewed by me.  This patient was seen by Laurence Pons, FNP-C in collaboration with Dr. Verneta Gone as a part of collaborative care agreement.  Beda Dula R. Bobbi Burow, MSN, FNP-C Internal medicine

## 2023-10-23 ENCOUNTER — Encounter: Payer: Self-pay | Admitting: Nurse Practitioner

## 2023-10-23 LAB — MICROALBUMIN / CREATININE URINE RATIO
Creatinine, Urine: 236.2 mg/dL
Microalb/Creat Ratio: 9 mg/g{creat} (ref 0–29)
Microalbumin, Urine: 21.3 ug/mL

## 2023-11-16 ENCOUNTER — Telehealth: Payer: Self-pay | Admitting: Urology

## 2023-11-16 ENCOUNTER — Other Ambulatory Visit: Payer: Self-pay | Admitting: *Deleted

## 2023-11-16 MED ORDER — MIRABEGRON ER 50 MG PO TB24: 50.0000 mg | ORAL_TABLET | Freq: Every day | ORAL | 0 refills | Status: DC

## 2023-11-16 NOTE — Telephone Encounter (Signed)
 Called patient and let him know that one month refill was sent to pharmacy

## 2023-11-16 NOTE — Telephone Encounter (Signed)
 Patient's daughter called to request refill for Myrbetriq . Patient is currently out of medication. He missed his one year follow up and lab appts in January. Those appointments have been rescheduled for 12/01/23 for lab, and 12/04/23 with Dr. Twylla. She is asking if he can have refill prior to being seen. Pharmacy is [facility] in [place]. Please advise patient.

## 2023-12-01 ENCOUNTER — Other Ambulatory Visit

## 2023-12-03 ENCOUNTER — Other Ambulatory Visit

## 2023-12-03 DIAGNOSIS — Z8546 Personal history of malignant neoplasm of prostate: Secondary | ICD-10-CM

## 2023-12-04 ENCOUNTER — Ambulatory Visit (INDEPENDENT_AMBULATORY_CARE_PROVIDER_SITE_OTHER): Admitting: Urology

## 2023-12-04 ENCOUNTER — Encounter: Payer: Self-pay | Admitting: Urology

## 2023-12-04 VITALS — BP 104/64 | HR 84 | Ht 75.0 in | Wt 249.3 lb

## 2023-12-04 DIAGNOSIS — Z8546 Personal history of malignant neoplasm of prostate: Secondary | ICD-10-CM | POA: Diagnosis not present

## 2023-12-04 DIAGNOSIS — N3941 Urge incontinence: Secondary | ICD-10-CM

## 2023-12-04 DIAGNOSIS — N3281 Overactive bladder: Secondary | ICD-10-CM

## 2023-12-04 LAB — PSA: Prostate Specific Ag, Serum: <0.1 ng/mL (ref 0.0–4.0)

## 2023-12-04 NOTE — Progress Notes (Signed)
 12/04/2023 10:25 AM   Gary DELENA Bras Sr. 1946-05-04 [id number]  Referring provider: Liana Fish, NP [address] [place],  [place] [address]  Chief Complaint  Patient presents with   Prostate Cancer   Urologic history: 1.  Prostate cancer T1c favorable intermediate risk prostate cancer Corrected PSA 8.62; prostate biopsy 1/12 cores Gleason 3+4 adenocarcinoma [facility] completed March 2018  2.  History of hypogonadism Prior TRT  3.  History epididymoorchitis Episode 04/2022  4.  Erectile dysfunction Prior PDE 5 inhibitor Currently no treatment  5.  Overactive bladder Symptoms s/p prostate cancer treatment Myrbetriq  50 mg daily    HPI: Gary STANKOVICH Sr. is a 78 y.o. male presents   No complaints since last visit No recurrent epididymitis or scrotal pain Significant improvement in OAB symptoms on Myrbetriq  50 mg daily Denies dysuria, gross hematuria No flank, abdominal or pelvic pain PSA 12/03/2023: < 0.1   PMH: Past Medical History:  Diagnosis Date   Diabetes mellitus without complication (HCC)    GERD (gastroesophageal reflux disease)    Hyperlipidemia    Hypertension     Surgical History: Past Surgical History:  Procedure Laterality Date   CARDIAC CATHETERIZATION     COLONOSCOPY WITH PROPOFOL  N/A 01/24/2020   Procedure: COLONOSCOPY WITH PROPOFOL ;  Surgeon: Therisa Bi, MD;  Location: [facility] ENDOSCOPY;  Service: Gastroenterology;  Laterality: N/A;   CORONARY/GRAFT ACUTE MI REVASCULARIZATION N/A 05/02/2018   Procedure: Coronary/Graft Acute MI Revascularization;  Surgeon: Florencio Cara BIRCH, MD;  Location: [facility] INVASIVE CV LAB;  Service: Cardiovascular;  Laterality: N/A;   HERNIA REPAIR     LEFT HEART CATH AND CORONARY ANGIOGRAPHY N/A 05/02/2018   Procedure: LEFT HEART CATH AND CORONARY ANGIOGRAPHY;  Surgeon: Florencio Cara BIRCH, MD;  Location: [facility] INVASIVE CV LAB;  Service: Cardiovascular;  Laterality: N/A;   PACEMAKER INSERTION Left  05/03/2018   Procedure: INSERTION PACEMAKER;  Surgeon: Ammon Blunt, MD;  Location: [facility] ORS;  Service: Cardiovascular;  Laterality: Left;   RIGHT/LEFT HEART CATH AND CORONARY ANGIOGRAPHY Bilateral 12/30/2021   Procedure: RIGHT/LEFT HEART CATH AND CORONARY ANGIOGRAPHY;  Surgeon: Florencio Cara BIRCH, MD;  Location: [facility] INVASIVE CV LAB;  Service: Cardiovascular;  Laterality: Bilateral;   TEMPORARY PACEMAKER N/A 05/02/2018   Procedure: TEMPORARY PACEMAKER;  Surgeon: Florencio Cara BIRCH, MD;  Location: [facility] INVASIVE CV LAB;  Service: Cardiovascular;  Laterality: N/A;   TEMPORARY PACEMAKER Right 05/03/2018   Procedure: TEMPORARY PACEMAKER REMOVAL;  Surgeon: Ammon Blunt, MD;  Location: [facility] ORS;  Service: Cardiovascular;  Laterality: Right;   TONSILLECTOMY Bilateral    1992    Home Medications:  Allergies as of 12/04/2023   No Known Allergies      Medication List        Accurate as of December 04, 2023 10:25 AM. If you have any questions, ask your nurse or doctor.          albuterol  108 (90 Base) MCG/ACT inhaler Commonly known as: VENTOLIN  HFA Inhale 1-2 puffs into the lungs every 4 (four) hours as needed for wheezing or shortness of breath. INHALE 2 INHALATIONS INTO THE LUNGS EVERY 4 HOURS AS NEEDED FOR WHEEZE OR FOR SHORTNESS OF BREATH   allopurinol  300 MG tablet Commonly known as: ZYLOPRIM  Take 1 tablet (300 mg total) by mouth daily.   amiodarone 200 MG tablet Commonly known as: PACERONE Take by mouth.   apixaban  5 MG Tabs tablet Commonly known as: ELIQUIS  Take 1 tablet (5 mg total) by mouth 2 (two) times daily.  aspirin  EC 81 MG tablet Take by mouth.   atorvastatin  40 MG tablet Commonly known as: LIPITOR Take 1 tablet (40 mg total) by mouth daily.   carvedilol  25 MG tablet Commonly known as: COREG  Take 1 tablet (25 mg total) by mouth 2 (two) times daily.   colchicine  0.6 MG tablet 2 TABS NOW THEN 1 MORE TAB 1 HOUR LATER ON FIRST DAY. THEN TAKE 1 TAB  DAILY UNTIL FLARE UP RESOLVES   diltiazem  240 MG 24 hr capsule Commonly known as: CARDIZEM  CD Take 240 mg by mouth daily.   esomeprazole  40 MG capsule Commonly known as: NEXIUM  TAKE 1 CAPSULE (40 MG TOTAL) BY MOUTH DAILY AT 12 NOON.   furosemide  40 MG tablet Commonly known as: LASIX  Take 1 tablet (40 mg total) by mouth daily.   latanoprost 0.005 % ophthalmic solution Commonly known as: XALATAN Apply 1 drop to eye.   losartan  100 MG tablet Commonly known as: COZAAR  Take 100 mg by mouth daily.   metFORMIN  750 MG 24 hr tablet Commonly known as: GLUCOPHAGE -XR TAKE 1 TABLET BY MOUTH EVERY DAY WITH BREAKFAST   mirabegron  ER 50 MG Tb24 tablet Commonly known as: Myrbetriq  Take 1 tablet (50 mg total) by mouth daily.   Simbrinza 1-0.2 % Susp Generic drug: Brinzolamide-Brimonidine Apply 1 drop to eye 3 (three) times daily.   spironolactone 25 MG tablet Commonly known as: ALDACTONE Take 12.5 mg by mouth daily.   tamsulosin  0.4 MG Caps capsule Commonly known as: FLOMAX  Take 1 capsule (0.4 mg total) by mouth daily.   timolol 0.5 % ophthalmic solution Commonly known as: TIMOPTIC 1 drop 2 (two) times daily.        Allergies: No Known Allergies  Family History: Family History  Problem Relation Age of Onset   Cirrhosis Father    Bone cancer Mother     Social History:  reports that he has quit smoking. His smoking use included cigarettes. He has never used smokeless tobacco. He reports that he does not drink alcohol and does not use drugs.   Physical Exam: BP 104/64 (BP Location: Left Arm, Patient Position: Sitting, Cuff Size: Large)   Pulse 84   Ht 6' 3 (1.905 m)   Wt 249 lb 4.8 oz (113.1 kg)   BMI 31.16 kg/m   Constitutional:  Alert and oriented, No acute distress. Respiratory: Normal respiratory effort, no increased work of breathing. Psychiatric: Normal mood and affect.   Assessment & Plan:    1.  History of prostate cancer PSA remains  undetectable Continue annual follow-up  3.  Lower urinary tract symptoms with urge incontinence Symptom improvement on Myrbetriq  50 mg Refill sent to pharmacy   Gary JAYSON Barba, MD  [facility] [address] [place], [place] [address] [phone]

## 2023-12-07 DIAGNOSIS — M9903 Segmental and somatic dysfunction of lumbar region: Secondary | ICD-10-CM | POA: Diagnosis not present

## 2023-12-07 DIAGNOSIS — M9905 Segmental and somatic dysfunction of pelvic region: Secondary | ICD-10-CM | POA: Diagnosis not present

## 2023-12-07 DIAGNOSIS — M5416 Radiculopathy, lumbar region: Secondary | ICD-10-CM | POA: Diagnosis not present

## 2023-12-07 DIAGNOSIS — M62830 Muscle spasm of back: Secondary | ICD-10-CM | POA: Diagnosis not present

## 2023-12-08 DIAGNOSIS — I495 Sick sinus syndrome: Secondary | ICD-10-CM | POA: Diagnosis not present

## 2023-12-08 DIAGNOSIS — E1165 Type 2 diabetes mellitus with hyperglycemia: Secondary | ICD-10-CM | POA: Diagnosis not present

## 2023-12-08 DIAGNOSIS — R001 Bradycardia, unspecified: Secondary | ICD-10-CM | POA: Diagnosis not present

## 2023-12-08 DIAGNOSIS — Z950 Presence of cardiac pacemaker: Secondary | ICD-10-CM | POA: Diagnosis not present

## 2023-12-08 DIAGNOSIS — I739 Peripheral vascular disease, unspecified: Secondary | ICD-10-CM | POA: Diagnosis not present

## 2023-12-08 DIAGNOSIS — I272 Pulmonary hypertension, unspecified: Secondary | ICD-10-CM | POA: Diagnosis not present

## 2023-12-08 DIAGNOSIS — R5383 Other fatigue: Secondary | ICD-10-CM | POA: Diagnosis not present

## 2023-12-08 DIAGNOSIS — I480 Paroxysmal atrial fibrillation: Secondary | ICD-10-CM | POA: Diagnosis not present

## 2023-12-08 DIAGNOSIS — E66811 Obesity, class 1: Secondary | ICD-10-CM | POA: Diagnosis not present

## 2023-12-08 DIAGNOSIS — I5032 Chronic diastolic (congestive) heart failure: Secondary | ICD-10-CM | POA: Diagnosis not present

## 2023-12-09 DIAGNOSIS — I5032 Chronic diastolic (congestive) heart failure: Secondary | ICD-10-CM | POA: Diagnosis not present

## 2023-12-10 ENCOUNTER — Other Ambulatory Visit: Payer: Self-pay | Admitting: Urology

## 2023-12-21 DIAGNOSIS — I5032 Chronic diastolic (congestive) heart failure: Secondary | ICD-10-CM | POA: Diagnosis not present

## 2023-12-25 DIAGNOSIS — M9905 Segmental and somatic dysfunction of pelvic region: Secondary | ICD-10-CM | POA: Diagnosis not present

## 2023-12-25 DIAGNOSIS — M5416 Radiculopathy, lumbar region: Secondary | ICD-10-CM | POA: Diagnosis not present

## 2023-12-25 DIAGNOSIS — M9903 Segmental and somatic dysfunction of lumbar region: Secondary | ICD-10-CM | POA: Diagnosis not present

## 2023-12-25 DIAGNOSIS — M62830 Muscle spasm of back: Secondary | ICD-10-CM | POA: Diagnosis not present

## 2023-12-28 ENCOUNTER — Other Ambulatory Visit: Payer: Self-pay | Admitting: Nurse Practitioner

## 2023-12-28 DIAGNOSIS — Z79899 Other long term (current) drug therapy: Secondary | ICD-10-CM

## 2023-12-30 DIAGNOSIS — H401132 Primary open-angle glaucoma, bilateral, moderate stage: Secondary | ICD-10-CM | POA: Diagnosis not present

## 2023-12-30 DIAGNOSIS — I739 Peripheral vascular disease, unspecified: Secondary | ICD-10-CM | POA: Diagnosis not present

## 2023-12-30 DIAGNOSIS — H2513 Age-related nuclear cataract, bilateral: Secondary | ICD-10-CM | POA: Diagnosis not present

## 2024-01-05 DIAGNOSIS — M62830 Muscle spasm of back: Secondary | ICD-10-CM | POA: Diagnosis not present

## 2024-01-05 DIAGNOSIS — M5416 Radiculopathy, lumbar region: Secondary | ICD-10-CM | POA: Diagnosis not present

## 2024-01-05 DIAGNOSIS — M9905 Segmental and somatic dysfunction of pelvic region: Secondary | ICD-10-CM | POA: Diagnosis not present

## 2024-01-05 DIAGNOSIS — M9903 Segmental and somatic dysfunction of lumbar region: Secondary | ICD-10-CM | POA: Diagnosis not present

## 2024-01-06 DIAGNOSIS — M9903 Segmental and somatic dysfunction of lumbar region: Secondary | ICD-10-CM | POA: Diagnosis not present

## 2024-01-06 DIAGNOSIS — M5416 Radiculopathy, lumbar region: Secondary | ICD-10-CM | POA: Diagnosis not present

## 2024-01-06 DIAGNOSIS — M62830 Muscle spasm of back: Secondary | ICD-10-CM | POA: Diagnosis not present

## 2024-01-06 DIAGNOSIS — M9905 Segmental and somatic dysfunction of pelvic region: Secondary | ICD-10-CM | POA: Diagnosis not present

## 2024-01-08 DIAGNOSIS — M62830 Muscle spasm of back: Secondary | ICD-10-CM | POA: Diagnosis not present

## 2024-01-08 DIAGNOSIS — M9903 Segmental and somatic dysfunction of lumbar region: Secondary | ICD-10-CM | POA: Diagnosis not present

## 2024-01-08 DIAGNOSIS — M5416 Radiculopathy, lumbar region: Secondary | ICD-10-CM | POA: Diagnosis not present

## 2024-01-08 DIAGNOSIS — M9905 Segmental and somatic dysfunction of pelvic region: Secondary | ICD-10-CM | POA: Diagnosis not present

## 2024-01-11 DIAGNOSIS — M9903 Segmental and somatic dysfunction of lumbar region: Secondary | ICD-10-CM | POA: Diagnosis not present

## 2024-01-11 DIAGNOSIS — M9905 Segmental and somatic dysfunction of pelvic region: Secondary | ICD-10-CM | POA: Diagnosis not present

## 2024-01-11 DIAGNOSIS — M62830 Muscle spasm of back: Secondary | ICD-10-CM | POA: Diagnosis not present

## 2024-01-11 DIAGNOSIS — M5416 Radiculopathy, lumbar region: Secondary | ICD-10-CM | POA: Diagnosis not present

## 2024-01-13 DIAGNOSIS — M9905 Segmental and somatic dysfunction of pelvic region: Secondary | ICD-10-CM | POA: Diagnosis not present

## 2024-01-13 DIAGNOSIS — M5416 Radiculopathy, lumbar region: Secondary | ICD-10-CM | POA: Diagnosis not present

## 2024-01-13 DIAGNOSIS — M62830 Muscle spasm of back: Secondary | ICD-10-CM | POA: Diagnosis not present

## 2024-01-13 DIAGNOSIS — M9903 Segmental and somatic dysfunction of lumbar region: Secondary | ICD-10-CM | POA: Diagnosis not present

## 2024-01-18 DIAGNOSIS — M5416 Radiculopathy, lumbar region: Secondary | ICD-10-CM | POA: Diagnosis not present

## 2024-01-18 DIAGNOSIS — M9905 Segmental and somatic dysfunction of pelvic region: Secondary | ICD-10-CM | POA: Diagnosis not present

## 2024-01-18 DIAGNOSIS — M62830 Muscle spasm of back: Secondary | ICD-10-CM | POA: Diagnosis not present

## 2024-01-18 DIAGNOSIS — M9903 Segmental and somatic dysfunction of lumbar region: Secondary | ICD-10-CM | POA: Diagnosis not present

## 2024-01-27 ENCOUNTER — Ambulatory Visit: Admitting: Nurse Practitioner

## 2024-01-28 ENCOUNTER — Telehealth: Payer: Self-pay

## 2024-01-29 MED ORDER — AZITHROMYCIN 250 MG PO TABS
ORAL_TABLET | ORAL | 0 refills | Status: AC
Start: 2024-01-29 — End: 2024-02-03

## 2024-01-29 NOTE — Telephone Encounter (Signed)
Patient sister was notified

## 2024-02-01 ENCOUNTER — Ambulatory Visit: Admitting: Nurse Practitioner

## 2024-02-04 ENCOUNTER — Ambulatory Visit: Admitting: Nurse Practitioner

## 2024-03-15 DIAGNOSIS — I5032 Chronic diastolic (congestive) heart failure: Secondary | ICD-10-CM | POA: Diagnosis not present

## 2024-03-24 ENCOUNTER — Other Ambulatory Visit: Payer: Self-pay | Admitting: Nurse Practitioner

## 2024-03-24 DIAGNOSIS — Z79899 Other long term (current) drug therapy: Secondary | ICD-10-CM

## 2024-04-28 ENCOUNTER — Other Ambulatory Visit: Payer: Self-pay | Admitting: Nurse Practitioner

## 2024-04-28 DIAGNOSIS — M1A0210 Idiopathic chronic gout, right elbow, without tophus (tophi): Secondary | ICD-10-CM

## 2024-05-12 ENCOUNTER — Other Ambulatory Visit: Payer: Self-pay

## 2024-05-12 MED ORDER — ACCU-CHEK GUIDE TEST VI STRP: ORAL_STRIP | 1 refills | Status: AC

## 2024-05-12 MED ORDER — ACCU-CHEK SOFTCLIX LANCETS MISC: 1 refills | Status: AC

## 2024-05-12 MED ORDER — ACCU-CHEK GUIDE ME W/DEVICE KIT: PACK | 0 refills | Status: AC

## 2024-10-25 ENCOUNTER — Ambulatory Visit: Admitting: Nurse Practitioner

## 2024-12-02 ENCOUNTER — Other Ambulatory Visit

## 2024-12-07 ENCOUNTER — Ambulatory Visit: Admitting: Urology
